# Patient Record
Sex: Female | Born: 1965 | Race: White | Hispanic: No | Marital: Married | Smoking: Never smoker
Health system: Southern US, Community
[De-identification: ages and names within clinical notes are randomized; demographics above are authoritative.]

## PROBLEM LIST (undated history)

## (undated) DIAGNOSIS — H15101 Unspecified episcleritis, right eye: Secondary | ICD-10-CM

## (undated) DIAGNOSIS — E785 Hyperlipidemia, unspecified: Secondary | ICD-10-CM

## (undated) DIAGNOSIS — G5603 Carpal tunnel syndrome, bilateral upper limbs: Secondary | ICD-10-CM

## (undated) DIAGNOSIS — N289 Disorder of kidney and ureter, unspecified: Secondary | ICD-10-CM

## (undated) DIAGNOSIS — K589 Irritable bowel syndrome without diarrhea: Secondary | ICD-10-CM

## (undated) DIAGNOSIS — N200 Calculus of kidney: Secondary | ICD-10-CM

## (undated) DIAGNOSIS — I10 Essential (primary) hypertension: Secondary | ICD-10-CM

## (undated) DIAGNOSIS — B029 Zoster without complications: Secondary | ICD-10-CM

## (undated) DIAGNOSIS — K219 Gastro-esophageal reflux disease without esophagitis: Secondary | ICD-10-CM

## (undated) DIAGNOSIS — U071 COVID-19: Secondary | ICD-10-CM

## (undated) DIAGNOSIS — O24419 Gestational diabetes mellitus in pregnancy, unspecified control: Secondary | ICD-10-CM

## (undated) DIAGNOSIS — K760 Fatty (change of) liver, not elsewhere classified: Secondary | ICD-10-CM

## (undated) DIAGNOSIS — T7840XA Allergy, unspecified, initial encounter: Secondary | ICD-10-CM

## (undated) DIAGNOSIS — M7500 Adhesive capsulitis of unspecified shoulder: Secondary | ICD-10-CM

## (undated) DIAGNOSIS — C439 Malignant melanoma of skin, unspecified: Secondary | ICD-10-CM

## (undated) DIAGNOSIS — E669 Obesity, unspecified: Secondary | ICD-10-CM

## (undated) HISTORY — DX: Carpal tunnel syndrome, bilateral upper limbs: G56.03

## (undated) HISTORY — DX: Gastro-esophageal reflux disease without esophagitis: K21.9

## (undated) HISTORY — DX: Irritable bowel syndrome, unspecified: K58.9

## (undated) HISTORY — PX: MELANOMA EXCISION: SHX5266

## (undated) HISTORY — PX: LITHOTRIPSY: SUR834

## (undated) HISTORY — DX: Gestational diabetes mellitus in pregnancy, unspecified control: O24.419

## (undated) HISTORY — DX: COVID-19: U07.1

## (undated) HISTORY — DX: Zoster without complications: B02.9

## (undated) HISTORY — DX: Unspecified episcleritis, right eye: H15.101

## (undated) HISTORY — DX: Obesity, unspecified: E66.9

## (undated) HISTORY — DX: Hyperlipidemia, unspecified: E78.5

## (undated) HISTORY — PX: CARPAL TUNNEL RELEASE: SHX101

## (undated) HISTORY — DX: Allergy, unspecified, initial encounter: T78.40XA

## (undated) HISTORY — DX: Adhesive capsulitis of unspecified shoulder: M75.00

## (undated) HISTORY — DX: Essential (primary) hypertension: I10

## (undated) HISTORY — DX: Calculus of kidney: N20.0

## (undated) HISTORY — PX: BREAST REDUCTION SURGERY: SHX8

## (undated) HISTORY — DX: Fatty (change of) liver, not elsewhere classified: K76.0

## (undated) HISTORY — PX: NASAL SINUS SURGERY: SHX719

## (undated) HISTORY — PX: TONSILLECTOMY: SUR1361

---

## 2005-06-19 ENCOUNTER — Ambulatory Visit: Payer: Self-pay | Admitting: Oncology

## 2005-06-25 ENCOUNTER — Ambulatory Visit: Payer: Self-pay | Admitting: Oncology

## 2005-06-27 ENCOUNTER — Ambulatory Visit: Payer: Self-pay | Admitting: Oncology

## 2005-07-01 ENCOUNTER — Ambulatory Visit: Payer: Self-pay | Admitting: Oncology

## 2005-07-18 ENCOUNTER — Ambulatory Visit: Payer: Self-pay | Admitting: Surgery

## 2005-08-20 ENCOUNTER — Ambulatory Visit: Payer: Self-pay | Admitting: Oncology

## 2005-09-08 ENCOUNTER — Ambulatory Visit: Payer: Self-pay | Admitting: Surgery

## 2005-09-25 ENCOUNTER — Ambulatory Visit: Payer: Self-pay | Admitting: Oncology

## 2006-02-13 ENCOUNTER — Ambulatory Visit: Payer: Self-pay | Admitting: Oncology

## 2006-02-19 ENCOUNTER — Ambulatory Visit: Payer: Self-pay | Admitting: Oncology

## 2006-03-20 ENCOUNTER — Ambulatory Visit: Payer: Self-pay | Admitting: Oncology

## 2006-08-21 ENCOUNTER — Ambulatory Visit: Payer: Self-pay | Admitting: Oncology

## 2006-08-27 ENCOUNTER — Ambulatory Visit: Payer: Self-pay | Admitting: Oncology

## 2006-09-19 ENCOUNTER — Ambulatory Visit: Payer: Self-pay | Admitting: Oncology

## 2007-02-23 ENCOUNTER — Ambulatory Visit: Payer: Self-pay | Admitting: Oncology

## 2007-02-23 ENCOUNTER — Ambulatory Visit: Payer: Self-pay | Admitting: Internal Medicine

## 2007-03-21 ENCOUNTER — Ambulatory Visit: Payer: Self-pay | Admitting: Oncology

## 2007-03-21 ENCOUNTER — Ambulatory Visit: Payer: Self-pay | Admitting: Internal Medicine

## 2007-09-14 ENCOUNTER — Ambulatory Visit: Payer: Self-pay | Admitting: Internal Medicine

## 2007-10-21 ENCOUNTER — Ambulatory Visit: Payer: Self-pay | Admitting: Oncology

## 2007-10-27 ENCOUNTER — Ambulatory Visit: Payer: Self-pay | Admitting: Oncology

## 2007-11-02 ENCOUNTER — Ambulatory Visit: Payer: Self-pay | Admitting: Oncology

## 2007-11-21 ENCOUNTER — Ambulatory Visit: Payer: Self-pay | Admitting: Oncology

## 2007-11-25 ENCOUNTER — Ambulatory Visit: Payer: Self-pay | Admitting: Gastroenterology

## 2007-11-29 ENCOUNTER — Ambulatory Visit: Payer: Self-pay | Admitting: Gastroenterology

## 2008-03-20 ENCOUNTER — Ambulatory Visit: Payer: Self-pay | Admitting: Oncology

## 2008-04-18 ENCOUNTER — Ambulatory Visit: Payer: Self-pay | Admitting: Oncology

## 2008-04-19 ENCOUNTER — Ambulatory Visit: Payer: Self-pay | Admitting: Oncology

## 2008-04-24 ENCOUNTER — Ambulatory Visit: Payer: Self-pay | Admitting: Oncology

## 2008-05-20 ENCOUNTER — Ambulatory Visit: Payer: Self-pay | Admitting: Oncology

## 2008-10-20 ENCOUNTER — Ambulatory Visit: Payer: Self-pay | Admitting: Oncology

## 2008-11-07 ENCOUNTER — Ambulatory Visit: Payer: Self-pay | Admitting: Oncology

## 2008-11-14 ENCOUNTER — Ambulatory Visit: Payer: Self-pay | Admitting: Oncology

## 2008-11-20 ENCOUNTER — Ambulatory Visit: Payer: Self-pay | Admitting: Oncology

## 2008-12-27 ENCOUNTER — Ambulatory Visit: Payer: Self-pay | Admitting: Unknown Physician Specialty

## 2009-11-22 IMAGING — CT NM PET TUM IMG RESTAG (PS) SKULL BASE T - THIGH
5 series · 25 of 25 positions shown · non-contrast
Comparison: none

REASON FOR EXAM: Melanoma  Rt Upper Extr
COMMENTS:

PROCEDURE:     PET - PET/CT MELANOMA RESTG WB  - November 07, 2008  [DATE]
RESULT:
HISTORY: Melanoma.

[Series 4: ct wb 3.0 b30f · axial · 3.0mm · 0.98mm/px · z∈[-1254,-268]mm · 11 of 494 slices shown]
[im 1/494]
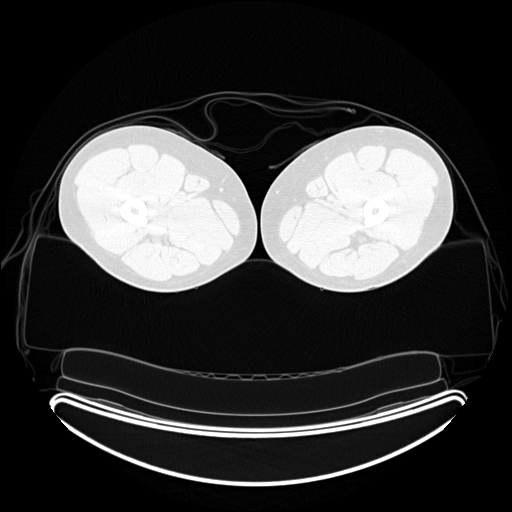
[im 50/494]
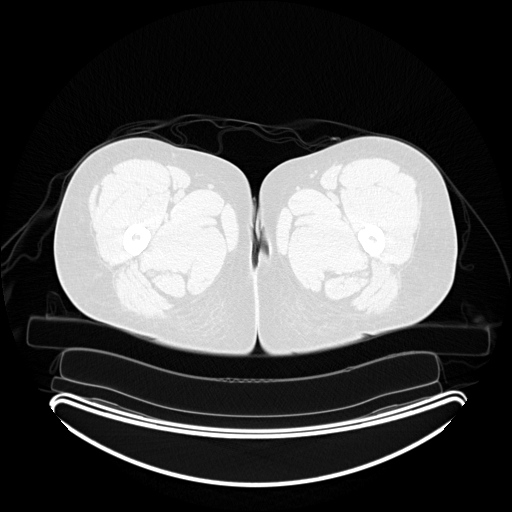
[im 99/494]
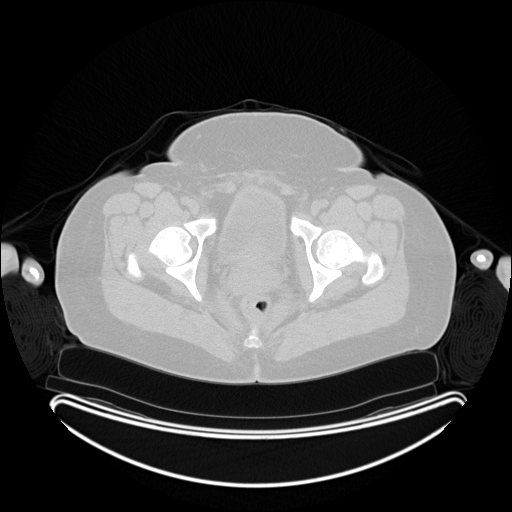
[im 148/494]
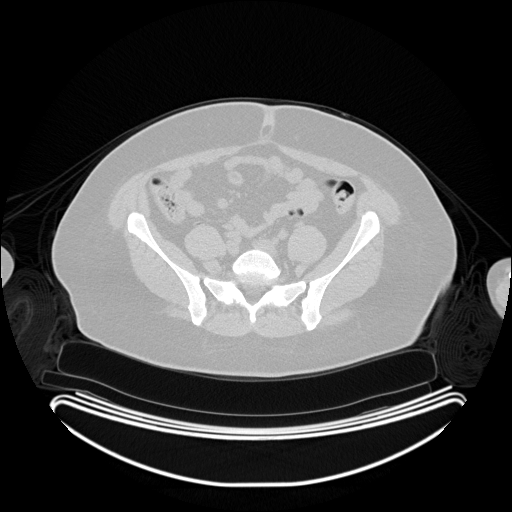
[im 198/494]
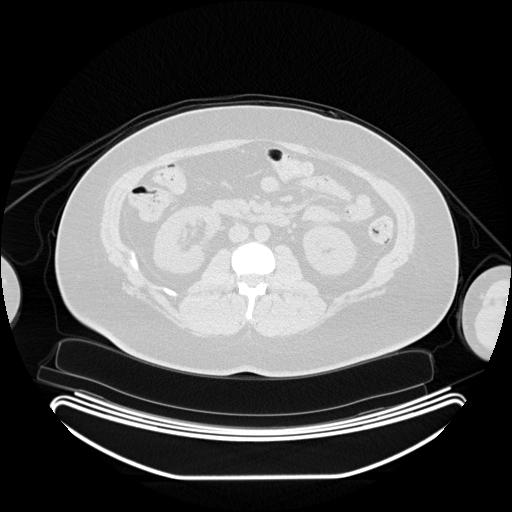
[im 247/494]
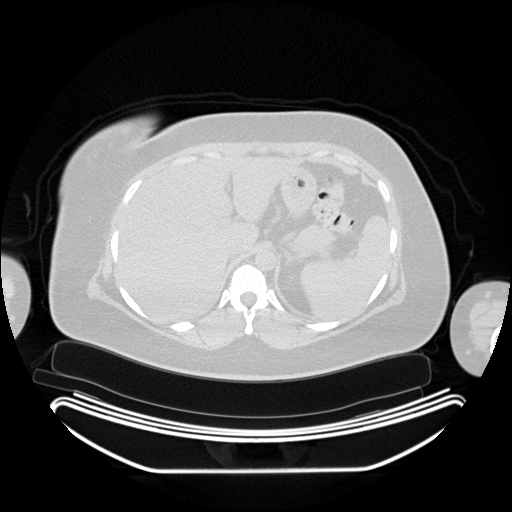
[im 296/494]
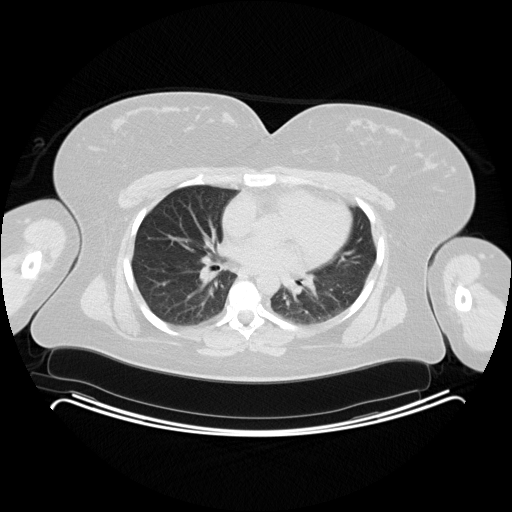
[im 346/494]
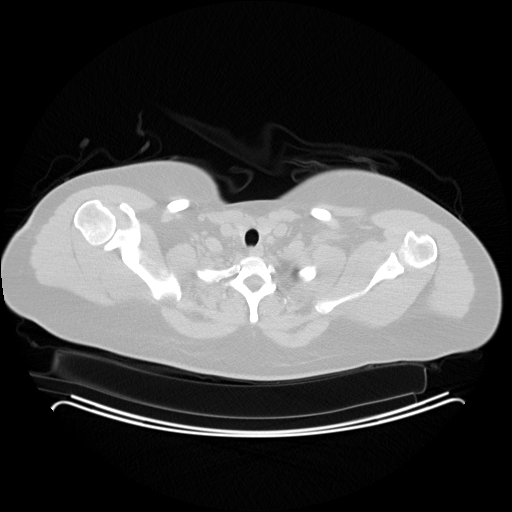
[im 395/494]
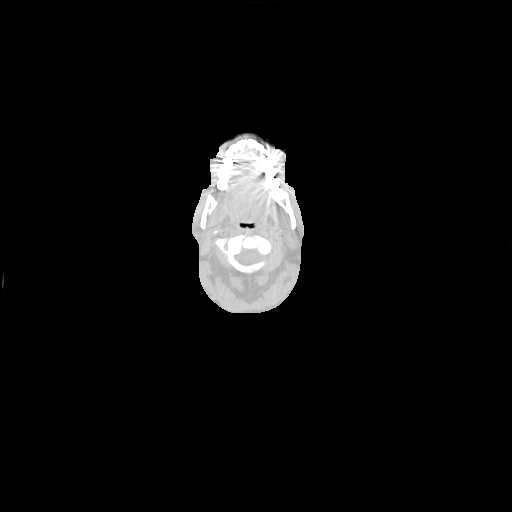
[im 444/494  brain]
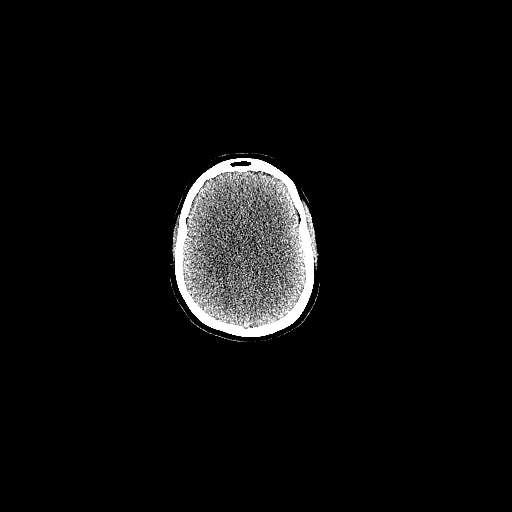
[im 494/494]
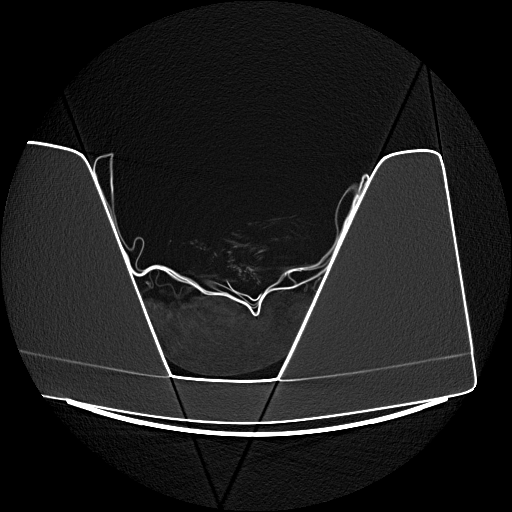

[Series 102: pet wb · axial · 5.0mm · 4.07mm/px · z∈[-1252,-268]mm · 7 of 329 slices shown]
[im 1/329]
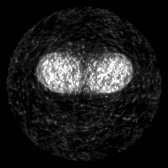
[im 55/329]
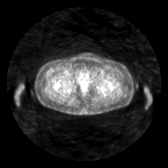
[im 110/329]
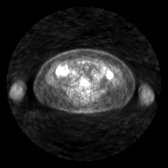
[im 165/329]
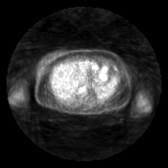
[im 219/329]
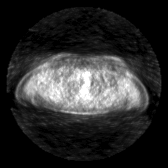
[im 274/329]
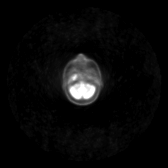
[im 329/329]
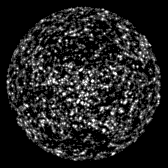

[Series 603: pet axial · 4 of 184 slices shown]
[im 1/184]
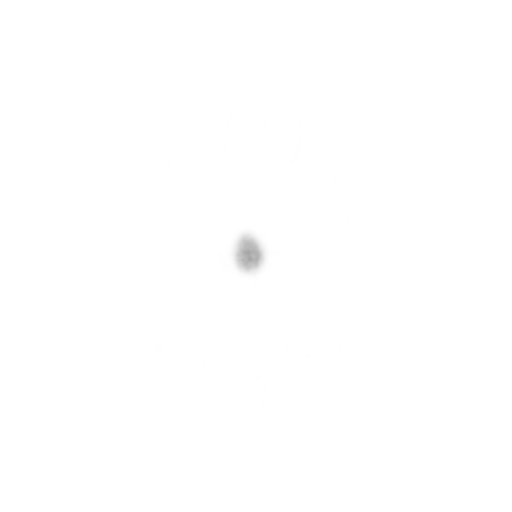
[im 62/184]
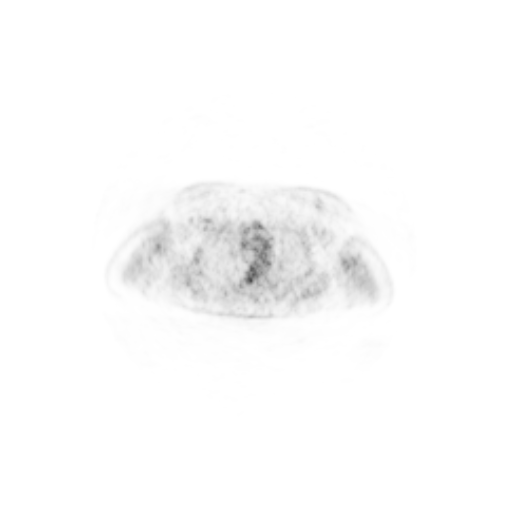
[im 123/184]
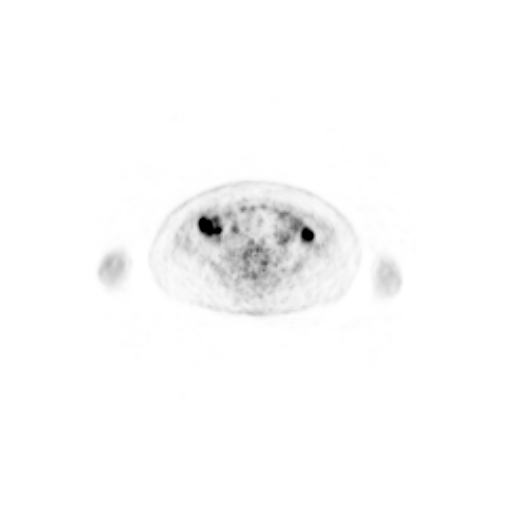
[im 184/184]
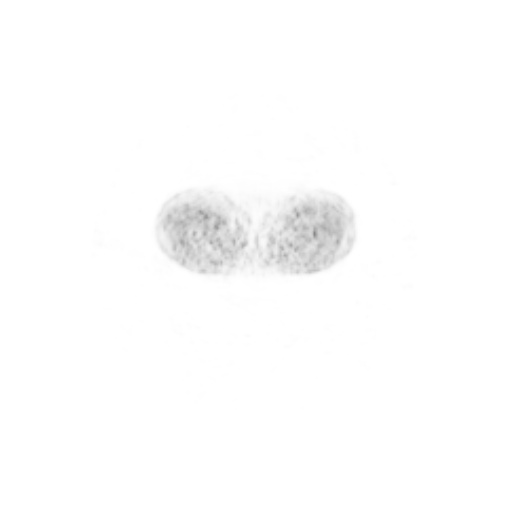

[Series 604: pet coronal · 1 of 52 slices shown]
[im 1/52]
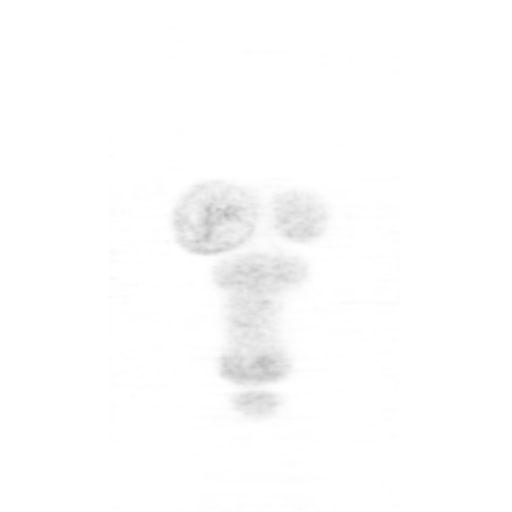

[Series 605: pet sagittal · 2 of 116 slices shown]
[im 1/116]
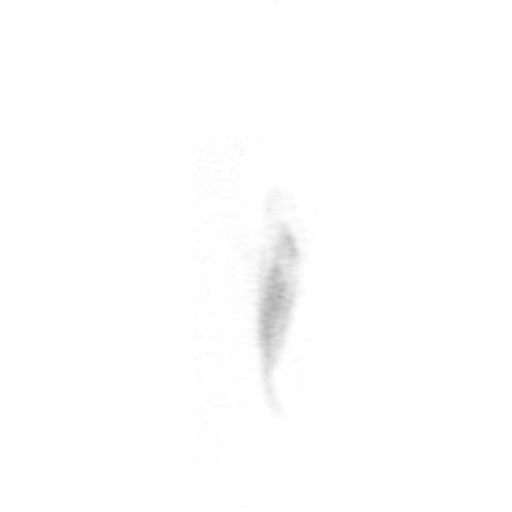
[im 116/116]
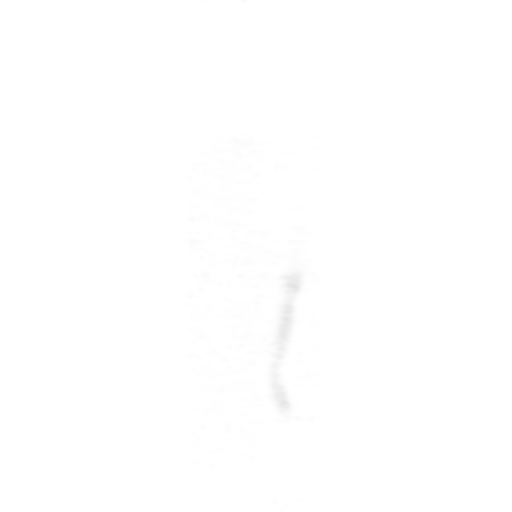

[25 of 25 positions shown; findings below may reference images not displayed]

COMPARISON STUDIES:  PET CT of 10/27/07.

PROCEDURE AND FINDINGS:   Following determination of fasting blood sugar of
93 mg/dl, 13.4 mCi of F18 FDG was administered to the patient and PET CT was
obtained.  Neck, chest, abdomen and pelvis are unremarkable. Lower
extremities are unremarkable.

Normal bowel activity is noted.
IMPRESSION: Normal exam.

## 2009-12-31 ENCOUNTER — Ambulatory Visit: Payer: Self-pay | Admitting: Unknown Physician Specialty

## 2011-01-14 ENCOUNTER — Ambulatory Visit: Payer: Self-pay | Admitting: Family Medicine

## 2012-09-20 ENCOUNTER — Ambulatory Visit: Payer: Self-pay | Admitting: Medical

## 2013-12-06 ENCOUNTER — Ambulatory Visit: Payer: Self-pay | Admitting: Physician Assistant

## 2014-11-03 ENCOUNTER — Ambulatory Visit: Payer: Self-pay

## 2015-12-12 ENCOUNTER — Encounter: Payer: Self-pay | Admitting: Emergency Medicine

## 2015-12-12 ENCOUNTER — Ambulatory Visit
Admission: EM | Admit: 2015-12-12 | Discharge: 2015-12-12 | Disposition: A | Discharge status: Home / Self Care | Payer: BLUE CROSS/BLUE SHIELD | Attending: Family Medicine | Admitting: Family Medicine

## 2015-12-12 DIAGNOSIS — B349 Viral infection, unspecified: Secondary | ICD-10-CM | POA: Diagnosis not present

## 2015-12-12 HISTORY — DX: Malignant melanoma of skin, unspecified: C43.9

## 2015-12-12 HISTORY — DX: Disorder of kidney and ureter, unspecified: N28.9

## 2015-12-12 LAB — RAPID INFLUENZA A&B ANTIGENS
Influenza A (ARMC): NOT DETECTED
Influenza B (ARMC): NOT DETECTED

## 2015-12-12 LAB — RAPID STREP SCREEN (MED CTR MEBANE ONLY): Streptococcus, Group A Screen (Direct): NEGATIVE

## 2015-12-12 MED ORDER — HYDROCOD POLST-CPM POLST ER 10-8 MG/5ML PO SUER: 5.0000 mL | Freq: Two times a day (BID) | ORAL | Status: DC

## 2015-12-12 MED ORDER — OSELTAMIVIR PHOSPHATE 75 MG PO CAPS: 75.0000 mg | ORAL_CAPSULE | Freq: Two times a day (BID) | ORAL | Status: DC

## 2015-12-12 NOTE — ED Provider Notes (Signed)
CSN: [id number]     Arrival date & time 12/12/15  1326 History   First MD Initiated Contact with Patient 12/12/15 1515     Chief Complaint  Patient presents with  . Sore Throat  . Headache   (Consider location/radiation/quality/duration/timing/severity/associated sxs/prior Treatment) Patient is a 50 y.o. female presenting with URI. The history is provided by the patient.  URI Presenting symptoms: congestion, cough, fatigue, fever and sore throat   Severity:  Moderate Onset quality:  Sudden Duration:  1 day Timing:  Constant Progression:  Worsening Chronicity:  New Relieved by:  None tried Associated symptoms: arthralgias, headaches and myalgias   Associated symptoms: no neck pain, no sinus pain, no swollen glands and no wheezing   Risk factors: sick contacts   Risk factors: not elderly, no chronic cardiac disease, no chronic kidney disease, no chronic respiratory disease, no diabetes mellitus, no immunosuppression, no recent illness and no recent travel     Past Medical History  Diagnosis Date  . Melanoma ([place])   . Renal disorder    Past Surgical History  Procedure Laterality Date  . Cesarean section    . Tonsillectomy    . Nasal sinus surgery     History reviewed. No pertinent family history. Social History  Substance Use Topics  . Smoking status: Never Smoker   . Smokeless tobacco: None  . Alcohol Use: No   OB History    No data available     Review of Systems  Constitutional: Positive for fever and fatigue.  HENT: Positive for congestion and sore throat.   Respiratory: Positive for cough. Negative for wheezing.   Musculoskeletal: Positive for myalgias and arthralgias. Negative for neck pain.  Neurological: Positive for headaches.    Allergies  Review of patient's allergies indicates no known allergies.  Home Medications   Prior to Admission medications   Medication Sig Start Date End Date Taking? Authorizing Provider  cetirizine (ZYRTEC) 10 MG tablet  Take 10 mg by mouth daily.   Yes Historical Provider, MD  Fluoxetine HCl, PMDD, (SARAFEM) 10 MG TABS Take 1 tablet by mouth daily.   Yes Historical Provider, MD  Multiple Vitamin (MULTIVITAMIN) tablet Take 1 tablet by mouth daily.   Yes Historical Provider, MD  omega-3 acid ethyl esters (LOVAZA) 1 g capsule Take 1 g by mouth 2 (two) times daily.   Yes Historical Provider, MD  chlorpheniramine-HYDROcodone (TUSSIONEX PENNKINETIC ER) 10-8 MG/5ML SUER Take 5 mLs by mouth 2 (two) times daily. 12/12/15   Norval Gable, MD  oseltamivir (TAMIFLU) 75 MG capsule Take 1 capsule (75 mg total) by mouth 2 (two) times daily. 12/12/15   Norval Gable, MD   Meds Ordered and Administered this Visit  Medications - No data to display  BP 129/75 mmHg  Pulse 115  Temp(Src) 100.4 F (38 C) (Tympanic)  Resp 16  Ht 5' (1.524 m)  Wt 176 lb (79.833 kg)  BMI 34.37 kg/m2  SpO2 97%  LMP 11/24/2015 (Exact Date) No data found.   Physical Exam  Constitutional: She appears well-developed and well-nourished. No distress.  HENT:  Head: Normocephalic and atraumatic.  Right Ear: Tympanic membrane, external ear and ear canal normal.  Left Ear: Tympanic membrane, external ear and ear canal normal.  Nose: Rhinorrhea present. No nose lacerations, sinus tenderness, nasal deformity, septal deviation or nasal septal hematoma. No epistaxis.  No foreign bodies.  Mouth/Throat: Uvula is midline and mucous membranes are normal. Posterior oropharyngeal erythema present. No oropharyngeal exudate, posterior oropharyngeal edema or tonsillar  abscesses.  Eyes: Conjunctivae and EOM are normal. Pupils are equal, round, and reactive to light. Right eye exhibits no discharge. Left eye exhibits no discharge. No scleral icterus.  Neck: Normal range of motion. Neck supple. No thyromegaly present.  Cardiovascular: Normal rate, regular rhythm and normal heart sounds.   Pulmonary/Chest: Effort normal and breath sounds normal. No respiratory  distress. She has no wheezes. She has no rales.  Lymphadenopathy:    She has no cervical adenopathy.  Skin: She is not diaphoretic.  Nursing note and vitals reviewed.   ED Course  Procedures (including critical care time)  Labs Review Labs Reviewed  RAPID INFLUENZA A&B ANTIGENS ([facility] ONLY)  RAPID STREP SCREEN (NOT AT [facility])    Imaging Review No results found.   Visual Acuity Review  Right Eye Distance:   Left Eye Distance:   Bilateral Distance:    Right Eye Near:   Left Eye Near:    Bilateral Near:         MDM   1. Viral syndrome   (likely influenza, based on clinical presentation)   New Prescriptions   CHLORPHENIRAMINE-HYDROCODONE (TUSSIONEX PENNKINETIC ER) 10-8 MG/5ML SUER    Take 5 mLs by mouth 2 (two) times daily.   OSELTAMIVIR (TAMIFLU) 75 MG CAPSULE    Take 1 capsule (75 mg total) by mouth 2 (two) times daily.   1. Lab results and diagnosis reviewed with patient 2. rx as per orders above; reviewed possible side effects, interactions, risks and benefits  3. Recommend supportive treatment with rest, increased fluids, otc analgesics 4. Follow-up prn if symptoms worsen or don't improve    Norval Gable, MD 12/12/15 1531

## 2015-12-12 NOTE — ED Notes (Signed)
Patient sore throat, HAs, fever, and bodyaches that started last night.

## 2016-08-08 ENCOUNTER — Encounter: Payer: Self-pay | Admitting: Emergency Medicine

## 2016-08-08 ENCOUNTER — Ambulatory Visit
Admission: EM | Admit: 2016-08-08 | Discharge: 2016-08-08 | Disposition: A | Discharge status: Home / Self Care | Payer: Managed Care, Other (non HMO) | Attending: Family Medicine | Admitting: Family Medicine

## 2016-08-08 DIAGNOSIS — J069 Acute upper respiratory infection, unspecified: Secondary | ICD-10-CM

## 2016-08-08 DIAGNOSIS — H6502 Acute serous otitis media, left ear: Secondary | ICD-10-CM | POA: Diagnosis not present

## 2016-08-08 MED ORDER — HYDROCOD POLST-CPM POLST ER 10-8 MG/5ML PO SUER: 5.0000 mL | Freq: Two times a day (BID) | ORAL | 0 refills | Status: DC | PRN

## 2016-08-08 MED ORDER — AMOXICILLIN 875 MG PO TABS: 875.0000 mg | ORAL_TABLET | Freq: Two times a day (BID) | ORAL | 0 refills | Status: DC

## 2016-08-08 NOTE — ED Provider Notes (Signed)
[facility]    CSN: [id number] Arrival date & time: 08/08/16  0903     History   Chief Complaint Chief Complaint  Patient presents with  . Facial Pain  . Cough    HPI Angela Simon is a 50 y.o. female.   The history is provided by the patient.  Cough  Associated symptoms: ear pain and fever   Associated symptoms: no headaches, no myalgias and no wheezing   URI  Presenting symptoms: congestion, cough, ear pain and fever   Severity:  Moderate Onset quality:  Sudden Duration:  5 days Timing:  Constant Progression:  Worsening Chronicity:  New Relieved by:  Nothing Ineffective treatments:  OTC medications Associated symptoms: no arthralgias, no headaches, no myalgias, no neck pain, no sinus pain, no sneezing, no swollen glands and no wheezing   Risk factors: sick contacts   Risk factors: not elderly, no chronic cardiac disease, no chronic kidney disease, no chronic respiratory disease, no diabetes mellitus, no immunosuppression, no recent illness and no recent travel     Past Medical History:  Diagnosis Date  . Melanoma ([place])   . Renal disorder     There are no active problems to display for this patient.   Past Surgical History:  Procedure Laterality Date  . CESAREAN SECTION    . NASAL SINUS SURGERY    . TONSILLECTOMY      OB History    No data available       Home Medications    Prior to Admission medications   Medication Sig Start Date End Date Taking? Authorizing Provider  amoxicillin (AMOXIL) 875 MG tablet Take 1 tablet (875 mg total) by mouth 2 (two) times daily. 08/08/16   Norval Gable, MD  cetirizine (ZYRTEC) 10 MG tablet Take 10 mg by mouth daily.    Historical Provider, MD  chlorpheniramine-HYDROcodone (TUSSIONEX PENNKINETIC ER) 10-8 MG/5ML SUER Take 5 mLs by mouth every 12 (twelve) hours as needed. 08/08/16   Norval Gable, MD  Fluoxetine HCl, PMDD, (SARAFEM) 10 MG TABS Take 1 tablet by mouth daily.    Historical Provider,  MD  Multiple Vitamin (MULTIVITAMIN) tablet Take 1 tablet by mouth daily.    Historical Provider, MD  omega-3 acid ethyl esters (LOVAZA) 1 g capsule Take 1 g by mouth 2 (two) times daily.    Historical Provider, MD  oseltamivir (TAMIFLU) 75 MG capsule Take 1 capsule (75 mg total) by mouth 2 (two) times daily. 12/12/15   Norval Gable, MD    Family History History reviewed. No pertinent family history.  Social History Social History  Substance Use Topics  . Smoking status: Never Smoker  . Smokeless tobacco: Never Used  . Alcohol use No     Allergies   Review of patient's allergies indicates no known allergies.   Review of Systems Review of Systems  Constitutional: Positive for fever.  HENT: Positive for congestion and ear pain. Negative for sneezing.   Respiratory: Positive for cough. Negative for wheezing.   Musculoskeletal: Negative for arthralgias, myalgias and neck pain.  Neurological: Negative for headaches.     Physical Exam Triage Vital Signs ED Triage Vitals  Enc Vitals Group     BP 08/08/16 0940 (!) 150/85     Pulse Rate 08/08/16 0940 83     Resp 08/08/16 0940 16     Temp 08/08/16 0940 99.3 F (37.4 C)     Temp Source 08/08/16 0940 Tympanic     SpO2 08/08/16 0940 100 %  Weight 08/08/16 0939 175 lb (79.4 kg)     Height 08/08/16 0939 5' (1.524 m)     Head Circumference --      Peak Flow --      Pain Score 08/08/16 0941 5     Pain Loc --      Pain Edu? --      Excl. in [place]? --    No data found.   Updated Vital Signs BP (!) 150/85 (BP Location: Left Arm)   Pulse 83   Temp 99.3 F (37.4 C) (Tympanic)   Resp 16   Ht 5' (1.524 m)   Wt 175 lb (79.4 kg)   LMP 07/14/2016   SpO2 100%   BMI 34.18 kg/m   Visual Acuity Right Eye Distance:   Left Eye Distance:   Bilateral Distance:    Right Eye Near:   Left Eye Near:    Bilateral Near:     Physical Exam  Constitutional: She appears well-developed and well-nourished. No distress.  HENT:  Head:  Normocephalic and atraumatic.  Right Ear: Tympanic membrane, external ear and ear canal normal.  Left Ear: External ear and ear canal normal. Tympanic membrane is injected, erythematous and bulging.  Nose: No mucosal edema, rhinorrhea, nose lacerations, sinus tenderness, nasal deformity, septal deviation or nasal septal hematoma. No epistaxis.  No foreign bodies. Right sinus exhibits no maxillary sinus tenderness and no frontal sinus tenderness. Left sinus exhibits no maxillary sinus tenderness and no frontal sinus tenderness.  Mouth/Throat: Uvula is midline, oropharynx is clear and moist and mucous membranes are normal. No oropharyngeal exudate.  Eyes: Conjunctivae and EOM are normal. Pupils are equal, round, and reactive to light. Right eye exhibits no discharge. Left eye exhibits no discharge. No scleral icterus.  Neck: Normal range of motion. Neck supple. No thyromegaly present.  Cardiovascular: Normal rate, regular rhythm and normal heart sounds.   Pulmonary/Chest: Effort normal and breath sounds normal. No respiratory distress. She has no wheezes. She has no rales.  Lymphadenopathy:    She has no cervical adenopathy.  Skin: She is not diaphoretic.  Nursing note and vitals reviewed.    UC Treatments / Results  Labs (all labs ordered are listed, but only abnormal results are displayed) Labs Reviewed - No data to display  EKG  EKG Interpretation None       Radiology No results found.  Procedures Procedures (including critical care time)  Medications Ordered in UC Medications - No data to display   Initial Impression / Assessment and Plan / UC Course  I have reviewed the triage vital signs and the nursing notes.  Pertinent labs & imaging results that were available during my care of the patient were reviewed by me and considered in my medical decision making (see chart for details).  Clinical Course      Final Clinical Impressions(s) / UC Diagnoses   Final  diagnoses:  Acute serous otitis media of left ear, recurrence not specified  Acute upper respiratory infection    New Prescriptions Discharge Medication List as of 08/08/2016 10:49 AM    START taking these medications   Details  amoxicillin (AMOXIL) 875 MG tablet Take 1 tablet (875 mg total) by mouth 2 (two) times daily., Starting Fri 08/08/2016, Normal    chlorpheniramine-HYDROcodone (TUSSIONEX PENNKINETIC ER) 10-8 MG/5ML SUER Take 5 mLs by mouth every 12 (twelve) hours as needed., Starting Fri 08/08/2016, Normal       1. diagnosis reviewed with patient 2. rx as per orders  above; reviewed possible side effects, interactions, risks and benefits  3. Follow-up prn if symptoms worsen or don't improve   Norval Gable, MD 08/08/16 1104

## 2016-08-08 NOTE — ED Triage Notes (Signed)
Patient c/o cough, chest congestion, sinus pressure for 2 days. Patient reports low grade fevers.

## 2016-08-12 ENCOUNTER — Telehealth: Payer: Self-pay

## 2016-08-12 NOTE — Telephone Encounter (Signed)
Courtesy call back completed today after patient's visit at [facility]. Patient improved and will call back with any questions or concerns.  

## 2016-11-28 ENCOUNTER — Ambulatory Visit (INDEPENDENT_AMBULATORY_CARE_PROVIDER_SITE_OTHER): Payer: Managed Care, Other (non HMO) | Admitting: Primary Care

## 2016-11-28 ENCOUNTER — Encounter: Payer: Self-pay | Admitting: Primary Care

## 2016-11-28 VITALS — BP 152/98 | HR 84 | Temp 98.0°F | Ht 60.0 in | Wt 175.8 lb

## 2016-11-28 DIAGNOSIS — F411 Generalized anxiety disorder: Secondary | ICD-10-CM | POA: Diagnosis not present

## 2016-11-28 DIAGNOSIS — I10 Essential (primary) hypertension: Secondary | ICD-10-CM | POA: Insufficient documentation

## 2016-11-28 DIAGNOSIS — K219 Gastro-esophageal reflux disease without esophagitis: Secondary | ICD-10-CM

## 2016-11-28 MED ORDER — LISINOPRIL 10 MG PO TABS: 10.0000 mg | ORAL_TABLET | Freq: Every day | ORAL | 0 refills | Status: DC

## 2016-11-28 NOTE — Progress Notes (Signed)
Pre visit review using our clinic review tool, if applicable. No additional management support is needed unless otherwise documented below in the visit note. 

## 2016-11-28 NOTE — Patient Instructions (Signed)
Start lisinopril 10 mg for high blood pressure. Take 1 tablet by mouth once daily.  Check your blood pressure daily, around the same time of day, for the next 3 weeks.  Ensure that you have rested for 30 minutes prior to checking your blood pressure. Record your readings and bring them to your next visit.  Schedule a follow up appointment in 3 weeks for re-evaluation of your blood pressure.  It was a pleasure to meet you today! Please don't hesitate to call me with any questions. Welcome to [facility]!  DASH Eating Plan DASH stands for "Dietary Approaches to Stop Hypertension." The DASH eating plan is a healthy eating plan that has been shown to reduce high blood pressure (hypertension). Additional health benefits may include reducing the risk of type 2 diabetes mellitus, heart disease, and stroke. The DASH eating plan may also help with weight loss. What do I need to know about the DASH eating plan? For the DASH eating plan, you will follow these general guidelines:  Choose foods with less than 150 milligrams of sodium per serving (as listed on the food label).  Use salt-free seasonings or herbs instead of table salt or sea salt.  Check with your health care provider or pharmacist before using salt substitutes.  Eat lower-sodium products. These are often labeled as "low-sodium" or "no salt added."  Eat fresh foods. Avoid eating a lot of canned foods.  Eat more vegetables, fruits, and low-fat dairy products.  Choose whole grains. Look for the word "whole" as the first word in the ingredient list.  Choose fish and skinless chicken or [place] more often than red meat. Limit fish, poultry, and meat to 6 oz (170 g) each day.  Limit sweets, desserts, sugars, and sugary drinks.  Choose heart-healthy fats.  Eat more home-cooked food and less restaurant, buffet, and fast food.  Limit fried foods.  Do not fry foods. Cook foods using methods such as baking, boiling, grilling, and broiling  instead.  When eating at a restaurant, ask that your food be prepared with less salt, or no salt if possible. What foods can I eat? Seek help from a dietitian for individual calorie needs. Grains  Whole grain or whole wheat bread. Brown rice. Whole grain or whole wheat pasta. Quinoa, bulgur, and whole grain cereals. Low-sodium cereals. Corn or whole wheat flour tortillas. Whole grain cornbread. Whole grain crackers. Low-sodium crackers. Vegetables  Fresh or frozen vegetables (raw, steamed, roasted, or grilled). Low-sodium or reduced-sodium tomato and vegetable juices. Low-sodium or reduced-sodium tomato sauce and paste. Low-sodium or reduced-sodium canned vegetables. Fruits  All fresh, canned (in natural juice), or frozen fruits. Meat and Other Protein Products  Ground beef (85% or leaner), grass-fed beef, or beef trimmed of fat. Skinless chicken or [place]. Ground chicken or [place]. Pork trimmed of fat. All fish and seafood. Eggs. Dried beans, peas, or lentils. Unsalted nuts and seeds. Unsalted canned beans. Dairy  Low-fat dairy products, such as skim or 1% milk, 2% or reduced-fat cheeses, low-fat ricotta or cottage cheese, or plain low-fat yogurt. Low-sodium or reduced-sodium cheeses. Fats and Oils  Tub margarines without trans fats. Light or reduced-fat mayonnaise and salad dressings (reduced sodium). Avocado. Safflower, olive, or canola oils. Natural peanut or almond butter. Other  Unsalted popcorn and pretzels. The items listed above may not be a complete list of recommended foods or beverages. Contact your dietitian for more options.  What foods are not recommended? Grains  White bread. White pasta. White rice. Refined cornbread. Bagels  and croissants. Crackers that contain trans fat. Vegetables  Creamed or fried vegetables. Vegetables in a cheese sauce. Regular canned vegetables. Regular canned tomato sauce and paste. Regular tomato and vegetable juices. Fruits  Canned fruit in light  or heavy syrup. Fruit juice. Meat and Other Protein Products  Fatty cuts of meat. Ribs, chicken wings, bacon, sausage, bologna, salami, chitterlings, fatback, hot dogs, bratwurst, and packaged luncheon meats. Salted nuts and seeds. Canned beans with salt. Dairy  Whole or 2% milk, cream, half-and-half, and cream cheese. Whole-fat or sweetened yogurt. Full-fat cheeses or blue cheese. Nondairy creamers and whipped toppings. Processed cheese, cheese spreads, or cheese curds. Condiments  Onion and garlic salt, seasoned salt, table salt, and sea salt. Canned and packaged gravies. Worcestershire sauce. Tartar sauce. Barbecue sauce. Teriyaki sauce. Soy sauce, including reduced sodium. Steak sauce. Fish sauce. Oyster sauce. Cocktail sauce. Horseradish. Ketchup and mustard. Meat flavorings and tenderizers. Bouillon cubes. Hot sauce. Tabasco sauce. Marinades. Taco seasonings. Relishes. Fats and Oils  Butter, stick margarine, lard, shortening, ghee, and bacon fat. Coconut, palm kernel, or palm oils. Regular salad dressings. Other  Pickles and olives. Salted popcorn and pretzels. The items listed above may not be a complete list of foods and beverages to avoid. Contact your dietitian for more information.  Where can I find more information? National Heart, Lung, and Blood Institute: travelstabloid.com This information is not intended to replace advice given to you by your health care provider. Make sure you discuss any questions you have with your health care provider. Document Released: 09/25/2011 Document Revised: 03/13/2016 Document Reviewed: 08/10/2013 Elsevier Interactive Patient Education  2017 [employer].

## 2016-11-28 NOTE — Progress Notes (Signed)
Subjective:    Patient ID: Angela Simon, female    DOB: 1965-11-08, 51 y.o.   MRN: [id number]  HPI  Angela Simon is a 51 year old female who presents today to establish care and discuss the problems mentioned below. Will obtain old records.  1) Elevated Blood Pressure: She's been checking her BP at home/at [employer] for the past 2 weeks. Her BP is running 140-150's/80's. She's noticed symptoms of pulsation to her lower extremities, lightheadedness, headaches, seeing spots, difficulty sleeping for the past several months. Her BP in the office today is 152/98. She's recently changed her diet and has been eating healthier for the past 3 weeks. She has a family history of hypertension and heart disease in her mother. She has no prior history of hypertension.  2) Generalized Anxiety Disorder: Diagnosed years ago. Currently managed on fluoxetine 20 mg that was initiated per GYN for PMS. She does have difficulty sleeping, more recently. GAD 7 score of 17. She is under a lot of stress as she's had 4 deaths in her family, both children have married in 2017, she is a Armed forces operational officer, and her sister was recently diagnosed with breast cancer.  3) GERD: Diagnosed years ago. She will experience esophageal burning without her medication. Currently managed on omeprazole 10 mg. History of IBS that was diagnosed after full work up for abdominal symptoms. Work up included colonoscopy, CT scans, trail of various medications. Overall her abdominal symptoms have improved since she improved her diet.  Review of Systems  Constitutional: Positive for fatigue.  Eyes: Positive for visual disturbance.  Respiratory: Negative for shortness of breath.   Cardiovascular: Negative for chest pain.  Gastrointestinal:       Gerd  Neurological: Positive for light-headedness and headaches.  Psychiatric/Behavioral: The patient is nervous/anxious.        Past Medical History:  Diagnosis Date  . Essential hypertension   .  GERD (gastroesophageal reflux disease)   . Gestational diabetes   . IBS (irritable bowel syndrome)   . Melanoma ([place])   . Renal disorder      Social History   Social History  . Marital status: Married    Spouse name: N/A  . Number of children: N/A  . Years of education: N/A   Occupational History  . Not on file.   Social History Main Topics  . Smoking status: Never Smoker  . Smokeless tobacco: Never Used  . Alcohol use No  . Drug use: No  . Sexual activity: Not on file   Other Topics Concern  . Not on file   Social History Narrative   Married.   2 children.   No grandchildren.   Self employed.   Enjoys shopping, decorating.        Past Surgical History:  Procedure Laterality Date  . BREAST REDUCTION SURGERY    . CESAREAN SECTION    . MELANOMA EXCISION     Right arm  . NASAL SINUS SURGERY    . TONSILLECTOMY      Family History  Problem Relation Age of Onset  . Heart attack Mother   . Hypertension Mother   . Diabetes Father   . Prostate cancer Father   . Breast cancer Sister     No Known Allergies  Current Outpatient Prescriptions on File Prior to Visit  Medication Sig Dispense Refill  . cetirizine (ZYRTEC) 10 MG tablet Take 10 mg by mouth daily.    . Multiple Vitamin (MULTIVITAMIN) tablet Take 1 tablet  by mouth daily.     No current facility-administered medications on file prior to visit.     BP (!) 152/98   Pulse 84   Temp 98 F (36.7 C) (Oral)   Ht 5' (1.524 m)   Wt 175 lb 12.8 oz (79.7 kg)   LMP 11/20/2016   SpO2 98%   BMI 34.33 kg/m    Objective:   Physical Exam  Constitutional: She appears well-nourished.  Neck: Neck supple.  Cardiovascular: Normal rate and regular rhythm.   Pulmonary/Chest: Effort normal and breath sounds normal.  Skin: Skin is warm and dry.  Psychiatric: She has a normal mood and affect.          Assessment & Plan:

## 2016-11-28 NOTE — Assessment & Plan Note (Signed)
Stable on omeprazole 10 mg. Emphasized weight loss and avoidance of triggers. Continue same.

## 2016-11-28 NOTE — Assessment & Plan Note (Signed)
Uncontrolled based of of GAD 7 score of 17 today. Will have her continue fluoxetine 20 mg for now. Will gain control of her BP which seems to be part of the cause for increased anxiety. Consider increasing fluoxetine if no improvement after stabilization of BP.

## 2016-11-28 NOTE — Assessment & Plan Note (Signed)
Two documented readings of high BP, also with home readings above goal. Suspect symptoms related to uncontrolled BP and will treat. Rx for lisinopril sent to pharmacy. Information regarding DASH diet provided. Encouraged continued weight loss through healthy diet and exercise. Follow up in 3 weeks for recheck and labs.

## 2016-12-05 ENCOUNTER — Telehealth: Payer: Self-pay

## 2016-12-05 NOTE — Telephone Encounter (Signed)
Please have her continue to monitor her BP and get her in for follow up on Friday the 23rd if possible. Sometimes it takes 2-3 weeks for BP to adjust.

## 2016-12-05 NOTE — Telephone Encounter (Signed)
Pt left v/m; pt was seen 11/28/16; pt was supposed to monitor her BP on 12/02/16 BP was 146/84 and today BP 145/89. Pt still feels the same symptoms when seen. Pt did not know if something needed to be done or just continue monitoring BP until seen 12/19/16. [facility] [place].

## 2016-12-05 NOTE — Telephone Encounter (Signed)
Message left for patient to return my call.  

## 2016-12-05 NOTE — Telephone Encounter (Signed)
Spoken and notified patient of Kate's comments. Patient verbalized understanding.  Patient is schedule on 12/12/2016

## 2016-12-08 ENCOUNTER — Telehealth: Payer: Self-pay | Admitting: Primary Care

## 2016-12-08 NOTE — Telephone Encounter (Signed)
Patient Name: Angela Simon DOB: 1966/04/20 Initial Comment Caller states, his wife is being treated for high blood pressure - low dose bp rx. The bp is still elevated with anxiety. Not sure why it is still high. She has a sinus headache. Not taking her bp, nerves. Verified MD Anda Kraft not listed. She did take one of his rx. instead of her own. Nurse Assessment Nurse: Ronnald Ramp, RN, Miranda Date/Time (Eastern Time): 12/08/2016 9:35:14 AM Confirm and document reason for call. If symptomatic, describe symptoms. ---Spoke with the pt. She states she was started on blood pressure medication 10 days ago. She called the office on Friday because it her BP is still high and they told her that it could take a few weeks for the BP to come down. They did move her appt up 1 week, she has an appt on Friday. She called the nurse line over the weekend because it was higher. Advised to be seen within 72 hrs. Also she has been very anxious about her BP. Sunday morning instead of taking her medication, she took 1/2 of her husband's medication (Lisinopril 20 mg\HCTZ). Does the patient have any new or worsening symptoms? ---Yes Will a triage be completed? ---Yes Related visit to physician within the last 2 weeks? ---Yes Does the PT have any chronic conditions? (i.e. diabetes, asthma, etc.) ---Yes List chronic conditions. ---HTN, Anxiety Is the patient pregnant or possibly pregnant? (Ask all females between the ages of 52-55) ---No Is this a behavioral health or substance abuse call? ---No Guidelines Guideline Title Affirmed Question Affirmed Notes High Blood Pressure [1] BP # 140/90 AND [2] taking BP medications Anxiety and Panic Attack Symptoms interfere with sleep or daily activities Sinus Pain or Congestion [1] Sinus congestion as part of a cold AND [2] present < 10 days (all triage questions negative) Final Disposition User [place], RN, Miranda Comments Office already scheduled pt an appt  for tomorrow at 3:45pm with Alma Friendly Disagree/Comply: Comply

## 2016-12-08 NOTE — Telephone Encounter (Signed)
Patient has appointment to be seen tomorrow by Alma Friendly, NP at 3:45pm.  Forwarded info to provider.

## 2016-12-08 NOTE — Telephone Encounter (Signed)
Noted  

## 2016-12-09 ENCOUNTER — Ambulatory Visit (INDEPENDENT_AMBULATORY_CARE_PROVIDER_SITE_OTHER): Payer: Managed Care, Other (non HMO) | Admitting: Primary Care

## 2016-12-09 ENCOUNTER — Encounter: Payer: Self-pay | Admitting: Primary Care

## 2016-12-09 VITALS — BP 146/100 | HR 88 | Temp 97.6°F | Ht 60.0 in | Wt 173.1 lb

## 2016-12-09 DIAGNOSIS — I10 Essential (primary) hypertension: Secondary | ICD-10-CM

## 2016-12-09 DIAGNOSIS — F411 Generalized anxiety disorder: Secondary | ICD-10-CM

## 2016-12-09 MED ORDER — LISINOPRIL 20 MG PO TABS: 20.0000 mg | ORAL_TABLET | Freq: Every day | ORAL | 1 refills | Status: DC

## 2016-12-09 NOTE — Assessment & Plan Note (Signed)
Still suspect increased anxiety secondary to new diagnosis of HTN. Reassurance provided that her BP is improving and should improve with increased dose of lisinopril. If BP improves and she continues to experience these levels of anxiety, will increase Prozac or add in second medication.

## 2016-12-09 NOTE — Progress Notes (Signed)
Subjective:    Patient ID: Angela Simon, female    DOB: 07/14/1966, 51 y.o.   MRN: [id number]  HPI  Ms. Pinkett is a 51 year old female who presents today for follow up.  1) Essential Hypertension: Currently managed on Lisinopril 10 mg that was initiated several weeks ago for hypertension. Her BP during her initial visit was 152/98, her BP in the clinic today is 146/100. She's been checking her BP at home with readings of 130's-140's/70's-90's. She denies chest pain, dizziness, shortness of breath. She has been anxious about her BP readings and is having difficulty accepting a diagnosis of hypertension. She denies chest pain, shortness of breath, dizziness. She has noticed some insomnia since starting lisinopril.  2) GAD: Currently managed on Fluoxetine 20 mg. GAD 7 score of 17 during last visit. Her medication was not altered as it was thought her high blood pressure to be causing her increased anxiety. She is very anxious today regarding her BP numbers. She did have a panic attack Saturday last weekend due to elevated BP readings. She have been through a lot of stress over the past 6 months and overall feels as though she has improved.   Review of Systems  Eyes: Negative for visual disturbance.  Respiratory: Negative for cough and shortness of breath.   Cardiovascular: Negative for chest pain.  Neurological: Negative for dizziness and headaches.  Psychiatric/Behavioral: Positive for sleep disturbance. The patient is nervous/anxious.        Past Medical History:  Diagnosis Date  . Essential hypertension   . GERD (gastroesophageal reflux disease)   . Gestational diabetes   . IBS (irritable bowel syndrome)   . Melanoma ([place])   . Renal disorder      Social History   Social History  . Marital status: Married    Spouse name: N/A  . Number of children: N/A  . Years of education: N/A   Occupational History  . Not on file.   Social History Main Topics  . Smoking status:  Never Smoker  . Smokeless tobacco: Never Used  . Alcohol use No  . Drug use: No  . Sexual activity: Not on file   Other Topics Concern  . Not on file   Social History Narrative   Married.   2 children.   No grandchildren.   Self employed.   Enjoys shopping, decorating.        Past Surgical History:  Procedure Laterality Date  . BREAST REDUCTION SURGERY    . CESAREAN SECTION    . MELANOMA EXCISION     Right arm  . NASAL SINUS SURGERY    . TONSILLECTOMY      Family History  Problem Relation Age of Onset  . Heart attack Mother   . Hypertension Mother   . Diabetes Father   . Prostate cancer Father   . Breast cancer Sister     No Known Allergies  Current Outpatient Prescriptions on File Prior to Visit  Medication Sig Dispense Refill  . cetirizine (ZYRTEC) 10 MG tablet Take 10 mg by mouth daily.    . ferrous sulfate 325 (65 FE) MG tablet Take 325 mg by mouth daily with breakfast.    . FLUoxetine (PROZAC) 20 MG capsule Take by mouth.    . Multiple Vitamin (MULTIVITAMIN) tablet Take 1 tablet by mouth daily.    . Omega-3 1000 MG CAPS Take by mouth.    Marland Kitchen omeprazole (PRILOSEC) 10 MG capsule Take 10 capsules by mouth  daily.     No current facility-administered medications on file prior to visit.     BP (!) 146/100   Pulse 88   Temp 97.6 F (36.4 C) (Oral)   Ht 5' (1.524 m)   Wt 173 lb 1.9 oz (78.5 kg)   LMP 11/20/2016   SpO2 98%   BMI 33.81 kg/m    Objective:   Physical Exam  Constitutional: She appears well-nourished.  Cardiovascular: Normal rate and regular rhythm.   Pulmonary/Chest: Effort normal and breath sounds normal.  Skin: Skin is warm and dry.  Psychiatric:  Anxious about BP          Assessment & Plan:

## 2016-12-09 NOTE — Progress Notes (Signed)
Pre visit review using our clinic review tool, if applicable. No additional management support is needed unless otherwise documented below in the visit note. 

## 2016-12-09 NOTE — Patient Instructions (Signed)
We've increased your Lisinopril from 10 mg to 20 mg. You may take two of the 10 mg tablets until your bottle is empty, I sent the 20 mg tablets to your pharmacy.  Check your blood pressure daily, around the same time of day, for the next 2 weeks.  Ensure that you have rested for 30 minutes prior to checking your blood pressure. Record your readings and I'll call you in 2 weeks.  Complete lab work prior to leaving today. I will notify you of your results once received.   It was a pleasure to see you today!

## 2016-12-09 NOTE — Assessment & Plan Note (Signed)
Improved on lisinopril 10 mg but not at goal. Increase dose to 20 mg. Check BMP today. Will call her for readings in 2 weeks.

## 2016-12-10 LAB — BASIC METABOLIC PANEL
BUN: 14 mg/dL (ref 6–23)
CO2: 31 mEq/L (ref 19–32)
Calcium: 9.8 mg/dL (ref 8.4–10.5)
Chloride: 100 mEq/L (ref 96–112)
Creatinine, Ser: 0.81 mg/dL (ref 0.40–1.20)
GFR: 80.13 mL/min (ref >60.00)
Glucose, Bld: 80 mg/dL (ref 70–99)
Potassium: 4.3 mEq/L (ref 3.5–5.1)
Sodium: 139 mEq/L (ref 135–145)

## 2016-12-12 ENCOUNTER — Ambulatory Visit: Payer: Managed Care, Other (non HMO) | Admitting: Primary Care

## 2016-12-19 ENCOUNTER — Ambulatory Visit: Payer: Managed Care, Other (non HMO) | Admitting: Primary Care

## 2016-12-23 ENCOUNTER — Telehealth: Payer: Self-pay | Admitting: Primary Care

## 2016-12-23 DIAGNOSIS — F411 Generalized anxiety disorder: Secondary | ICD-10-CM

## 2016-12-23 NOTE — Telephone Encounter (Signed)
Message left for patient to return my call.  

## 2016-12-23 NOTE — Telephone Encounter (Signed)
-----   Message from Pleas Koch, NP sent at 12/09/2016  4:42 PM EST ----- Regarding: BP Please check on patient's BP readings.

## 2016-12-25 NOTE — Telephone Encounter (Signed)
Message left for patient to return my call.  

## 2016-12-30 NOTE — Telephone Encounter (Signed)
I agree that her BP rise is likely due to anxiety. I recommend we increase her Prozac to 40 mg. I do not recommend medications like Xanax as they are highly addictive to the body and only provide temporary treatment. Let me know if she's agreeable to the increase dose of Prozac and I'll send. Also schedule her for a 4 week anxiety and BP follow up. Thanks.

## 2016-12-30 NOTE — Telephone Encounter (Signed)
Spoken to patient. Her BP readings have been 140/80, 142/80, 162/84, 126/81, 130/84, and 148/86.  Patient stated that she get worked up then she noticed her blood pressure goes up. Patient stated she know that it must be her anxiety. She stated that she does okay most days but once in a while she has these worked up moments. Patient wanted Anda Kraft to know that one time last week, she took half a tablet of her mother's Xanax and it helped. Patient stated what should she do? Is there something Anda Kraft can prescribed?  Also patient stated that the last time she was in the office, Anda Kraft looked at her right ear. Patient stated her right ear feels worse and now it is tender with a little bit of pain.

## 2016-12-30 NOTE — Telephone Encounter (Signed)
Pt returned your call, call her on cell [phone].

## 2016-12-31 MED ORDER — FLUOXETINE HCL 40 MG PO CAPS: 40.0000 mg | ORAL_CAPSULE | Freq: Every day | ORAL | 0 refills | Status: DC

## 2016-12-31 NOTE — Telephone Encounter (Signed)
Spoken and notified patient of Kate's comments. Patient verbalized understanding.  Patient will finish the rest of 20 mg of Prozac. Please send in the 40 mg.  Follow up is on 01/30/2017

## 2016-12-31 NOTE — Telephone Encounter (Signed)
Noted, Rx sent to pharmacy. 

## 2017-01-09 DIAGNOSIS — F419 Anxiety disorder, unspecified: Secondary | ICD-10-CM

## 2017-01-09 DIAGNOSIS — F329 Major depressive disorder, single episode, unspecified: Secondary | ICD-10-CM | POA: Insufficient documentation

## 2017-01-14 ENCOUNTER — Ambulatory Visit: Payer: Managed Care, Other (non HMO) | Admitting: Family

## 2017-01-15 ENCOUNTER — Encounter: Payer: Self-pay | Admitting: Primary Care

## 2017-01-20 NOTE — Telephone Encounter (Signed)
Angela Simon, please call patient and schedule a 30 minute office visit for anxiety and sinus/ear issues. Thanks.

## 2017-01-21 NOTE — Telephone Encounter (Signed)
Scheduled appt on 01/23/2017

## 2017-01-23 ENCOUNTER — Ambulatory Visit (INDEPENDENT_AMBULATORY_CARE_PROVIDER_SITE_OTHER): Payer: Managed Care, Other (non HMO) | Admitting: Primary Care

## 2017-01-23 ENCOUNTER — Encounter: Payer: Self-pay | Admitting: Primary Care

## 2017-01-23 VITALS — BP 146/96 | HR 77 | Temp 97.8°F | Ht 60.0 in | Wt 177.8 lb

## 2017-01-23 DIAGNOSIS — I10 Essential (primary) hypertension: Secondary | ICD-10-CM | POA: Diagnosis not present

## 2017-01-23 DIAGNOSIS — F411 Generalized anxiety disorder: Secondary | ICD-10-CM

## 2017-01-23 MED ORDER — LISINOPRIL-HYDROCHLOROTHIAZIDE 20-12.5 MG PO TABS: 1.0000 | ORAL_TABLET | Freq: Every day | ORAL | 0 refills | Status: DC

## 2017-01-23 NOTE — Assessment & Plan Note (Signed)
Above goal today, also on home readings. Will add in HCTZ 12.5 mg with Lisinopril, new RX sent to pharmacy. Will have her monitor BP at home and follow up in the clinic in three weeks with logs. BMP UTD and on file.

## 2017-01-23 NOTE — Progress Notes (Signed)
Subjective:    Patient ID: Angela Simon, female    DOB: 09-16-1966, 51 y.o.   MRN: [id number]  HPI  Ms. Begin is a 51 year old female who presents today for follow up.  1) GAD: Currently managed on Fluoxetine 40 mg that was increased during her last visit given anxiety regarding hypertension. She messaged [place] through My Chart reporting she had decreased back down to her 20 mg dose as the 40 mg dose showed no improvement in anxiety and made her feel "weird" and didn't feel herself.   She gets worked up regarding her blood pressure levels which causes anxiety and sometimes panic. She denies depression. Anxiety is only situational (2-3 times weekly). Symptoms during those bouts consists of feeling nervous, slightly panicked, experiences mind racing thoughts.   2) Essential Hypertension: Currently managed on lisinopril 20 mg. Her BP in the office today is 146/96. She's not checking her BP at home. She was seen at her GYN office and her initial BP was 702 systolic with improvement to 637 systolic after 15 minutes of rest. She thinks a lot of her anxiety comes from seeing her BP above goal. She denies chest pain, shortness of breath, headaches.  3) Ear Fullness/Dizziness: Chronic to left ear, more recently to bilateral ears. She's had this odd sensation of "feeling movement" inside her head and ears; sensations of the room spinning. History of vertigo. She is currently managed on Zyrtec daily and a steriodal nasal spray. Overall her symptoms have improved.   Review of Systems  Respiratory: Negative for shortness of breath.   Cardiovascular: Negative for chest pain.  Neurological: Positive for dizziness. Negative for headaches.  Psychiatric/Behavioral: Negative for sleep disturbance. The patient is nervous/anxious.        Past Medical History:  Diagnosis Date  . Essential hypertension   . GERD (gastroesophageal reflux disease)   . Gestational diabetes   . IBS (irritable bowel  syndrome)   . Melanoma ([place])   . Renal disorder      Social History   Social History  . Marital status: Married    Spouse name: N/A  . Number of children: N/A  . Years of education: N/A   Occupational History  . Not on file.   Social History Main Topics  . Smoking status: Never Smoker  . Smokeless tobacco: Never Used  . Alcohol use No  . Drug use: No  . Sexual activity: Not on file   Other Topics Concern  . Not on file   Social History Narrative   Married.   2 children.   No grandchildren.   Self employed.   Enjoys shopping, decorating.        Past Surgical History:  Procedure Laterality Date  . BREAST REDUCTION SURGERY    . CESAREAN SECTION    . MELANOMA EXCISION     Right arm  . NASAL SINUS SURGERY    . TONSILLECTOMY      Family History  Problem Relation Age of Onset  . Heart attack Mother   . Hypertension Mother   . Diabetes Father   . Prostate cancer Father   . Breast cancer Sister     No Known Allergies  Current Outpatient Prescriptions on File Prior to Visit  Medication Sig Dispense Refill  . cetirizine (ZYRTEC) 10 MG tablet Take 10 mg by mouth daily.    . ferrous sulfate 325 (65 FE) MG tablet Take 325 mg by mouth daily with breakfast.    .  lisinopril (PRINIVIL,ZESTRIL) 20 MG tablet Take 1 tablet (20 mg total) by mouth daily. 30 tablet 1  . Multiple Vitamin (MULTIVITAMIN) tablet Take 1 tablet by mouth daily.    . Omega-3 1000 MG CAPS Take by mouth.    Marland Kitchen omeprazole (PRILOSEC) 10 MG capsule Take 10 capsules by mouth daily.     No current facility-administered medications on file prior to visit.     BP (!) 146/96   Pulse 77   Temp 97.8 F (36.6 C) (Oral)   Ht 5' (1.524 m)   Wt 177 lb 12.8 oz (80.6 kg)   LMP 10/20/2016   SpO2 97%   BMI 34.72 kg/m    Objective:   Physical Exam  Constitutional: She appears well-nourished.  Neck: Neck supple.  Cardiovascular: Normal rate and regular rhythm.   Pulmonary/Chest: Effort normal and breath  sounds normal.  Skin: Skin is warm and dry.  Psychiatric: She has a normal mood and affect.          Assessment & Plan:

## 2017-01-23 NOTE — Patient Instructions (Addendum)
Stop Lisinopril 20 mg tablets for blood pressure.  Start Lisinopril/Hydrochlorothiazide 20/12.5 mg tablets for high blood pressure. Take 1 tablet by mouth once daily.  Start to monitor your blood pressure at home.   Try meclizine 25 mg tablets for vertigo. Start by taking 1/2 tablet at onset of vertigo.  Continue Fluoxetine 20 mg for anxiety.  Follow up in 3 weeks for blood pressure check.  It was a pleasure to see you today!

## 2017-01-23 NOTE — Progress Notes (Signed)
Pre visit review using our clinic review tool, if applicable. No additional management support is needed unless otherwise documented below in the visit note. 

## 2017-01-23 NOTE — Assessment & Plan Note (Signed)
Will continue dose of Fluoxetine 20 mg. Suspect if she sees stable BP readings her anxiety will overall decrease. Seems as though most of her anxiety is situational for which she handles well on her own. Will continue to monitor.

## 2017-01-25 ENCOUNTER — Encounter: Payer: Self-pay | Admitting: Primary Care

## 2017-01-30 ENCOUNTER — Ambulatory Visit: Payer: Managed Care, Other (non HMO) | Admitting: Primary Care

## 2017-02-06 ENCOUNTER — Ambulatory Visit: Payer: Managed Care, Other (non HMO) | Admitting: Primary Care

## 2017-02-06 ENCOUNTER — Ambulatory Visit (INDEPENDENT_AMBULATORY_CARE_PROVIDER_SITE_OTHER): Payer: Managed Care, Other (non HMO) | Admitting: Primary Care

## 2017-02-06 ENCOUNTER — Encounter: Payer: Self-pay | Admitting: Primary Care

## 2017-02-06 DIAGNOSIS — R5383 Other fatigue: Secondary | ICD-10-CM

## 2017-02-06 DIAGNOSIS — R002 Palpitations: Secondary | ICD-10-CM | POA: Diagnosis not present

## 2017-02-06 DIAGNOSIS — I10 Essential (primary) hypertension: Secondary | ICD-10-CM

## 2017-02-06 LAB — CBC
HCT: 38.8 % (ref 36.0–46.0)
Hemoglobin: 13.2 g/dL (ref 12.0–15.0)
MCHC: 34.1 g/dL (ref 30.0–36.0)
MCV: 87.4 fl (ref 78.0–100.0)
Platelets: 345 10*3/uL (ref 150.0–400.0)
RBC: 4.44 Mil/uL (ref 3.87–5.11)
RDW: 14 % (ref 11.5–15.5)
WBC: 6.9 10*3/uL (ref 4.0–10.5)

## 2017-02-06 LAB — BASIC METABOLIC PANEL
BUN: 17 mg/dL (ref 6–23)
CO2: 32 mEq/L (ref 19–32)
Calcium: 9.8 mg/dL (ref 8.4–10.5)
Chloride: 99 mEq/L (ref 96–112)
Creatinine, Ser: 0.8 mg/dL (ref 0.40–1.20)
GFR: 81.23 mL/min (ref >60.00)
Glucose, Bld: 104 mg/dL — ABNORMAL HIGH (ref 70–99)
Potassium: 3.9 mEq/L (ref 3.5–5.1)
Sodium: 140 mEq/L (ref 135–145)

## 2017-02-06 LAB — T4, FREE: Free T4: 0.51 ng/dL — ABNORMAL LOW (ref 0.60–1.60)

## 2017-02-06 LAB — VITAMIN B12: Vitamin B-12: 658 pg/mL (ref 211–911)

## 2017-02-06 LAB — TSH: TSH: 1.9 u[IU]/mL (ref 0.35–4.50)

## 2017-02-06 LAB — VITAMIN D 25 HYDROXY (VIT D DEFICIENCY, FRACTURES): VITD: 33.01 ng/mL (ref 30.00–100.00)

## 2017-02-06 NOTE — Progress Notes (Signed)
Subjective:    Patient ID: Angela Simon, female    DOB: Apr 03, 1966, 51 y.o.   MRN: [id number]  HPI  Angela Simon is a 51 year old female who presents today for follow up of hypertension.  Currently managed on lisinopril/HCTZ 20/12.5 mg once daily. She's been checking her BP at home infrequently and getting readings ranging 114-146/70'-100's. Her pulse ranges between high 90's-120's over the past 1-2 weeks. Since her last visit she's experiencing symptoms of feeling shaky, hot flashes, low energy, headaches. Over the past 1 year she's had irregular menstrual periods. Her GYN does think she's perimenopausal.   She denies chest pain, shortness of breath, dizziness. Overall her headaches have improved.   Review of Systems  Constitutional: Positive for fatigue.  Respiratory: Negative for shortness of breath.   Cardiovascular: Positive for palpitations. Negative for chest pain.  Neurological: Positive for headaches. Negative for dizziness.  Psychiatric/Behavioral: The patient is nervous/anxious.        Past Medical History:  Diagnosis Date  . Essential hypertension   . GERD (gastroesophageal reflux disease)   . Gestational diabetes   . IBS (irritable bowel syndrome)   . Melanoma ([place])   . Renal disorder      Social History   Social History  . Marital status: Married    Spouse name: N/A  . Number of children: N/A  . Years of education: N/A   Occupational History  . Not on file.   Social History Main Topics  . Smoking status: Never Smoker  . Smokeless tobacco: Never Used  . Alcohol use No  . Drug use: No  . Sexual activity: Not on file   Other Topics Concern  . Not on file   Social History Narrative   Married.   2 children.   No grandchildren.   Self employed.   Enjoys shopping, decorating.        Past Surgical History:  Procedure Laterality Date  . BREAST REDUCTION SURGERY    . CESAREAN SECTION    . MELANOMA EXCISION     Right arm  . NASAL SINUS  SURGERY    . TONSILLECTOMY      Family History  Problem Relation Age of Onset  . Heart attack Mother   . Hypertension Mother   . Diabetes Father   . Prostate cancer Father   . Breast cancer Sister     No Known Allergies  Current Outpatient Prescriptions on File Prior to Visit  Medication Sig Dispense Refill  . cetirizine (ZYRTEC) 10 MG tablet Take 10 mg by mouth daily.    . ferrous sulfate 325 (65 FE) MG tablet Take 325 mg by mouth daily with breakfast.    . FLUoxetine (PROZAC) 20 MG tablet Take 20 mg by mouth daily.    Marland Kitchen lisinopril-hydrochlorothiazide (ZESTORETIC) 20-12.5 MG tablet Take 1 tablet by mouth daily. 30 tablet 0  . Multiple Vitamin (MULTIVITAMIN) tablet Take 1 tablet by mouth daily.    . Omega-3 1000 MG CAPS Take by mouth.    Marland Kitchen omeprazole (PRILOSEC) 10 MG capsule Take 10 capsules by mouth daily.     No current facility-administered medications on file prior to visit.     LMP  (LMP Unknown)    Objective:   Physical Exam  Constitutional: She appears well-nourished.  Neck: Neck supple.  Cardiovascular: Normal rate, regular rhythm and normal heart sounds.   No murmur heard. Skin: Skin is warm and dry.  Psychiatric: She has a normal mood and affect.  Assessment & Plan:

## 2017-02-06 NOTE — Patient Instructions (Addendum)
Complete lab work prior to leaving today. I will notify you of your results once received.   Your ECG looks good.  I'll be in touch regarding your next steps.  It was a pleasure to see you today!

## 2017-02-06 NOTE — Progress Notes (Signed)
Pre visit review using our clinic review tool, if applicable. No additional management support is needed unless otherwise documented below in the visit note. 

## 2017-02-06 NOTE — Assessment & Plan Note (Signed)
Overall improved with lisinopril/HCTZ 20/12.5 mg, some normal home readings. ECG today unremarkable. Check TSH, CBC, BMP for palpitations. If labs unremarkable then consider low does Toprol for palpitations and HTN.

## 2017-02-09 ENCOUNTER — Encounter: Payer: Self-pay | Admitting: Primary Care

## 2017-02-09 DIAGNOSIS — I10 Essential (primary) hypertension: Secondary | ICD-10-CM

## 2017-02-09 MED ORDER — METOPROLOL SUCCINATE ER 25 MG PO TB24: 25.0000 mg | ORAL_TABLET | Freq: Every day | ORAL | 0 refills | Status: DC

## 2017-02-12 ENCOUNTER — Ambulatory Visit: Payer: Managed Care, Other (non HMO) | Admitting: Primary Care

## 2017-02-18 ENCOUNTER — Other Ambulatory Visit: Payer: Self-pay | Admitting: Primary Care

## 2017-02-18 ENCOUNTER — Telehealth: Payer: Self-pay | Admitting: Primary Care

## 2017-02-18 DIAGNOSIS — I10 Essential (primary) hypertension: Secondary | ICD-10-CM

## 2017-02-18 MED ORDER — LISINOPRIL-HYDROCHLOROTHIAZIDE 20-12.5 MG PO TABS: 1.0000 | ORAL_TABLET | Freq: Every day | ORAL | 3 refills | Status: DC

## 2017-02-18 NOTE — Telephone Encounter (Signed)
Medication has already been send

## 2017-02-26 ENCOUNTER — Encounter: Payer: Self-pay | Admitting: Primary Care

## 2017-03-12 ENCOUNTER — Encounter: Payer: Self-pay | Admitting: Primary Care

## 2017-04-28 ENCOUNTER — Encounter: Payer: Self-pay | Admitting: Primary Care

## 2017-06-18 ENCOUNTER — Telehealth: Payer: Self-pay

## 2017-06-18 DIAGNOSIS — I10 Essential (primary) hypertension: Secondary | ICD-10-CM

## 2017-06-18 NOTE — Telephone Encounter (Signed)
Olivia Mackie at Dr Milas Hock office said Dr Pryor Ochoa thinks pts cough is due to taking lisinopril but since he did not order med he wants to get Gentry Fitz NP opinion about changing lisinopril to different med; I asked if wanted pt to schedule appt and was advised to send note to Gentry Fitz NP first to see if she thought OK to change med to something besides lisinopril. Please advise. Olivia Mackie request cb to pt; Olivia Mackie said pt took lisinopril today but is not sure if pt is going to take it on 06/19/17.

## 2017-06-19 MED ORDER — LOSARTAN POTASSIUM-HCTZ 50-12.5 MG PO TABS: 1.0000 | ORAL_TABLET | Freq: Every day | ORAL | 0 refills | Status: DC

## 2017-06-19 NOTE — Telephone Encounter (Signed)
Spoken to patient and she stated that Dr Pryor Ochoa is EN-otolaryngologist in [place]. Patient stated that she was told they would let Anda Kraft know that they recommend changing the BP medication.  Patient stated she is agreeable to whichever medication Anda Kraft recommend. Please send to [facility] in [facility]

## 2017-06-19 NOTE — Telephone Encounter (Signed)
Spoken and notified patient of Kate's comments. Patient verbalized understanding. 

## 2017-06-19 NOTE — Telephone Encounter (Signed)
Okay to change blood pressure medication, would recommend losartan-HCTZ 50-12.5 mg.  What type of specialist is Dr. Pryor Ochoa? Are they planning on changing the medication? If not that's fine, just let me know.

## 2017-06-19 NOTE — Telephone Encounter (Signed)
Rx for losartan-HCTZ 50/12.5 mg sent to pharmacy. Have her monitor BP and we will message her for readings in 2 weeks.

## 2017-07-03 ENCOUNTER — Encounter: Payer: Self-pay | Admitting: Primary Care

## 2017-09-03 ENCOUNTER — Other Ambulatory Visit: Payer: Self-pay | Admitting: Primary Care

## 2017-09-03 DIAGNOSIS — I10 Essential (primary) hypertension: Secondary | ICD-10-CM

## 2018-01-06 ENCOUNTER — Other Ambulatory Visit: Payer: Self-pay | Admitting: *Deleted

## 2018-01-06 ENCOUNTER — Telehealth: Payer: Self-pay | Admitting: Primary Care

## 2018-01-06 DIAGNOSIS — I10 Essential (primary) hypertension: Secondary | ICD-10-CM

## 2018-01-06 MED ORDER — LOSARTAN POTASSIUM-HCTZ 50-12.5 MG PO TABS: 1.0000 | ORAL_TABLET | Freq: Every day | ORAL | 1 refills | Status: DC

## 2018-01-06 NOTE — Telephone Encounter (Signed)
Rx refilled per protocol- LOV:2/19 and E-mail BP good to continue

## 2018-01-06 NOTE — Telephone Encounter (Unsigned)
Copied from [place] [id number]. Topic: Quick Communication - Rx Refill/Question >> Jan 06, 2018  2:03 PM Carolyn Stare wrote: Medication  losartan-hydrochlorothiazide (HYZAAR) 50-12.5 MG tablet   Has the patient contacted their pharmacy  pt changed pharmacy    Preferred Pharmacy  [facility] in [address]    Agent: Please be advised that RX refills may take up to 3 business days. We ask that you follow-up with your pharmacy.

## 2018-02-03 DIAGNOSIS — Z803 Family history of malignant neoplasm of breast: Secondary | ICD-10-CM | POA: Insufficient documentation

## 2018-02-04 DIAGNOSIS — R7303 Prediabetes: Secondary | ICD-10-CM | POA: Insufficient documentation

## 2018-02-04 DIAGNOSIS — E781 Pure hyperglyceridemia: Secondary | ICD-10-CM | POA: Insufficient documentation

## 2018-02-11 ENCOUNTER — Encounter: Payer: Self-pay | Admitting: Primary Care

## 2018-02-17 DIAGNOSIS — Z975 Presence of (intrauterine) contraceptive device: Secondary | ICD-10-CM | POA: Insufficient documentation

## 2018-03-25 ENCOUNTER — Encounter: Payer: Self-pay | Admitting: Gastroenterology

## 2018-03-25 ENCOUNTER — Ambulatory Visit (INDEPENDENT_AMBULATORY_CARE_PROVIDER_SITE_OTHER): Payer: Managed Care, Other (non HMO) | Admitting: Gastroenterology

## 2018-03-25 VITALS — BP 136/80 | HR 108 | Temp 98.2°F | Ht 60.0 in | Wt 187.8 lb

## 2018-03-25 DIAGNOSIS — D0361 Melanoma in situ of right upper limb, including shoulder: Secondary | ICD-10-CM | POA: Insufficient documentation

## 2018-03-25 DIAGNOSIS — O24419 Gestational diabetes mellitus in pregnancy, unspecified control: Secondary | ICD-10-CM | POA: Insufficient documentation

## 2018-03-25 DIAGNOSIS — K219 Gastro-esophageal reflux disease without esophagitis: Secondary | ICD-10-CM

## 2018-03-25 DIAGNOSIS — F3281 Premenstrual dysphoric disorder: Secondary | ICD-10-CM | POA: Insufficient documentation

## 2018-03-25 MED ORDER — OMEPRAZOLE 20 MG PO CPDR: 20.0000 mg | DELAYED_RELEASE_CAPSULE | Freq: Two times a day (BID) | ORAL | 1 refills | Status: DC

## 2018-03-25 NOTE — Patient Instructions (Signed)
Bed wedge F/U 3 months  Fatty Liver Fatty liver, also called hepatic steatosis or steatohepatitis, is a condition in which too much fat has built up in your liver cells. The liver removes harmful substances from your bloodstream. It produces fluids your body needs. It also helps your body use and store energy from the food you eat. In many cases, fatty liver does not cause symptoms or problems. It is often diagnosed when tests are being done for other reasons. However, over time, fatty liver can cause inflammation that may lead to more serious liver problems, such as scarring of the liver (cirrhosis). What are the causes? Causes of fatty liver may include:  Drinking too much alcohol.  Poor nutrition.  Obesity.  Cushing syndrome.  Diabetes.  Hyperlipidemia.  Pregnancy.  Certain drugs.  Poisons.  Some viral infections.  What increases the risk? You may be more likely to develop fatty liver if you:  Abuse alcohol.  Are pregnant.  Are overweight.  Have diabetes.  Have hepatitis.  Have a high triglyceride level.  What are the signs or symptoms? Fatty liver often does not cause any symptoms. In cases where symptoms develop, they can include:  Fatigue.  Weakness.  Weight loss.  Confusion.  Abdominal pain.  Yellowing of your skin and the white parts of your eyes (jaundice).  Nausea and vomiting.  How is this diagnosed? Fatty liver may be diagnosed by:  Physical exam and medical history.  Blood tests.  Imaging tests, such as an ultrasound, CT scan, or MRI.  Liver biopsy. A small sample of liver tissue is removed using a needle. The sample is then looked at under a microscope.  How is this treated? Fatty liver is often caused by other health conditions. Treatment for fatty liver may involve medicines and lifestyle changes to manage conditions such as:  Alcoholism.  High cholesterol.  Diabetes.  Being overweight or obese.  Follow these  instructions at home:  Eat a healthy diet as directed by your health care provider.  Exercise regularly. This can help you lose weight and control your cholesterol and diabetes. Talk to your health care provider about an exercise plan and which activities are best for you.  Do not drink alcohol.  Take medicines only as directed by your health care provider. Contact a health care provider if: You have difficulty controlling your:  Blood sugar.  Cholesterol.  Alcohol consumption.  Get help right away if:  You have abdominal pain.  You have jaundice.  You have nausea and vomiting. This information is not intended to replace advice given to you by your health care provider. Make sure you discuss any questions you have with your health care provider. Document Released: 11/21/2005 Document Revised: 03/13/2016 Document Reviewed: 02/15/2014 Elsevier Interactive Patient Education  [employer].

## 2018-03-25 NOTE — Progress Notes (Signed)
Angela Simon [address]  [place]  [place], [place] [address]  Main: [phone]  Fax: [phone]   Gastroenterology Consultation  Referring Provider:     Pleas Koch, NP Primary Care Physician:  Pleas Koch, NP Primary Gastroenterologist:  Dr. Vonda Simon Reason for Consultation:     Globus sensation, heartburn        HPI:    Chief Complaint  Patient presents with  . Establish Care    reflux, tightness in throat, episodes of this waking her up at night, feels like somthing in upper chest and hurts to back.    Angela Simon is a 52 y.o. y/o female referred for consultation & management  by Dr. Carlis Abbott, Leticia Penna, NP.  Patient reports chronic history of GI symptoms.  She has recently had breakthrough heartburn, and globus sensation as of the last 1 to 2 months.  Patient is on once daily omeprazole, that she sometimes takes in the morning, sometimes in the evening.  She has been on this for years, and if she misses a dose, her symptoms are severe, and she states she cannot go off the medication because of this.  She denies any dysphagia or odynophagia.  She states she has had 2 episodes, last one about 2 weeks ago, where she woke up with severe heartburn, and had to sit all the way up in her bed for her symptoms to abate.  She reports a constant globus sensation in her throat as well that started about 1 to 2 months ago.  No weight loss, altered bowel habits, constipation or diarrhea, or blood in stool.  She reports history of IBS. She was evaluated by [facility] healthcare GI, and [facility] gastroenterology in 2016 and 2015 and at that time reported abdominal bloating as well.  She underwent 2-week course of rifaximin due to bloating, which did not help.  Patient states she also underwent breath testing for SIBO, as far she knows it was negative.  This result is not available to [place].  She has also tried a low FODMAP diet, but  due to the extensive list, she has trouble keeping up with that list.  Last EGD was in July 2016 for epigastric abdominal pain and abdominal bloating:  Findings:  The examined esophagus was normal.  The entire examined stomach was normal.  The examined duodenum was normal.   Impression:- Normal esophagus.  - Normal stomach.  - Normal examined duodenum.   Patient also underwent EGD and colonoscopy in 2009: See provation EGD was normal. Colonoscopy was normal.  IBS was noted under the impression of the colonoscopy report.  Bentyl was recommended.  Abdominal ultrasound in June 2016, showed a heterogeneous echogenic liver question of fatty infiltration.    Past Medical History:  Diagnosis Date  . Essential hypertension   . GERD (gastroesophageal reflux disease)   . Gestational diabetes   . IBS (irritable bowel syndrome)   . Melanoma ([place])   . Renal disorder     Past Surgical History:  Procedure Laterality Date  . BREAST REDUCTION SURGERY    . CESAREAN SECTION    . MELANOMA EXCISION     Right arm  . NASAL SINUS SURGERY    . TONSILLECTOMY      Prior to Admission medications   Medication Sig Start Date End Date Taking? Authorizing Provider  cetirizine (ZYRTEC) 10 MG tablet Take 10 mg by mouth daily.   Yes [provider]  ferrous sulfate 325 (65 FE) MG tablet Take 325 mg by mouth daily with breakfast.   Yes [provider]  FLUoxetine (PROZAC) 20 MG tablet Take 20 mg by mouth daily.   Yes [provider]  losartan-hydrochlorothiazide (HYZAAR) 50-12.5 MG tablet Take 1 tablet by mouth daily. 01/06/18  Yes Pleas Koch, NP  Multiple Vitamin (MULTIVITAMIN) tablet Take 1 tablet by mouth daily.   Yes [provider]  Omega-3 1000 MG CAPS Take by mouth.   Yes [provider]  omeprazole  (PRILOSEC) 10 MG capsule Take 10 capsules by mouth daily.   Yes [provider]  bifidobacterium infantis (ALIGN) capsule Take by mouth.    [provider]  omega-3 acid ethyl esters (LOVAZA) 1 g capsule Take by mouth.    [provider]    Family History  Problem Relation Age of Onset  . Heart attack Mother   . Hypertension Mother   . Diabetes Father   . Prostate cancer Father   . Breast cancer Sister      Social History   Tobacco Use  . Smoking status: Never Smoker  . Smokeless tobacco: Never Used  Substance Use Topics  . Alcohol use: No  . Drug use: No    Allergies as of 03/25/2018  . (No Known Allergies)    Review of Systems:    All systems reviewed and negative except where noted in HPI.   Physical Exam:  BP 136/80   Pulse (!) 108   Temp 98.2 F (36.8 C) (Oral)   Ht 5' (1.524 m)   Wt 187 lb 12.8 oz (85.2 kg)   BMI 36.68 kg/m  No LMP recorded. Psych:  Alert and cooperative. Normal mood and affect. General:   Alert,  Well-developed, well-nourished, pleasant and cooperative in NAD Head:  Normocephalic and atraumatic. Eyes:  Sclera clear, no icterus.   Conjunctiva pink. Ears:  Normal auditory acuity. Nose:  No deformity, discharge, or lesions. Mouth:  No deformity or lesions,oropharynx pink & moist. Neck:  Supple; no masses or thyromegaly. Lungs:  Respirations even and unlabored.  Clear throughout to auscultation.   No wheezes, crackles, or rhonchi. No acute distress. Heart:  Regular rate and rhythm; no murmurs, clicks, rubs, or gallops. Abdomen:  Normal bowel sounds.  No bruits.  Soft, non-tender and non-distended without masses, hepatosplenomegaly or hernias noted.  No guarding or rebound tenderness.    Msk:  Symmetrical without gross deformities. Good, equal movement & strength bilaterally. Pulses:  Normal pulses noted. Extremities:  No clubbing or edema.  No cyanosis. Neurologic:  Alert and oriented x3;  grossly normal  neurologically. Skin:  Intact without significant lesions or rashes. No jaundice. Lymph Nodes:  No significant cervical adenopathy. Psych:  Alert and cooperative. Normal mood and affect.   Labs: CBC    Component Value Date/Time   WBC 6.9 02/06/2017 1208   RBC 4.44 02/06/2017 1208   HGB 13.2 02/06/2017 1208   HCT 38.8 02/06/2017 1208   PLT 345.0 02/06/2017 1208   MCV 87.4 02/06/2017 1208   MCHC 34.1 02/06/2017 1208   RDW 14.0 02/06/2017 1208   CMP     Component Value Date/Time   NA 140 02/06/2017 1208   K 3.9 02/06/2017 1208   CL 99 02/06/2017 1208   CO2 32 02/06/2017 1208   GLUCOSE 104 (H) 02/06/2017 1208   BUN 17 02/06/2017 1208   CREATININE 0.80 02/06/2017 1208   CALCIUM 9.8 02/06/2017 1208    Imaging Studies:  No results found.  Assessment and Plan:   Kamarah Bilotta is a 52 y.o. y/o female has been referred for globus sensation, and breakthrough acid reflux symptoms  Symptoms are consistent with acid reflux Patient has breakthrough symptoms on once daily PPI Patient educated extensively on acid reflux lifestyle modification, including buying a bed wedge, not eating 3 hrs before bedtime, diet modifications, and handout given for the same.   Will increase PPI to omeprazole 20 mg twice daily, patient instructed on proper usage to be 30 minutes before food.  She verbalized understanding (Risks of PPI use were discussed with patient including bone loss, C. Diff diarrhea, pneumonia, infections, CKD, electrolyte abnormalities.  If clinically possible based on symptoms, goal would be to maintain patient on the lowest dose possible, or discontinue the medication with institution of acid reflux lifestyle modifications over time. Pt. Verbalizes understanding and chooses to continue the medication.)  If symptoms are not improved, EGD can be considered at that time.  Previous EGDs have been normal despite symptoms in the past. Will check H. pylori serology since patient is on  PPI which can make stool testing false-negative  Patient's previous ultrasound in 2016 suggested fatty liver This was discussed with the patient No clinical evidence of cirrhosis at this time Patient was encouraged to lose weight, eat healthy as well, and avoid hepatotoxic drugs including alcohol.  She verbalized understanding.  Risks of fatty liver progressing cirrhosis without the above measures were discussed as well.  Screening colonoscopy was discussed, but patient would like to wait until above symptoms have improved prior to scheduling this which is reasonable.  Patient would like to do it with Suprep when she is ready.  Patient states her last colonoscopy needed a 2-day prep.  We will plan for 2-day prep for screening colonoscopy as well.  Dr Angela Simon

## 2018-04-16 ENCOUNTER — Telehealth: Payer: Self-pay | Admitting: Primary Care

## 2018-04-16 NOTE — Telephone Encounter (Signed)
Pt is requsting transfer of care from [facility] to Dr. Terese Door due to location. OK to transfer?  Copied from [place] [phone]. Topic: Appointment Scheduling - Scheduling Inquiry for Clinic >> Apr 16, 2018 11:40 AM Vernona Rieger wrote: Reason for CRM: Patient is requesting to transfer her care from [facility] Bossier to Dr Aundra Dubin since it is closer for her. Please call to sch [phone]

## 2018-04-16 NOTE — Telephone Encounter (Signed)
Ok with switch   [place]

## 2018-04-20 DIAGNOSIS — K219 Gastro-esophageal reflux disease without esophagitis: Secondary | ICD-10-CM | POA: Diagnosis not present

## 2018-04-20 DIAGNOSIS — J312 Chronic pharyngitis: Secondary | ICD-10-CM | POA: Diagnosis not present

## 2018-04-27 NOTE — Telephone Encounter (Signed)
Ok   TMS 

## 2018-04-27 NOTE — Telephone Encounter (Signed)
Okay to switch. Very nice patient.

## 2018-06-14 ENCOUNTER — Other Ambulatory Visit: Payer: Self-pay | Admitting: Primary Care

## 2018-06-14 DIAGNOSIS — I10 Essential (primary) hypertension: Secondary | ICD-10-CM

## 2018-06-22 ENCOUNTER — Other Ambulatory Visit: Payer: Self-pay | Admitting: Gastroenterology

## 2018-07-06 ENCOUNTER — Ambulatory Visit: Payer: Self-pay | Admitting: Gastroenterology

## 2018-07-14 ENCOUNTER — Telehealth: Payer: Self-pay | Admitting: *Deleted

## 2018-07-14 MED ORDER — LOSARTAN POTASSIUM 50 MG PO TABS: 50.0000 mg | ORAL_TABLET | Freq: Every day | ORAL | 0 refills | Status: DC

## 2018-07-14 MED ORDER — HYDROCHLOROTHIAZIDE 12.5 MG PO TABS: 12.5000 mg | ORAL_TABLET | Freq: Every day | ORAL | 0 refills | Status: DC

## 2018-07-14 NOTE — Telephone Encounter (Signed)
Losartan and hydrochlorothiazide has been to [facility] for patient. Patient has been notified.   Already spoken to [place] regarding this request

## 2018-07-14 NOTE — Telephone Encounter (Signed)
Copied from [place] [id number]. Topic: General - Other >> Jul 14, 2018  2:55 PM Burchel, Abbi R wrote: Please call pt re: BP medication changes  [phone]

## 2018-08-02 ENCOUNTER — Ambulatory Visit: Payer: Self-pay | Admitting: Gastroenterology

## 2018-08-09 ENCOUNTER — Ambulatory Visit: Payer: BLUE CROSS/BLUE SHIELD | Admitting: Gastroenterology

## 2018-08-09 ENCOUNTER — Encounter: Payer: Self-pay | Admitting: Gastroenterology

## 2018-08-09 ENCOUNTER — Other Ambulatory Visit: Payer: Self-pay

## 2018-08-09 VITALS — BP 128/77 | HR 102 | Ht 60.0 in | Wt 187.0 lb

## 2018-08-09 DIAGNOSIS — K219 Gastro-esophageal reflux disease without esophagitis: Secondary | ICD-10-CM

## 2018-08-09 DIAGNOSIS — R1013 Epigastric pain: Secondary | ICD-10-CM | POA: Diagnosis not present

## 2018-08-09 NOTE — Patient Instructions (Addendum)

## 2018-08-09 NOTE — Addendum Note (Signed)
Addended by: Earl Lagos on: 08/09/2018 01:29 PM   Modules accepted: Orders

## 2018-08-09 NOTE — Progress Notes (Addendum)
Angela Antigua, MD [address]  [place]  [place], [place] [address]  Main: [phone]  Fax: [phone]   Primary Care Physician: Pleas Koch, NP  Primary Gastroenterologist:  Dr. Vonda Simon  Chief Complaint  Patient presents with  . Follow-up    globus sensation and break through reflux    HPI: Angela Simon is a 52 y.o. female here for follow-up.  Patient reports her globus sensation and breakthrough heartburn is much improved with twice daily PPI.  She reports going to need ENT recently due to her globus sensation in the describe cobblestoning mucosa.  We do not have these records and will try to obtain them.  Denies any dysphagia.  Reports intermittent bloating especially after eating a full meal.  No nausea or vomiting.  H. pylori serology was ordered on last visit but patient did not get this done.  No melena or hematochezia.  Does report constipation and having to strain with her bowel movements at times.  Denies any family history of colon cancer.  Reports a history of a colonoscopy 3 to 4 years ago, [facility] and we do not have the records for this. (Previous colonoscopy report from February 2016, extent of exam terminal ileum, normal mucosa in the colon and terminal ileum reported.  Pathology report shows no significant pathology change.  Negative for active or microscopic colitis.  Recommendations were to resume screening colonoscopy at 52 years of age.  Linzess was renewed at the time.)  Previous history: She reports history of IBS. She was evaluated by [facility], and [facility] gastroenterology in 2016 and 2015 and at that time reported abdominal bloating as well.  She underwent 2-week course of rifaximin due to bloating, which did not help.  Patient states she also underwent breath testing for SIBO, as far she knows it was negative.  This result is not available to [place].  She has also tried a low  FODMAP diet, but due to the extensive list, she has trouble keeping up with that list.  Last EGD was in July 2016 for epigastric abdominal pain and abdominal bloating:  Findings:  The examined esophagus was normal.  The entire examined stomach was normal.  The examined duodenum was normal.   Impression:- Normal esophagus.  - Normal stomach.  - Normal examined duodenum.   Patient also underwent EGD and colonoscopy in 2009: See provation EGD was normal. Colonoscopy was normal.  IBS was noted under the impression of the colonoscopy report.  Bentyl was recommended.  Abdominal ultrasound in June 2016, showed a heterogeneous echogenic liver question of fatty infiltration.    Current Outpatient Medications  Medication Sig Dispense Refill  . bifidobacterium infantis (ALIGN) capsule Take by mouth.    . cetirizine (ZYRTEC) 10 MG tablet Take 10 mg by mouth daily.    . ferrous sulfate 325 (65 FE) MG tablet Take 325 mg by mouth daily with breakfast.    . FLUoxetine (PROZAC) 20 MG tablet Take 20 mg by mouth daily.    . hydrochlorothiazide (HYDRODIURIL) 12.5 MG tablet Take 1 tablet (12.5 mg total) by mouth daily. 90 tablet 0  . losartan (COZAAR) 50 MG tablet Take 1 tablet (50 mg total) by mouth daily. 90 tablet 0  . Multiple Vitamin (MULTIVITAMIN) tablet Take 1 tablet by mouth daily.    . Omega-3 1000 MG CAPS Take by mouth.    Marland Kitchen omeprazole (PRILOSEC) 20 MG capsule TAKE 1 CAPSULE BY  MOUTH TWICE DAILY BEFORE MEAL(S) 60 capsule 1   No current facility-administered medications for this visit.     Allergies as of 08/09/2018  . (No Known Allergies)    ROS:  General: Negative for anorexia, weight loss, fever, chills, fatigue, weakness. ENT: Negative for hoarseness, difficulty swallowing , nasal congestion. CV: Negative for chest pain, angina,  palpitations, dyspnea on exertion, peripheral edema.  Respiratory: Negative for dyspnea at rest, dyspnea on exertion, cough, sputum, wheezing.  GI: See history of present illness. GU:  Negative for dysuria, hematuria, urinary incontinence, urinary frequency, nocturnal urination.  Endo: Negative for unusual weight change.    Physical Examination:   BP 128/77   Pulse (!) 102   Ht 5' (1.524 m)   Wt 187 lb (84.8 kg)   BMI 36.52 kg/m   General: Well-nourished, well-developed in no acute distress.  Eyes: No icterus. Conjunctivae pink. Mouth: Oropharyngeal mucosa moist and pink , no lesions erythema or exudate. Neck: Supple, Trachea midline Abdomen: Bowel sounds are normal, nontender, nondistended, no hepatosplenomegaly or masses, no abdominal bruits or hernia , no rebound or guarding.   Extremities: No lower extremity edema. No clubbing or deformities. Neuro: Alert and oriented x 3.  Grossly intact. Skin: Warm and dry, no jaundice.   Psych: Alert and cooperative, normal mood and affect.   Labs: CMP     Component Value Date/Time   NA 140 02/06/2017 1208   K 3.9 02/06/2017 1208   CL 99 02/06/2017 1208   CO2 32 02/06/2017 1208   GLUCOSE 104 (H) 02/06/2017 1208   BUN 17 02/06/2017 1208   CREATININE 0.80 02/06/2017 1208   CALCIUM 9.8 02/06/2017 1208   Lab Results  Component Value Date   WBC 6.9 02/06/2017   HGB 13.2 02/06/2017   HCT 38.8 02/06/2017   MCV 87.4 02/06/2017   PLT 345.0 02/06/2017    Imaging Studies: No results found.  Assessment and Plan:   Angela Simon is a 52 y.o. y/o female here for follow-up, with resolution of globus sensation and heartburn with twice daily PPI, patient reporting dyspepsia  We discussed EGD, versus conservative management Patient would like to proceed with conservative management at this time, and is agreeable to getting H. pylori serology done that was ordered on last visit  We extensively discussed decreasing PPI and risk  associated with continued PPI use, but given her significant improvement in symptoms patient would like to continue PPI. (Risks of PPI use were discussed with patient including bone loss, C. Diff diarrhea, pneumonia, infections, CKD, electrolyte abnormalities.  If clinically possible based on symptoms, goal would be to maintain patient on the lowest dose possible, or discontinue the medication with institution of acid reflux lifestyle modifications over time. Pt. Verbalizes understanding and chooses to continue the medication.)  Patient reports history of a colonoscopy 3 or 4 years ago, and has signed a record release, and we will try to obtain the records.  (The only previous records sent to [place] was a hydrogen breath test from 2016 at [facility], which was negative for bacterial overgrowth.  [facility] notes state that patient was seen in [facility] and diagnosed with IBS.  She was given a course of Xifaxan and recommended low FODMAP diet, which she did not notice any significant difference with so stopped it.  She was given doxepin, dicyclomine, fluoxetine in the past.  She had a liver ultrasound that showed fatty infiltration in 2016.  She also had AN EGD in July 2016,  and they have not sent [place] the procedure report itself following the anesthesia encounter associated with that visit.)  Previous ultrasound in 2016 suggested fatty liver and this was discussed with the patient No clinical evidence of cirrhosis at this time Finding of fatty liver on imaging discussed with patient Diet, weight loss, and exercise encouraged along with avoiding hepatotoxic drugs including alcohol Risk of progression to cirrhosis if above measures are not instituted were discussed as well, and patient verbalized understanding    Dr Angela Simon

## 2018-08-11 LAB — H PYLORI, IGM, IGG, IGA AB
H pylori, IgM Abs: <9 units (ref 0.0–8.9)
H. pylori, IgA Abs: <9 units (ref 0.0–8.9)
H. pylori, IgG AbS: 0.33 Index Value (ref 0.00–0.79)

## 2018-08-12 ENCOUNTER — Ambulatory Visit: Payer: Self-pay | Admitting: Gastroenterology

## 2018-08-18 ENCOUNTER — Encounter: Payer: Self-pay | Admitting: Internal Medicine

## 2018-08-25 ENCOUNTER — Encounter: Payer: Self-pay | Admitting: Radiology

## 2018-08-27 ENCOUNTER — Encounter: Payer: Self-pay | Admitting: Internal Medicine

## 2018-08-27 ENCOUNTER — Ambulatory Visit: Payer: BLUE CROSS/BLUE SHIELD | Admitting: Internal Medicine

## 2018-08-27 VITALS — BP 124/84 | HR 78 | Temp 98.4°F | Resp 15 | Ht 60.0 in | Wt 187.0 lb

## 2018-08-27 DIAGNOSIS — R7303 Prediabetes: Secondary | ICD-10-CM

## 2018-08-27 DIAGNOSIS — E669 Obesity, unspecified: Secondary | ICD-10-CM | POA: Diagnosis not present

## 2018-08-27 DIAGNOSIS — Z1283 Encounter for screening for malignant neoplasm of skin: Secondary | ICD-10-CM

## 2018-08-27 DIAGNOSIS — Z85820 Personal history of malignant melanoma of skin: Secondary | ICD-10-CM

## 2018-08-27 DIAGNOSIS — K589 Irritable bowel syndrome without diarrhea: Secondary | ICD-10-CM

## 2018-08-27 DIAGNOSIS — E785 Hyperlipidemia, unspecified: Secondary | ICD-10-CM | POA: Insufficient documentation

## 2018-08-27 DIAGNOSIS — Z1389 Encounter for screening for other disorder: Secondary | ICD-10-CM

## 2018-08-27 DIAGNOSIS — Z13818 Encounter for screening for other digestive system disorders: Secondary | ICD-10-CM

## 2018-08-27 DIAGNOSIS — E559 Vitamin D deficiency, unspecified: Secondary | ICD-10-CM

## 2018-08-27 DIAGNOSIS — Z1159 Encounter for screening for other viral diseases: Secondary | ICD-10-CM

## 2018-08-27 DIAGNOSIS — K760 Fatty (change of) liver, not elsewhere classified: Secondary | ICD-10-CM | POA: Diagnosis not present

## 2018-08-27 DIAGNOSIS — I10 Essential (primary) hypertension: Secondary | ICD-10-CM

## 2018-08-27 MED ORDER — PHENTERMINE HCL 37.5 MG PO TABS: 37.5000 mg | ORAL_TABLET | Freq: Every day | ORAL | 0 refills | Status: DC

## 2018-08-27 NOTE — Progress Notes (Signed)
Chief Complaint  Patient presents with  . Establish Care   toc  1. Obesity bmi 36.52 doing wt watchers to lose and has a gym  2. Fatty liver disc'ed today and h/o IBS seeing [facility]  3. HLD will repeat labs upcoming   Review of Systems  Constitutional: Negative for weight loss.  HENT: Negative for hearing loss.   Eyes: Negative for blurred vision.  Respiratory: Negative for shortness of breath.   Cardiovascular: Negative for chest pain.  Gastrointestinal: Negative for abdominal pain.  Musculoskeletal: Negative for falls.  Skin: Negative for rash.  Neurological: Negative for headaches.  Psychiatric/Behavioral: Negative for depression.   Past Medical History:  Diagnosis Date  . Essential hypertension   . GERD (gastroesophageal reflux disease)   . Gestational diabetes   . IBS (irritable bowel syndrome)   . Melanoma ([place])   . Renal disorder    Past Surgical History:  Procedure Laterality Date  . BREAST REDUCTION SURGERY    . CESAREAN SECTION    . MELANOMA EXCISION     Right arm  . NASAL SINUS SURGERY    . TONSILLECTOMY     Family History  Problem Relation Age of Onset  . Heart attack Mother   . Hypertension Mother   . Diabetes Father   . Prostate cancer Father   . Breast cancer Sister    Social History   Socioeconomic History  . Marital status: Married    Spouse name: Not on file  . Number of children: Not on file  . Years of education: Not on file  . Highest education level: Not on file  Occupational History  . Not on file  Social Needs  . Financial resource strain: Not on file  . Food insecurity:    Worry: Not on file    Inability: Not on file  . Transportation needs:    Medical: Not on file    Non-medical: Not on file  Tobacco Use  . Smoking status: Never Smoker  . Smokeless tobacco: Never Used  Substance and Sexual Activity  . Alcohol use: No  . Drug use: No  . Sexual activity: Not on file  Lifestyle  . Physical activity:    Days per week:  Not on file    Minutes per session: Not on file  . Stress: Not on file  Relationships  . Social connections:    Talks on phone: Not on file    Gets together: Not on file    Attends religious service: Not on file    Active member of club or organization: Not on file    Attends meetings of clubs or organizations: Not on file    Relationship status: Not on file  . Intimate partner violence:    Fear of current or ex partner: Not on file    Emotionally abused: Not on file    Physically abused: Not on file    Forced sexual activity: Not on file  Other Topics Concern  . Not on file  Social History Narrative   Married.   2 children.   No grandchildren.   Self employed.   Enjoys shopping, decorating.    Current Meds  Medication Sig  . bifidobacterium infantis (ALIGN) capsule Take by mouth.  . cetirizine (ZYRTEC) 10 MG tablet Take 10 mg by mouth daily.  . ferrous sulfate 325 (65 FE) MG tablet Take 325 mg by mouth daily with breakfast.  . FLUoxetine (PROZAC) 20 MG tablet Take 20 mg by mouth  daily.  . hydrochlorothiazide (HYDRODIURIL) 12.5 MG tablet Take 1 tablet (12.5 mg total) by mouth daily.  Marland Kitchen losartan (COZAAR) 50 MG tablet Take 1 tablet (50 mg total) by mouth daily.  . Multiple Vitamin (MULTIVITAMIN) tablet Take 1 tablet by mouth daily.  . Omega-3 1000 MG CAPS Take by mouth.  Marland Kitchen omeprazole (PRILOSEC) 20 MG capsule TAKE 1 CAPSULE BY MOUTH TWICE DAILY BEFORE MEAL(S)   No Known Allergies Recent Results (from the past 2160 hour(s))  H Pylori, IGM, IGG, IGA AB     Status: None   Collection Time: 08/09/18  1:51 PM  Result Value Ref Range   H. pylori, IgG AbS 0.33 0.00 - 0.79 Index Value    Comment:                              Negative           <0.80                              Equivocal    0.80 - 0.89                              Positive           >0.89    H. pylori, IgA Abs <9.0 0.0 - 8.9 units    Comment:                                 Negative          <9.0                                  Equivocal   9.0 - 11.0                                 Positive         >11.0    H pylori, IgM Abs <9.0 0.0 - 8.9 units    Comment:                                 Negative          <9.0                                 Equivocal   9.0 - 11.0                                 Positive         >11.0 This test was developed and its performance characteristics determined by [facility]. It has not been cleared or approved by the Food and Drug Administration.    Objective  Body mass index is 36.52 kg/m. Wt Readings from Last 3 Encounters:  08/27/18 187 lb (84.8 kg)  08/09/18 187 lb (84.8 kg)  03/25/18 187 lb 12.8 oz (85.2 kg)   Temp Readings from Last 3 Encounters:  08/27/18 98.4 F (36.9 C) (Oral)  03/25/18 98.2  F (36.8 C) (Oral)  01/23/17 97.8 F (36.6 C) (Oral)   BP Readings from Last 3 Encounters:  08/27/18 124/84  08/09/18 128/77  03/25/18 136/80   Pulse Readings from Last 3 Encounters:  08/27/18 78  08/09/18 (!) 102  03/25/18 (!) 108    Physical Exam  Constitutional: She is oriented to person, place, and time. Vital signs are normal. She appears well-developed and well-nourished. She is cooperative.  HENT:  Head: Normocephalic and atraumatic.  Mouth/Throat: Oropharynx is clear and moist and mucous membranes are normal.  Eyes: Pupils are equal, round, and reactive to light. Conjunctivae are normal.  Cardiovascular: Normal rate, regular rhythm and normal heart sounds.  Pulmonary/Chest: Effort normal and breath sounds normal.  Neurological: She is alert and oriented to person, place, and time. Gait normal.  Skin: Skin is warm, dry and intact.  Psychiatric: She has a normal mood and affect. Her speech is normal and behavior is normal. Judgment and thought content normal. Cognition and memory are normal.  Nursing note and vitals reviewed.   Assessment   1. Obesity BMI 36.52 could be related inactivity or menopause  2. Fatty liver and HLd and prediabetes   3. HM Plan   1. adipex 37.5 x 2 months f/u in 2 months Of note does not want to try wellbutrin for menopause  2. Check fasting labs upcoming  Given fatty liver and hld info today 3  Had flu shot  Disc Tdap today  Consider shingrix in future  Pap 02/08/18 neg neg hpv  mammo neg 02/12/18 Get record colonoscopy  Refer to Dr. Kellie Moor skin   Provider: Dr. Olivia Mackie Mc[facility]

## 2018-08-27 NOTE — Patient Instructions (Addendum)
Dr. Karle Barr   Phentermine tablets or capsules What is this medicine? PHENTERMINE (FEN ter meen) decreases your appetite. It is used with a reduced calorie diet and exercise to help you lose weight. This medicine may be used for other purposes; ask your health care provider or pharmacist if you have questions. COMMON BRAND NAME(S): Adipex-P, Atti-Plex P, Atti-Plex P Spansule, Fastin, Lomaira, Pro-Fast, Tara-8 What should I tell my health care provider before I take this medicine? They need to know if you have any of these conditions: -agitation -glaucoma -heart disease -high blood pressure -history of substance abuse -lung disease called Primary Pulmonary Hypertension (PPH) -taken an MAOI like Carbex, Eldepryl, Marplan, Nardil, or Parnate in last 14 days -thyroid disease -an unusual or allergic reaction to phentermine, other medicines, foods, dyes, or preservatives -pregnant or trying to get pregnant -breast-feeding How should I use this medicine? Take this medicine by mouth with a glass of water. Follow the directions on the prescription label. The instructions for use may differ based on the product and dose you are taking. Avoid taking this medicine in the evening. It may interfere with sleep. Take your doses at regular intervals. Do not take your medicine more often than directed. Talk to your pediatrician regarding the use of this medicine in children. While this drug may be prescribed for children 17 years or older for selected conditions, precautions do apply. Overdosage: If you think you have taken too much of this medicine contact a poison control center or emergency room at once. NOTE: This medicine is only for you. Do not share this medicine with others. What if I miss a dose? If you miss a dose, take it as soon as you can. If it is almost time for your next dose, take only that dose. Do not take double or extra doses. What may interact with this medicine? Do not take  this medicine with any of the following medications: -duloxetine -MAOIs like Carbex, Eldepryl, Marplan, Nardil, and Parnate -medicines for colds or breathing difficulties like pseudoephedrine or phenylephrine -procarbazine -sibutramine -SSRIs like citalopram, escitalopram, fluoxetine, fluvoxamine, paroxetine, and sertraline -stimulants like dexmethylphenidate, methylphenidate or modafinil -venlafaxine This medicine may also interact with the following medications: -medicines for diabetes This list may not describe all possible interactions. Give your health care provider a list of all the medicines, herbs, non-prescription drugs, or dietary supplements you use. Also tell them if you smoke, drink alcohol, or use illegal drugs. Some items may interact with your medicine. What should I watch for while using this medicine? Notify your physician immediately if you become short of breath while doing your normal activities. Do not take this medicine within 6 hours of bedtime. It can keep you from getting to sleep. Avoid drinks that contain caffeine and try to stick to a regular bedtime every night. This medicine was intended to be used in addition to a healthy diet and exercise. The best results are achieved this way. This medicine is only indicated for short-term use. Eventually your weight loss may level out. At that point, the drug will only help you maintain your new weight. Do not increase or in any way change your dose without consulting your doctor. You may get drowsy or dizzy. Do not drive, use machinery, or do anything that needs mental alertness until you know how this medicine affects you. Do not stand or sit up quickly, especially if you are an older patient. This reduces the risk of dizzy or fainting spells. Alcohol may increase  dizziness and drowsiness. Avoid alcoholic drinks. What side effects may I notice from receiving this medicine? Side effects that you should report to your doctor or  health care professional as soon as possible: -chest pain, palpitations -depression or severe changes in mood -increased blood pressure -irritability -nervousness or restlessness -severe dizziness -shortness of breath -problems urinating -unusual swelling of the legs -vomiting Side effects that usually do not require medical attention (report to your doctor or health care professional if they continue or are bothersome): -blurred vision or other eye problems -changes in sexual ability or desire -constipation or diarrhea -difficulty sleeping -dry mouth or unpleasant taste -headache -nausea This list may not describe all possible side effects. Call your doctor for medical advice about side effects. You may report side effects to FDA at 1-800-FDA-1088. Where should I keep my medicine? Keep out of the reach of children. This medicine can be abused. Keep your medicine in a safe place to protect it from theft. Do not share this medicine with anyone. Selling or giving away this medicine is dangerous and against the law. This medicine may cause accidental overdose and death if taken by other adults, children, or pets. Mix any unused medicine with a substance like cat litter or coffee grounds. Then throw the medicine away in a sealed container like a sealed bag or a coffee can with a lid. Do not use the medicine after the expiration date. Store at room temperature between 20 and 25 degrees C (68 and 77 degrees F). Keep container tightly closed. NOTE: This sheet is a summary. It may not cover all possible information. If you have questions about this medicine, talk to your doctor, pharmacist, or health care provider.  2018 Elsevier/Gold Standard (2015-07-13 50:09:38)  Nonalcoholic Fatty Liver Disease Diet Nonalcoholic fatty liver disease is a condition that causes fat to accumulate in and around the liver. The disease makes it harder for the liver to work the way that it should. Following a  healthy diet can help to keep nonalcoholic fatty liver disease under control. It can also help to prevent or improve conditions that are associated with the disease, such as heart disease, diabetes, high blood pressure, and abnormal cholesterol levels. Along with regular exercise, this diet:  Promotes weight loss.  Helps to control blood sugar levels.  Helps to improve the way that the body uses insulin.  What do I need to know about this diet?  Use the glycemic index (GI) to plan your meals. The index tells you how quickly a food will raise your blood sugar. Choose low-GI foods. These foods take a longer time to raise blood sugar.  Keep track of how many calories you take in. Eating the right amount of calories will help you to achieve a healthy weight.  You may want to follow a Mediterranean diet. This diet includes a lot of vegetables, lean meats or fish, whole grains, fruits, and healthy oils and fats. What foods can I eat? Grains Whole grains, such as whole-wheat or whole-grain breads, crackers, tortillas, cereals, and pasta. Stone-ground whole wheat. Pumpernickel bread. Unsweetened oatmeal. Bulgur. Barley. Quinoa. Brown or wild rice. Corn or whole-wheat flour tortillas. Vegetables Lettuce. Spinach. Peas. Beets. Cauliflower. Cabbage. Broccoli. Carrots. Tomatoes. Squash. Eggplant. Herbs. Peppers. Onions. Cucumbers. Brussels sprouts. Yams and sweet potatoes. Beans. Lentils. Fruits Bananas. Apples. Oranges. Grapes. Papaya. Mango. Pomegranate. Kiwi. Grapefruit. Cherries. Meats and Other Protein Sources Seafood and shellfish. Lean meats. Poultry. Tofu. Dairy Low-fat or fat-free dairy products, such as yogurt, cottage cheese,  and cheese. Beverages Water. Sugar-free drinks. Tea. Coffee. Low-fat or skim milk. Milk alternatives, such as soy or almond milk. Real fruit juice. Condiments Mustard. Relish. Low-fat, low-sugar ketchup and barbecue sauce. Low-fat or fat-free mayonnaise. Sweets and  Desserts Sugar-free sweets. Fats and Oils Avocado. Canola or olive oil. Nuts and nut butters. Seeds. The items listed above may not be a complete list of recommended foods or beverages. Contact your dietitian for more options. What foods are not recommended? Palm oil and coconut oil. Processed foods. Fried foods. Sweetened drinks, such as sweet tea, milkshakes, snow cones, iced sweet drinks, and sodas. Alcohol. Sweets. Foods that contain a lot of salt or sodium. The items listed above may not be a complete list of foods and beverages to avoid. Contact your dietitian for more information. This information is not intended to replace advice given to you by your health care provider. Make sure you discuss any questions you have with your health care provider. Document Released: 02/20/2015 Document Revised: 03/13/2016 Document Reviewed: 10/31/2014 Elsevier Interactive Patient Education  2018 [place].  Fatty Liver Fatty liver, also called hepatic steatosis or steatohepatitis, is a condition in which too much fat has built up in your liver cells. The liver removes harmful substances from your bloodstream. It produces fluids your body needs. It also helps your body use and store energy from the food you eat. In many cases, fatty liver does not cause symptoms or problems. It is often diagnosed when tests are being done for other reasons. However, over time, fatty liver can cause inflammation that may lead to more serious liver problems, such as scarring of the liver (cirrhosis). What are the causes? Causes of fatty liver may include:  Drinking too much alcohol.  Poor nutrition.  Obesity.  Cushing syndrome.  Diabetes.  Hyperlipidemia.  Pregnancy.  Certain drugs.  Poisons.  Some viral infections.  What increases the risk? You may be more likely to develop fatty liver if you:  Abuse alcohol.  Are pregnant.  Are overweight.  Have diabetes.  Have hepatitis.  Have a high  triglyceride level.  What are the signs or symptoms? Fatty liver often does not cause any symptoms. In cases where symptoms develop, they can include:  Fatigue.  Weakness.  Weight loss.  Confusion.  Abdominal pain.  Yellowing of your skin and the white parts of your eyes (jaundice).  Nausea and vomiting.  How is this diagnosed? Fatty liver may be diagnosed by:  Physical exam and medical history.  Blood tests.  Imaging tests, such as an ultrasound, CT scan, or MRI.  Liver biopsy. A small sample of liver tissue is removed using a needle. The sample is then looked at under a microscope.  How is this treated? Fatty liver is often caused by other health conditions. Treatment for fatty liver may involve medicines and lifestyle changes to manage conditions such as:  Alcoholism.  High cholesterol.  Diabetes.  Being overweight or obese.  Follow these instructions at home:  Eat a healthy diet as directed by your health care provider.  Exercise regularly. This can help you lose weight and control your cholesterol and diabetes. Talk to your health care provider about an exercise plan and which activities are best for you.  Do not drink alcohol.  Take medicines only as directed by your health care provider. Contact a health care provider if: You have difficulty controlling your:  Blood sugar.  Cholesterol.  Alcohol consumption.  Get help right away if:  You have  abdominal pain.  You have jaundice.  You have nausea and vomiting. This information is not intended to replace advice given to you by your health care provider. Make sure you discuss any questions you have with your health care provider. Document Released: 11/21/2005 Document Revised: 03/13/2016 Document Reviewed: 02/15/2014 Elsevier Interactive Patient Education  2018 [place] Vaccine (Diphtheria, Tetanus, and Pertussis): What You Need to Know 1. Why get vaccinated? Diphtheria,  tetanus, and pertussis are serious diseases caused by bacteria. Diphtheria and pertussis are spread from person to person. Tetanus enters the body through cuts or wounds. DIPHTHERIA causes a thick covering in the back of the throat.  It can lead to breathing problems, paralysis, heart failure, and even death.  TETANUS (Lockjaw) causes painful tightening of the muscles, usually all over the body.  It can lead to "locking" of the jaw so the victim cannot open his mouth or swallow. Tetanus leads to death in up to 2 out of 10 cases.  PERTUSSIS (Whooping Cough) causes coughing spells so bad that it is hard for infants to eat, drink, or breathe. These spells can last for weeks.  It can lead to pneumonia, seizures (jerking and staring spells), brain damage, and death.  Diphtheria, tetanus, and pertussis vaccine (DTaP) can help prevent these diseases. Most children who are vaccinated with DTaP will be protected throughout childhood. Many more children would get these diseases if we stopped vaccinating. DTaP is a safer version of an older vaccine called DTP. DTP is no longer used in the [place]. 2. Who should get DTaP vaccine and when? Children should get 5 doses of DTaP vaccine, one dose at each of the following ages:  2 months  4 months  6 months  15-18 months  4-6 years  DTaP may be given at the same time as other vaccines. 3. Some children should not get DTaP vaccine or should wait  Children with minor illnesses, such as a cold, may be vaccinated. But children who are moderately or severely ill should usually wait until they recover before getting DTaP vaccine.  Any child who had a life-threatening allergic reaction after a dose of DTaP should not get another dose.  Any child who suffered a brain or nervous system disease within 7 days after a dose of DTaP should not get another dose.  Talk with your doctor if your child: ? had a seizure or collapsed after a dose of  DTaP, ? cried non-stop for 3 hours or more after a dose of DTaP, ? had a fever over 105F after a dose of DTaP. Ask your doctor for more information. Some of these children should not get another dose of pertussis vaccine, but may get a vaccine without pertussis, called DT. 4. Older children and adults DTaP is not licensed for adolescents, adults, or children 59 years of age and older. But older people still need protection. A vaccine called Tdap is similar to DTaP. A single dose of Tdap is recommended for people 11 through 50 years of age. Another vaccine, called Td, protects against tetanus and diphtheria, but not pertussis. It is recommended every 10 years. There are separate Vaccine Information Statements for these vaccines. 5. What are the risks from DTaP vaccine? Getting diphtheria, tetanus, or pertussis disease is much riskier than getting DTaP vaccine. However, a vaccine, like any medicine, is capable of causing serious problems, such as severe allergic reactions. The risk of DTaP vaccine causing serious harm, or death, is extremely small.  Mild problems (common)  Fever (up to about 1 child in 4)  Redness or swelling where the shot was given (up to about 1 child in 4)  Soreness or tenderness where the shot was given (up to about 1 child in 4) These problems occur more often after the 4th and 5th doses of the DTaP series than after earlier doses. Sometimes the 4th or 5th dose of DTaP vaccine is followed by swelling of the entire arm or leg in which the shot was given, lasting 1-7 days (up to about 1 child in 75). Other mild problems include:  Fussiness (up to about 1 child in 3)  Tiredness or poor appetite (up to about 1 child in 10)  Vomiting (up to about 1 child in 74) These problems generally occur 1-3 days after the shot. Moderate problems (uncommon)  Seizure (jerking or staring) (about 1 child out of 14,000)  Non-stop crying, for 3 hours or more (up to about 1 child out of  1,000)  High fever, over 105F (about 1 child out of 16,000) Severe problems (very rare)  Serious allergic reaction (less than 1 out of a million doses)  Several other severe problems have been reported after DTaP vaccine. These include: ? Long-term seizures, coma, or lowered consciousness ? Permanent brain damage. These are so rare it is hard to tell if they are caused by the vaccine. Controlling fever is especially important for children who have had seizures, for any reason. It is also important if another family member has had seizures. You can reduce fever and pain by giving your child an aspirin-free pain reliever when the shot is given, and for the next 24 hours, following the package instructions. 6. What if there is a serious reaction? What should I look for? Look for anything that concerns you, such as signs of a severe allergic reaction, very high fever, or behavior changes. Signs of a severe allergic reaction can include hives, swelling of the face and throat, difficulty breathing, a fast heartbeat, dizziness, and weakness. These would start a few minutes to a few hours after the vaccination. What should I do?  If you think it is a severe allergic reaction or other emergency that can't wait, call 9-1-1 or get the person to the nearest hospital. Otherwise, call your doctor.  Afterward, the reaction should be reported to the Vaccine Adverse Event Reporting System (VAERS). Your doctor might file this report, or you can do it yourself through the VAERS web site at www.vaers.SamedayNews.es, or by calling 934-777-0218. ? VAERS is only for reporting reactions. They do not give medical advice. 7. The National Vaccine Injury Compensation Program The [employer] Vaccine Injury Compensation Program (VICP) is a federal program that was created to compensate people who may have been injured by certain vaccines. Persons who believe they may have been injured by a vaccine can learn about the program and  about filing a claim by calling 747-007-5945 or visiting the [place] website at GoldCloset.com.ee. 8. How can I learn more?  Ask your doctor.  Call your local or state health department.  Contact the Centers for Disease Control and Prevention (CDC): ? Call 732-214-7350 (1-800-CDC-INFO) or ? Visit CDC's website at http://hunter.com/ CDC DTaP Vaccine (Diphtheria, Tetanus, and Pertussis) VIS (03/05/06) This information is not intended to replace advice given to you by your health care provider. Make sure you discuss any questions you have with your health care provider. Document Released: 08/03/2006 Document Revised: 06/26/2016 Document Reviewed: 06/26/2016 Elsevier Interactive Patient  Education  2017 [place] for Irritable Bowel Syndrome When you have irritable bowel syndrome (IBS), the foods you eat and your eating habits are very important. IBS may cause various symptoms, such as abdominal pain, constipation, or diarrhea. Choosing the right foods can help ease discomfort caused by these symptoms. Work with your health care provider and dietitian to find the best eating plan to help control your symptoms. What general guidelines do I need to follow?  Keep a food diary. This will help you identify foods that cause symptoms. Write down: ? What you eat and when. ? What symptoms you have. ? When symptoms occur in relation to your meals.  Avoid foods that cause symptoms. Talk with your dietitian about other ways to get the same nutrients that are in these foods.  Eat more foods that contain fiber. Take a fiber supplement if directed by your dietitian.  Eat your meals slowly, in a relaxed setting.  Aim to eat 5-6 small meals per day. Do not skip meals.  Drink enough fluids to keep your urine clear or pale yellow.  Ask your health care provider if you should take an over-the-counter probiotic during flare-ups to help restore healthy gut bacteria.  If you have  cramping or diarrhea, try making your meals low in fat and high in carbohydrates. Examples of carbohydrates are pasta, rice, whole grain breads and cereals, fruits, and vegetables.  If dairy products cause your symptoms to flare up, try eating less of them. You might be able to handle yogurt better than other dairy products because it contains bacteria that help with digestion. What foods are not recommended? The following are some foods and drinks that may worsen your symptoms:  Fatty foods, such as [place] fries.  Milk products, such as cheese or ice cream.  Chocolate.  Alcohol.  Products with caffeine, such as coffee.  Carbonated drinks, such as soda.  The items listed above may not be a complete list of foods and beverages to avoid. Contact your dietitian for more information. What foods are good sources of fiber? Your health care provider or dietitian may recommend that you eat more foods that contain fiber. Fiber can help reduce constipation and other IBS symptoms. Add foods with fiber to your diet a little at a time so that your body can get used to them. Too much fiber at once might cause gas and swelling of your abdomen. The following are some foods that are good sources of fiber:  Apples.  Peaches.  Pears.  Berries.  Figs.  Broccoli (raw).  Cabbage.  Carrots.  Raw peas.  Kidney beans.  Lima beans.  Whole grain bread.  Whole grain cereal.  Where to find more information: [employer] for Functional Gastrointestinal Disorders: www.iffgd.[employer] of Diabetes and Digestive and Kidney Diseases: NetworkAffair.co.za.aspx This information is not intended to replace advice given to you by your health care provider. Make sure you discuss any questions you have with your health care provider. Document Released: 12/27/2003 Document Revised: 03/13/2016 Document Reviewed:  01/06/2014 Elsevier Interactive Patient Education  2018 [employer].

## 2018-08-30 ENCOUNTER — Other Ambulatory Visit: Payer: Self-pay | Admitting: Gastroenterology

## 2018-09-01 ENCOUNTER — Encounter: Payer: Self-pay | Admitting: Internal Medicine

## 2018-09-02 ENCOUNTER — Encounter: Payer: Self-pay | Admitting: Emergency Medicine

## 2018-10-11 ENCOUNTER — Other Ambulatory Visit: Payer: Self-pay | Admitting: Primary Care

## 2018-10-11 ENCOUNTER — Other Ambulatory Visit: Payer: Self-pay | Admitting: Internal Medicine

## 2018-10-11 NOTE — Telephone Encounter (Signed)
Both Rx were last prescribed by Allie Bossier on 07/14/2018 #90 with no refills. Patient had transfer to Dr McLean-Scocuzza on 08/27/2018.

## 2018-10-27 ENCOUNTER — Other Ambulatory Visit (INDEPENDENT_AMBULATORY_CARE_PROVIDER_SITE_OTHER): Payer: BLUE CROSS/BLUE SHIELD

## 2018-10-27 DIAGNOSIS — I10 Essential (primary) hypertension: Secondary | ICD-10-CM | POA: Diagnosis not present

## 2018-10-27 DIAGNOSIS — Z13818 Encounter for screening for other digestive system disorders: Secondary | ICD-10-CM | POA: Diagnosis not present

## 2018-10-27 DIAGNOSIS — Z1389 Encounter for screening for other disorder: Secondary | ICD-10-CM

## 2018-10-27 DIAGNOSIS — E785 Hyperlipidemia, unspecified: Secondary | ICD-10-CM

## 2018-10-27 DIAGNOSIS — E559 Vitamin D deficiency, unspecified: Secondary | ICD-10-CM | POA: Diagnosis not present

## 2018-10-27 DIAGNOSIS — Z1159 Encounter for screening for other viral diseases: Secondary | ICD-10-CM | POA: Diagnosis not present

## 2018-10-27 DIAGNOSIS — R7303 Prediabetes: Secondary | ICD-10-CM

## 2018-10-27 DIAGNOSIS — K760 Fatty (change of) liver, not elsewhere classified: Secondary | ICD-10-CM | POA: Diagnosis not present

## 2018-10-27 LAB — CBC WITH DIFFERENTIAL/PLATELET
Basophils Absolute: 0 10*3/uL (ref 0.0–0.1)
Basophils Relative: 0.4 % (ref 0.0–3.0)
Eosinophils Absolute: 0.1 10*3/uL (ref 0.0–0.7)
Eosinophils Relative: 1.9 % (ref 0.0–5.0)
HCT: 38.5 % (ref 36.0–46.0)
Hemoglobin: 13.2 g/dL (ref 12.0–15.0)
Lymphocytes Relative: 22.7 % (ref 12.0–46.0)
Lymphs Abs: 1.4 10*3/uL (ref 0.7–4.0)
MCHC: 34.2 g/dL (ref 30.0–36.0)
MCV: 88.7 fl (ref 78.0–100.0)
Monocytes Absolute: 0.3 10*3/uL (ref 0.1–1.0)
Monocytes Relative: 5.2 % (ref 3.0–12.0)
Neutro Abs: 4.4 10*3/uL (ref 1.4–7.7)
Neutrophils Relative %: 69.8 % (ref 43.0–77.0)
Platelets: 385 10*3/uL (ref 150.0–400.0)
RBC: 4.34 Mil/uL (ref 3.87–5.11)
RDW: 13.1 % (ref 11.5–15.5)
WBC: 6.3 10*3/uL (ref 4.0–10.5)

## 2018-10-27 LAB — LIPID PANEL
Cholesterol: 190 mg/dL (ref 0–200)
HDL: 45.8 mg/dL (ref >39.00)
LDL Cholesterol: 107 mg/dL — ABNORMAL HIGH (ref 0–99)
NonHDL: 144.11
Total CHOL/HDL Ratio: 4
Triglycerides: 187 mg/dL — ABNORMAL HIGH (ref 0.0–149.0)
VLDL: 37.4 mg/dL (ref 0.0–40.0)

## 2018-10-27 LAB — COMPREHENSIVE METABOLIC PANEL
ALT: 35 U/L (ref 0–35)
AST: 22 U/L (ref 0–37)
Albumin: 4.3 g/dL (ref 3.5–5.2)
Alkaline Phosphatase: 55 U/L (ref 39–117)
BUN: 12 mg/dL (ref 6–23)
CO2: 33 mEq/L — ABNORMAL HIGH (ref 19–32)
Calcium: 9.8 mg/dL (ref 8.4–10.5)
Chloride: 99 mEq/L (ref 96–112)
Creatinine, Ser: 0.79 mg/dL (ref 0.40–1.20)
GFR: 81.84 mL/min (ref >60.00)
Glucose, Bld: 150 mg/dL — ABNORMAL HIGH (ref 70–99)
Potassium: 3.9 mEq/L (ref 3.5–5.1)
Sodium: 142 mEq/L (ref 135–145)
Total Bilirubin: 0.5 mg/dL (ref 0.2–1.2)
Total Protein: 7.1 g/dL (ref 6.0–8.3)

## 2018-10-27 LAB — HEMOGLOBIN A1C: Hgb A1c MFr Bld: 6.9 % — ABNORMAL HIGH (ref 4.6–6.5)

## 2018-10-27 LAB — VITAMIN D 25 HYDROXY (VIT D DEFICIENCY, FRACTURES): VITD: 48.9 ng/mL (ref 30.00–100.00)

## 2018-10-28 ENCOUNTER — Encounter: Payer: Self-pay | Admitting: Internal Medicine

## 2018-10-28 LAB — URINALYSIS, ROUTINE W REFLEX MICROSCOPIC
Bilirubin Urine: NEGATIVE
Glucose, UA: NEGATIVE
Hgb urine dipstick: NEGATIVE
Nitrite: NEGATIVE
Protein, ur: NEGATIVE
RBC / HPF: NONE SEEN /HPF (ref 0–2)
Specific Gravity, Urine: 1.024 (ref 1.001–1.03)
pH: 6 (ref 5.0–8.0)

## 2018-10-28 LAB — HEPATITIS B SURFACE ANTIBODY, QUANTITATIVE: Hep B S AB Quant (Post): <5 m[IU]/mL — ABNORMAL LOW (ref >=10)

## 2018-10-28 LAB — HEPATITIS A ANTIBODY, TOTAL: Hepatitis A AB,Total: NONREACTIVE

## 2018-10-29 ENCOUNTER — Other Ambulatory Visit (INDEPENDENT_AMBULATORY_CARE_PROVIDER_SITE_OTHER): Payer: BLUE CROSS/BLUE SHIELD

## 2018-10-29 ENCOUNTER — Other Ambulatory Visit: Payer: Self-pay | Admitting: Internal Medicine

## 2018-10-29 DIAGNOSIS — Z1329 Encounter for screening for other suspected endocrine disorder: Secondary | ICD-10-CM | POA: Diagnosis not present

## 2018-10-29 DIAGNOSIS — R946 Abnormal results of thyroid function studies: Secondary | ICD-10-CM

## 2018-10-29 LAB — T4, FREE: Free T4: 0.86 ng/dL (ref 0.60–1.60)

## 2018-10-29 LAB — TSH: TSH: 2.43 u[IU]/mL (ref 0.35–4.50)

## 2018-11-03 ENCOUNTER — Telehealth: Payer: Self-pay | Admitting: *Deleted

## 2018-11-03 NOTE — Telephone Encounter (Signed)
Copied from [place] [id number]. Topic: Quick Communication - Lab Results (Clinic Use ONLY) >> Nov 02, 2018 11:01 AM Babs Bertin, CMA wrote: Called patient to inform them of [id number] lab results. When patient returns call, triage nurse may disclose results. >> Nov 03, 2018  1:35 PM Bea Graff, NT wrote: PT states she reviewed her results on her Mychart and will also discuss with Dr. Aundra Dubin at her appt tomorrow.

## 2018-11-04 ENCOUNTER — Encounter: Payer: Self-pay | Admitting: Internal Medicine

## 2018-11-04 ENCOUNTER — Ambulatory Visit: Payer: BLUE CROSS/BLUE SHIELD | Admitting: Internal Medicine

## 2018-11-04 VITALS — BP 136/84 | HR 89 | Temp 98.4°F | Ht 60.0 in | Wt 172.6 lb

## 2018-11-04 DIAGNOSIS — E119 Type 2 diabetes mellitus without complications: Secondary | ICD-10-CM

## 2018-11-04 DIAGNOSIS — E669 Obesity, unspecified: Secondary | ICD-10-CM

## 2018-11-04 DIAGNOSIS — Z23 Encounter for immunization: Secondary | ICD-10-CM

## 2018-11-04 DIAGNOSIS — R300 Dysuria: Secondary | ICD-10-CM | POA: Diagnosis not present

## 2018-11-04 DIAGNOSIS — Z1231 Encounter for screening mammogram for malignant neoplasm of breast: Secondary | ICD-10-CM

## 2018-11-04 DIAGNOSIS — R42 Dizziness and giddiness: Secondary | ICD-10-CM

## 2018-11-04 DIAGNOSIS — R253 Fasciculation: Secondary | ICD-10-CM | POA: Insufficient documentation

## 2018-11-04 DIAGNOSIS — K760 Fatty (change of) liver, not elsewhere classified: Secondary | ICD-10-CM

## 2018-11-04 DIAGNOSIS — J309 Allergic rhinitis, unspecified: Secondary | ICD-10-CM

## 2018-11-04 DIAGNOSIS — E785 Hyperlipidemia, unspecified: Secondary | ICD-10-CM

## 2018-11-04 MED ORDER — PHENTERMINE HCL 37.5 MG PO TABS: 37.5000 mg | ORAL_TABLET | Freq: Every day | ORAL | 0 refills | Status: DC

## 2018-11-04 MED ORDER — METFORMIN HCL ER 500 MG PO TB24: 500.0000 mg | ORAL_TABLET | Freq: Every day | ORAL | 11 refills | Status: DC

## 2018-11-04 NOTE — Patient Instructions (Addendum)
Hepatitis A; Hepatitis B Vaccine injection What is this medicine? HEPATITIS A VACCINE; HEPATITIS B VACCINE (hep uh TAHY tis A vak SEEN; hep uh TAHY tis B vak SEEN) is a vaccine to protect from an infection with the hepatitis A and B virus. This vaccine does not contain the live viruses. It will not cause a hepatitis infection. This medicine may be used for other purposes; ask your health care provider or pharmacist if you have questions. COMMON BRAND NAME(S): Twinrix What should I tell my health care provider before I take this medicine? They need to know if you have any of these conditions: -bleeding disorder -fever or infection -heart disease -immune system problems -an unusual or allergic reaction to hepatitis A or B vaccine, neomycin, yeast, thimerosal, other medicines, foods, dyes, or preservatives -pregnant or trying to get pregnant -breast-feeding How should I use this medicine? This vaccine is for injection into a muscle. It is given by a health care professional. A copy of Vaccine Information Statements will be given before each vaccination. Read this sheet carefully each time. The sheet may change frequently. Talk to your pediatrician regarding the use of this medicine in children. Special care may be needed. Overdosage: If you think you have taken too much of this medicine contact a poison control center or emergency room at once. NOTE: This medicine is only for you. Do not share this medicine with others. What if I miss a dose? It is important not to miss your dose. Call your doctor or health care professional if you are unable to keep an appointment. What may interact with this medicine? -medicines that suppress your immune function like adalimumab, anakinra, infliximab -medicines to treat cancer -steroid medicines like prednisone or cortisone This list may not describe all possible interactions. Give your health care provider a list of all the medicines, herbs,  non-prescription drugs, or dietary supplements you use. Also tell them if you smoke, drink alcohol, or use illegal drugs. Some items may interact with your medicine. What should I watch for while using this medicine? See your health care provider for all shots of this vaccine as directed. You must have 3 to 4 shots of this vaccine for protection from hepatitis A and B infection. Tell your doctor right away if you have any serious or unusual side effects after getting this vaccine. What side effects may I notice from receiving this medicine? Side effects that you should report to your doctor or health care professional as soon as possible: -allergic reactions like skin rash, itching or hives, swelling of the face, lips, or tongue -breathing problems -confused, irritated -fast, irregular heartbeat -flu-like syndrome -numb, tingling pain -seizures Side effects that usually do not require medical attention (report to your doctor or health care professional if they continue or are bothersome): -diarrhea -fever -headache -loss of appetite -muscle pain -nausea -pain, redness, swelling, or irritation at site where injected -tiredness This list may not describe all possible side effects. Call your doctor for medical advice about side effects. You may report side effects to FDA at 1-800-FDA-1088. Where should I keep my medicine? This drug is given in a hospital or clinic and will not be stored at home. NOTE: This sheet is a summary. It may not cover all possible information. If you have questions about this medicine, talk to your doctor, pharmacist, or health care provider.  2019 Elsevier/Gold Standard (2008-02-18 16:38:46)  Nonalcoholic Fatty Liver Disease Diet Nonalcoholic fatty liver disease is a condition that causes fat to  accumulate in and around the liver. The disease makes it harder for the liver to work the way that it should. Following a healthy diet can help to keep nonalcoholic fatty  liver disease under control. It can also help to prevent or improve conditions that are associated with the disease, such as heart disease, diabetes, high blood pressure, and abnormal cholesterol levels. Along with regular exercise, this diet:  Promotes weight loss.  Helps to control blood sugar levels.  Helps to improve the way that the body uses insulin. What do I need to know about this diet?  Use the glycemic index (GI) to plan your meals. The index tells you how quickly a food will raise your blood sugar. Choose low-GI foods. These foods take a longer time to raise blood sugar.  Keep track of how many calories you take in. Eating the right amount of calories will help you to achieve a healthy weight.  You may want to follow a Mediterranean diet. This diet includes a lot of vegetables, lean meats or fish, whole grains, fruits, and healthy oils and fats. What foods can I eat? Grains Whole grains, such as whole-wheat or whole-grain breads, crackers, tortillas, cereals, and pasta. Stone-ground whole wheat. Pumpernickel bread. Unsweetened oatmeal. Bulgur. Barley. Quinoa. Brown or wild rice. Corn or whole-wheat flour tortillas. Vegetables Lettuce. Spinach. Peas. Beets. Cauliflower. Cabbage. Broccoli. Carrots. Tomatoes. Squash. Eggplant. Herbs. Peppers. Onions. Cucumbers. Brussels sprouts. Yams and sweet potatoes. Beans. Lentils. Fruits Bananas. Apples. Oranges. Grapes. Papaya. Mango. Pomegranate. Kiwi. Grapefruit. Cherries. Meats and Other Protein Sources Seafood and shellfish. Lean meats. Poultry. Tofu. Dairy Low-fat or fat-free dairy products, such as yogurt, cottage cheese, and cheese. Beverages Water. Sugar-free drinks. Tea. Coffee. Low-fat or skim milk. Milk alternatives, such as soy or almond milk. Real fruit juice. Condiments Mustard. Relish. Low-fat, low-sugar ketchup and barbecue sauce. Low-fat or fat-free mayonnaise. Sweets and Desserts Sugar-free sweets. Fats and  Oils Avocado. Canola or olive oil. Nuts and nut butters. Seeds. The items listed above may not be a complete list of recommended foods or beverages. Contact your dietitian for more options. What foods are not recommended? Palm oil and coconut oil. Processed foods. Fried foods. Sweetened drinks, such as sweet tea, milkshakes, snow cones, iced sweet drinks, and sodas. Alcohol. Sweets. Foods that contain a lot of salt or sodium. The items listed above may not be a complete list of foods and beverages to avoid. Contact your dietitian for more information. This information is not intended to replace advice given to you by your health care provider. Make sure you discuss any questions you have with your health care provider. Document Released: 02/20/2015 Document Revised: 03/13/2016 Document Reviewed: 10/31/2014 Elsevier Interactive Patient Education  2019 Elsevier Inc.  Fatty Liver Disease  Fatty liver disease occurs when too much fat has built up in your liver cells. Fatty liver disease is also called hepatic steatosis or steatohepatitis. The liver removes harmful substances from your bloodstream and produces fluids that your body needs. It also helps your body use and store energy from the food you eat. In many cases, fatty liver disease does not cause symptoms or problems. It is often diagnosed when tests are being done for other reasons. However, over time, fatty liver can cause inflammation that may lead to more serious liver problems, such as scarring of the liver (cirrhosis) and liver failure. Fatty liver is associated with insulin resistance, increased body fat, high blood pressure (hypertension), and high cholesterol. These are features of metabolic syndrome and  increase your risk for stroke, diabetes, and heart disease. What are the causes? This condition may be caused by:  Drinking too much alcohol.  Poor nutrition.  Obesity.  Cushing's syndrome.  Diabetes.  High  cholesterol.  Certain drugs.  Poisons.  Some viral infections.  Pregnancy. What increases the risk? You are more likely to develop this condition if you:  Abuse alcohol.  Are overweight.  Have diabetes.  Have hepatitis.  Have a high triglyceride level.  Are pregnant. What are the signs or symptoms? Fatty liver disease often does not cause symptoms. If symptoms do develop, they can include:  Fatigue.  Weakness.  Weight loss.  Confusion.  Abdominal pain.  Nausea and vomiting.  Yellowing of your skin and the white parts of your eyes (jaundice).  Itchy skin. How is this diagnosed? This condition may be diagnosed by:  A physical exam and medical history.  Blood tests.  Imaging tests, such as an ultrasound, CT scan, or MRI.  A liver biopsy. A small sample of liver tissue is removed using a needle. The sample is then looked at under a microscope. How is this treated? Fatty liver disease is often caused by other health conditions. Treatment for fatty liver may involve medicines and lifestyle changes to manage conditions such as:  Alcoholism.  High cholesterol.  Diabetes.  Being overweight or obese. Follow these instructions at home:   Do not drink alcohol. If you have trouble quitting, ask your health care provider how to safely quit with the help of medicine or a supervised program. This is important to keep your condition from getting worse.  Eat a healthy diet as told by your health care provider. Ask your health care provider about working with a diet and nutrition specialist (dietitian) to develop an eating plan.  Exercise regularly. This can help you lose weight and control your cholesterol and diabetes. Talk to your health care provider about an exercise plan and which activities are best for you.  Take over-the-counter and prescription medicines only as told by your health care provider.  Keep all follow-up visits as told by your health care  provider. This is important. Contact a health care provider if: You have trouble controlling your:  Blood sugar. This is especially important if you have diabetes.  Cholesterol.  Drinking of alcohol. Get help right away if:  You have abdominal pain.  You have jaundice.  You have nausea and vomiting.  You vomit blood or material that looks like coffee grounds.  You have stools that are black, tar-like, or bloody. Summary  Fatty liver disease develops when too much fat builds up in the cells of your liver.  Fatty liver disease often causes no symptoms or problems. However, over time, fatty liver can cause inflammation that may lead to more serious liver problems, such as scarring of the liver (cirrhosis).  You are more likely to develop this condition if you abuse alcohol, are pregnant, are overweight, have diabetes, have hepatitis, or have high triglyceride levels.  Contact your health care provider if you have trouble controlling your weight, blood sugar, cholesterol, or drinking of alcohol. This information is not intended to replace advice given to you by your health care provider. Make sure you discuss any questions you have with your health care provider. Document Released: 11/21/2005 Document Revised: 07/15/2017 Document Reviewed: 07/15/2017 Elsevier Interactive Patient Education  2019 [place].  Kidney Stones  Kidney stones (urolithiasis) are solid, rock-like deposits that form inside of the organs that  make urine (kidneys). A kidney stone may form in a kidney and move into the bladder, where it can cause intense pain and block the flow of urine. Kidney stones are created when high levels of certain minerals are found in the urine. They are usually passed through urination, but in some cases, medical treatment may be needed to remove them. What are the causes? Kidney stones may be caused by:  A condition in which certain glands produce too much parathyroid hormone  (primary hyperparathyroidism), which causes too much calcium buildup in the blood.  Buildup of uric acid crystals in the bladder (hyperuricosuria). Uric acid is a chemical that the body produces when you eat certain foods. It usually exits the body in the urine.  Narrowing (stricture) of one or both of the tubes that drain urine from the kidneys to the bladder (ureters).  A kidney blockage that is present at birth (congenital obstruction).  Past surgery on the kidney or the ureters, such as gastric bypass surgery. What increases the risk? The following factors make you more likely to develop kidney stones:  Having had a kidney stone in the past.  Having a family history of kidney stones.  Not drinking enough water.  Eating a diet that is high in protein, salt (sodium), or sugar.  Being overweight or obese. What are the signs or symptoms? Symptoms of a kidney stone may include:  Nausea.  Vomiting.  Blood in the urine (hematuria).  Pain in the side of the abdomen, right below the ribs (flank pain). Pain usually spreads (radiates) to the groin.  Needing to urinate frequently or urgently. How is this diagnosed? This condition may be diagnosed based on:  Your medical history.  A physical exam.  Blood tests.  Urine tests.  CT scan.  Abdominal X-ray.  A procedure to examine the inside of the bladder (cystoscopy). How is this treated? Treatment for kidney stones depends on the size, location, and makeup of the stones. Treatment may involve:  Analyzing your urine before and after you pass the stone through urination.  Being monitored at the hospital until you pass the stone through urination.  Increasing your fluid intake and decreasing the amount of calcium and protein in your diet.  A procedure to break up kidney stones in the bladder using: ? A focused beam of light (laser therapy). ? Shock waves (extracorporeal shock wave lithotripsy).  Surgery to remove  kidney stones. This may be needed if you have severe pain or have stones that block your urinary tract. Follow these instructions at home: Eating and drinking  Drink enough fluid to keep your urine clear or pale yellow. This will help you to pass the kidney stone.  If directed, change your diet. This may include: ? Limiting how much sodium you eat. ? Eating more fruits and vegetables. ? Limiting how much meat, poultry, fish, and eggs you eat.  Follow instructions from your health care provider about eating or drinking restrictions. General instructions  Collect urine samples as told by your health care provider. You may need to collect a urine sample: ? 24 hours after you pass the stone. ? 8-12 weeks after passing the kidney stone, and every 6-12 months after that.  Strain your urine every time you urinate, for as long as directed. Use the strainer that your health care provider recommends.  Do not throw out the kidney stone after passing it. Keep the stone so it can be tested by your health care provider. Testing the  makeup of your kidney stone may help prevent you from getting kidney stones in the future.  Take over-the-counter and prescription medicines only as told by your health care provider.  Keep all follow-up visits as told by your health care provider. This is important. You may need follow-up X-rays or ultrasounds to make sure that your stone has passed. How is this prevented? To prevent another kidney stone:  Drink enough fluid to keep your urine clear or pale yellow. This is the best way to prevent kidney stones.  Eat a healthy diet and follow recommendations from your health care provider about foods to avoid. You may be instructed to eat a low-protein diet. Recommendations vary depending on the type of kidney stone that you have.  Maintain a healthy weight. Contact a health care provider if:  You have pain that gets worse or does not get better with medicine. Get  help right away if:  You have a fever or chills.  You develop severe pain.  You develop new abdominal pain.  You faint.  You are unable to urinate. This information is not intended to replace advice given to you by your health care provider. Make sure you discuss any questions you have with your health care provider. Document Released: 10/06/2005 Document Revised: 03/19/2017 Document Reviewed: 03/21/2016 Elsevier Interactive Patient Education  2019 [place].  Dietary Guidelines to Help Prevent Kidney Stones Kidney stones are deposits of minerals and salts that form inside your kidneys. Your risk of developing kidney stones may be greater depending on your diet, your lifestyle, the medicines you take, and whether you have certain medical conditions. Most people can reduce their chances of developing kidney stones by following the instructions below. Depending on your overall health and the type of kidney stones you tend to develop, your dietitian may give you more specific instructions. What are tips for following this plan? Reading food labels  Choose foods with "no salt added" or "low-salt" labels. Limit your sodium intake to less than 1500 mg per day.  Choose foods with calcium for each meal and snack. Try to eat about 300 mg of calcium at each meal. Foods that contain 200-500 mg of calcium per serving include: ? 8 oz (237 ml) of milk, fortified nondairy milk, and fortified fruit juice. ? 8 oz (237 ml) of kefir, yogurt, and soy yogurt. ? 4 oz (118 ml) of tofu. ? 1 oz of cheese. ? 1 cup (300 g) of dried figs. ? 1 cup (91 g) of cooked broccoli. ? 1-3 oz can of sardines or mackerel.  Most people need 1000 to 1500 mg of calcium each day. Talk to your dietitian about how much calcium is recommended for you. Shopping  Buy plenty of fresh fruits and vegetables. Most people do not need to avoid fruits and vegetables, even if they contain nutrients that may contribute to kidney  stones.  When shopping for convenience foods, choose: ? Whole pieces of fruit. ? Premade salads with dressing on the side. ? Low-fat fruit and yogurt smoothies.  Avoid buying frozen meals or prepared deli foods.  Look for foods with live cultures, such as yogurt and kefir. Cooking  Do not add salt to food when cooking. Place a salt shaker on the table and allow each person to add his or her own salt to taste.  Use vegetable protein, such as beans, textured vegetable protein (TVP), or tofu instead of meat in pasta, casseroles, and soups. Meal planning   Eat less salt,  if told by your dietitian. To do this: ? Avoid eating processed or premade food. ? Avoid eating fast food.  Eat less animal protein, including cheese, meat, poultry, or fish, if told by your dietitian. To do this: ? Limit the number of times you have meat, poultry, fish, or cheese each week. Eat a diet free of meat at least 2 days a week. ? Eat only one serving each day of meat, poultry, fish, or seafood. ? When you prepare animal protein, cut pieces into small portion sizes. For most meat and fish, one serving is about the size of one deck of cards.  Eat at least 5 servings of fresh fruits and vegetables each day. To do this: ? Keep fruits and vegetables on hand for snacks. ? Eat 1 piece of fruit or a handful of berries with breakfast. ? Have a salad and fruit at lunch. ? Have two kinds of vegetables at dinner.  Limit foods that are high in a substance called oxalate. These include: ? Spinach. ? Rhubarb. ? Beets. ? Potato chips and french fries. ? Nuts.  If you regularly take a diuretic medicine, make sure to eat at least 1-2 fruits or vegetables high in potassium each day. These include: ? Avocado. ? Banana. ? Orange, prune, carrot, or tomato juice. ? Baked potato. ? Cabbage. ? Beans and split peas. General instructions   Drink enough fluid to keep your urine clear or pale yellow. This is the most  important thing you can do.  Talk to your health care provider and dietitian about taking daily supplements. Depending on your health and the cause of your kidney stones, you may be advised: ? Not to take supplements with vitamin C. ? To take a calcium supplement. ? To take a daily probiotic supplement. ? To take other supplements such as magnesium, fish oil, or vitamin B6.  Take all medicines and supplements as told by your health care provider.  Limit alcohol intake to no more than 1 drink a day for nonpregnant women and 2 drinks a day for men. One drink equals 12 oz of beer, 5 oz of wine, or 1 oz of hard liquor.  Lose weight if told by your health care provider. Work with your dietitian to find strategies and an eating plan that works best for you. What foods are not recommended? Limit your intake of the following foods, or as told by your dietitian. Talk to your dietitian about specific foods you should avoid based on the type of kidney stones and your overall health. Grains Breads. Bagels. Rolls. Baked goods. Salted crackers. Cereal. Pasta. Vegetables Spinach. Rhubarb. Beets. Canned vegetables. Angie Fava. Olives. Meats and other protein foods Nuts. Nut butters. Large portions of meat, poultry, or fish. Salted or cured meats. Deli meats. Hot dogs. Sausages. Dairy Cheese. Beverages Regular soft drinks. Regular vegetable juice. Seasonings and other foods Seasoning blends with salt. Salad dressings. Canned soups. Soy sauce. Ketchup. Barbecue sauce. Canned pasta sauce. Casseroles. Pizza. Lasagna. Frozen meals. Potato chips. [place] fries. Summary  You can reduce your risk of kidney stones by making changes to your diet.  The most important thing you can do is drink enough fluid. You should drink enough fluid to keep your urine clear or pale yellow.  Ask your health care provider or dietitian how much protein from animal sources you should eat each day, and also how much salt and  calcium you should have each day. This information is not intended to replace advice given  to you by your health care provider. Make sure you discuss any questions you have with your health care provider. Document Released: 01/31/2011 Document Revised: 09/16/2016 Document Reviewed: 09/16/2016 Elsevier Interactive Patient Education  2019 Elsevier Inc.  Cholesterol Cholesterol is a white, waxy, fat-like substance that is needed by the human body in small amounts. The liver makes all the cholesterol we need. Cholesterol is carried from the liver by the blood through the blood vessels. Deposits of cholesterol (plaques) may build up on blood vessel (artery) walls. Plaques make the arteries narrower and stiffer. Cholesterol plaques increase the risk for heart attack and stroke. You cannot feel your cholesterol level even if it is very high. The only way to know that it is high is to have a blood test. Once you know your cholesterol levels, you should keep a record of the test results. Work with your health care provider to keep your levels in the desired range. What do the results mean?  Total cholesterol is a rough measure of all the cholesterol in your blood.  LDL (low-density lipoprotein) is the "bad" cholesterol. This is the type that causes plaque to build up on the artery walls. You want this level to be low.  HDL (high-density lipoprotein) is the "good" cholesterol because it cleans the arteries and carries the LDL away. You want this level to be high.  Triglycerides are fat that the body can either burn for energy or store. High levels are closely linked to heart disease. What are the desired levels of cholesterol?  Total cholesterol below 200.  LDL below 100 for people who are at risk, below 70 for people at very high risk.  HDL above 40 is good. A level of 60 or higher is considered to be protective against heart disease.  Triglycerides below 150. How can I lower my  cholesterol? Diet Follow your diet program as told by your health care provider.  Choose fish or white meat chicken and [place], roasted or baked. Limit fatty cuts of red meat, fried foods, and processed meats, such as sausage and lunch meats.  Eat lots of fresh fruits and vegetables.  Choose whole grains, beans, pasta, potatoes, and cereals.  Choose olive oil, corn oil, or canola oil, and use only small amounts.  Avoid butter, mayonnaise, shortening, or palm kernel oils.  Avoid foods with trans fats.  Drink skim or nonfat milk and eat low-fat or nonfat yogurt and cheeses. Avoid whole milk, cream, ice cream, egg yolks, and full-fat cheeses.  Healthier desserts include angel food cake, ginger snaps, animal crackers, hard candy, popsicles, and low-fat or nonfat frozen yogurt. Avoid pastries, cakes, pies, and cookies.  Exercise  Follow your exercise program as told by your health care provider. A regular program: ? Helps to decrease LDL and raise HDL. ? Helps with weight control.  Do things that increase your activity level, such as gardening, walking, and taking the stairs.  Ask your health care provider about ways that you can be more active in your daily life. Medicine  Take over-the-counter and prescription medicines only as told by your health care provider. ? Medicine may be prescribed by your health care provider to help lower cholesterol and decrease the risk for heart disease. This is usually done if diet and exercise have failed to bring down cholesterol levels. ? If you have several risk factors, you may need medicine even if your levels are normal. This information is not intended to replace advice given to you by  your health care provider. Make sure you discuss any questions you have with your health care provider. Document Released: 07/01/2001 Document Revised: 05/03/2016 Document Reviewed: 04/05/2016 Elsevier Interactive Patient Education  [employer].

## 2018-11-04 NOTE — Progress Notes (Signed)
Pre visit review using our clinic review tool, if applicable. No additional management support is needed unless otherwise documented below in the visit note. 

## 2018-11-04 NOTE — Progress Notes (Signed)
Chief Complaint  Patient presents with  . Follow-up   1. Obesity with weight loss on adipex x 2 months 2. DM 2 A1C 6.9 10/27/2018 with h/o gestational dm with, HLD  3. Dizziness and muscle twitching intermittently but often with stressors x long term declines neurology referral for now  4. Allergic rhinitis on zyrtec not using flonase for now wants ears checked for fluid today 5. Fatty liver reviewed fatty liver syndrome today  6. Abnormal urine with dysuria repeat urine   Review of Systems  Constitutional: Positive for weight loss.  HENT: Negative for hearing loss.   Eyes: Negative for blurred vision.  Respiratory: Negative for shortness of breath.   Cardiovascular: Negative for chest pain.  Gastrointestinal: Negative for abdominal pain.  Genitourinary: Positive for dysuria.  Musculoskeletal: Negative for falls.  Skin: Negative for rash.  Neurological: Positive for dizziness.       Muscle twitching    Psychiatric/Behavioral: Negative for depression.       +stress     Past Medical History:  Diagnosis Date  . Allergy   . Essential hypertension   . Fatty liver   . GERD (gastroesophageal reflux disease)   . Gestational diabetes   . Hyperlipidemia   . IBS (irritable bowel syndrome)   . Kidney stones    x 3 occurrences   . Melanoma ([place])   . Obesity (BMI 30-39.9)   . Renal disorder    Past Surgical History:  Procedure Laterality Date  . BREAST REDUCTION SURGERY    . CESAREAN SECTION    . LITHOTRIPSY     x1  . MELANOMA EXCISION     Right arm  . NASAL SINUS SURGERY    . TONSILLECTOMY     Family History  Problem Relation Age of Onset  . Heart attack Mother   . Hypertension Mother   . Heart disease Mother        MI  . Diabetes Father   . Prostate cancer Father   . Breast cancer Sister    Social History   Socioeconomic History  . Marital status: Married    Spouse name: Not on file  . Number of children: Not on file  . Years of education: Not on file  .  Highest education level: Not on file  Occupational History  . Not on file  Social Needs  . Financial resource strain: Not on file  . Food insecurity:    Worry: Not on file    Inability: Not on file  . Transportation needs:    Medical: Not on file    Non-medical: Not on file  Tobacco Use  . Smoking status: Never Smoker  . Smokeless tobacco: Never Used  Substance and Sexual Activity  . Alcohol use: No  . Drug use: No  . Sexual activity: Not on file  Lifestyle  . Physical activity:    Days per week: Not on file    Minutes per session: Not on file  . Stress: Not on file  Relationships  . Social connections:    Talks on phone: Not on file    Gets together: Not on file    Attends religious service: Not on file    Active member of club or organization: Not on file    Attends meetings of clubs or organizations: Not on file    Relationship status: Not on file  . Intimate partner violence:    Fear of current or ex partner: Not on file  Emotionally abused: Not on file    Physically abused: Not on file    Forced sexual activity: Not on file  Other Topics Concern  . Not on file  Social History Narrative   Married since 41 y.o    2 children.   No grandchildren.   Self employed.   Enjoys shopping, decorating.    Current Meds  Medication Sig  . bifidobacterium infantis (ALIGN) capsule Take by mouth.  . cetirizine (ZYRTEC) 10 MG tablet Take 10 mg by mouth daily.  . ferrous sulfate 325 (65 FE) MG tablet Take 325 mg by mouth daily with breakfast.  . FLUoxetine (PROZAC) 20 MG tablet Take 20 mg by mouth daily.  . hydrochlorothiazide (HYDRODIURIL) 12.5 MG tablet TAKE 1 TABLET BY MOUTH ONCE DAILY  . losartan (COZAAR) 50 MG tablet TAKE 1 TABLET BY MOUTH ONCE DAILY  . Multiple Vitamin (MULTIVITAMIN) tablet Take 1 tablet by mouth daily.  . Omega-3 1000 MG CAPS Take by mouth.  Marland Kitchen omeprazole (PRILOSEC) 20 MG capsule TAKE 1 CAPSULE BY MOUTH TWICE DAILY BEFORE MEAL(S)   No Known  Allergies Recent Results (from the past 2160 hour(s))  H Pylori, IGM, IGG, IGA AB     Status: None   Collection Time: 08/09/18  1:51 PM  Result Value Ref Range   H. pylori, IgG AbS 0.33 0.00 - 0.79 Index Value    Comment:                              Negative           <0.80                              Equivocal    0.80 - 0.89                              Positive           >0.89    H. pylori, IgA Abs <9.0 0.0 - 8.9 units    Comment:                                 Negative          <9.0                                 Equivocal   9.0 - 11.0                                 Positive         >11.0    H pylori, IgM Abs <9.0 0.0 - 8.9 units    Comment:                                 Negative          <9.0                                 Equivocal   9.0 - 11.0  Positive         >11.0 This test was developed and its performance characteristics determined by [facility]. It has not been cleared or approved by the Food and Drug Administration.   Hepatitis A Ab, Total     Status: None   Collection Time: 10/27/18  8:04 AM  Result Value Ref Range   Hepatitis A AB,Total NON-REACTIVE NON-REACTI    Comment: For additional information, please refer to  http://education.[facility].com/faq/FAQ202  (This link is being provided for informational/ educational purposes only.)   Hepatitis B surface antibody,quantitative     Status: Abnormal   Collection Time: 10/27/18  8:04 AM  Result Value Ref Range   Hepatitis B-Post <5 (L) > OR = 10 mIU/mL    Comment: . Patient does not have immunity to hepatitis B virus. . For additional information, please refer to http://education.[facility].com/faq/FAQ105 (This link is being provided for informational/ educational purposes only).   Vitamin D (25 hydroxy)     Status: None   Collection Time: 10/27/18  8:04 AM  Result Value Ref Range   VITD 48.90 30.00 - 100.00 ng/mL  Urinalysis, Routine w reflex microscopic      Status: Abnormal   Collection Time: 10/27/18  8:04 AM  Result Value Ref Range   Color, Urine DARK YELLOW YELLOW   APPearance CLOUDY (A) CLEAR   Specific Gravity, Urine 1.024 1.001 - 1.03   pH 6.0 5.0 - 8.0   Glucose, UA NEGATIVE NEGATIVE   Bilirubin Urine NEGATIVE NEGATIVE   Ketones, ur TRACE (A) NEGATIVE   Hgb urine dipstick NEGATIVE NEGATIVE   Protein, ur NEGATIVE NEGATIVE   Nitrite NEGATIVE NEGATIVE   Leukocytes, UA 2+ (A) NEGATIVE   WBC, UA 10-20 (A) 0 - 5 /HPF   RBC / HPF NONE SEEN 0 - 2 /HPF   Squamous Epithelial / LPF 6-10 (A) < OR = 5 /HPF   Bacteria, UA MANY (A) NONE SEEN /HPF   AMORPHOUS SEDIMENT MANY (A) NONE OR FE /HPF   Hyaline Cast 0-5 (A) NONE SEEN /LPF   Yeast FEW (A) NONE SEEN /HPF  Hemoglobin A1c     Status: Abnormal   Collection Time: 10/27/18  8:04 AM  Result Value Ref Range   Hgb A1c MFr Bld 6.9 (H) 4.6 - 6.5 %    Comment: Glycemic Control Guidelines for People with Diabetes:Non Diabetic:  <6%Goal of Therapy: <7%Additional Action Suggested:  >8%   Lipid panel     Status: Abnormal   Collection Time: 10/27/18  8:04 AM  Result Value Ref Range   Cholesterol 190 0 - 200 mg/dL    Comment: ATP III Classification       Desirable:  < 200 mg/dL               Borderline High:  200 - 239 mg/dL          High:  > = 240 mg/dL   Triglycerides 187.0 (H) 0.0 - 149.0 mg/dL    Comment: Normal:  <150 mg/dLBorderline High:  150 - 199 mg/dL   HDL 45.80 >39.00 mg/dL   VLDL 37.4 0.0 - 40.0 mg/dL   LDL Cholesterol 107 (H) 0 - 99 mg/dL   Total CHOL/HDL Ratio 4     Comment:                Men          Women1/2 Average Risk     3.4          3.3Average  Risk          5.0          4.42X Average Risk          9.6          7.13X Average Risk          15.0          11.0                       NonHDL 144.11     Comment: NOTE:  Non-HDL goal should be 30 mg/dL higher than patient's LDL goal (i.e. LDL goal of < 70 mg/dL, would have non-HDL goal of < 100 mg/dL)  CBC with Differential/Platelet      Status: None   Collection Time: 10/27/18  8:04 AM  Result Value Ref Range   WBC 6.3 4.0 - 10.5 K/uL   RBC 4.34 3.87 - 5.11 Mil/uL   Hemoglobin 13.2 12.0 - 15.0 g/dL   HCT 38.5 36.0 - 46.0 %   MCV 88.7 78.0 - 100.0 fl   MCHC 34.2 30.0 - 36.0 g/dL   RDW 13.1 11.5 - 15.5 %   Platelets 385.0 150.0 - 400.0 K/uL   Neutrophils Relative % 69.8 43.0 - 77.0 %   Lymphocytes Relative 22.7 12.0 - 46.0 %   Monocytes Relative 5.2 3.0 - 12.0 %   Eosinophils Relative 1.9 0.0 - 5.0 %   Basophils Relative 0.4 0.0 - 3.0 %   Neutro Abs 4.4 1.4 - 7.7 K/uL   Lymphs Abs 1.4 0.7 - 4.0 K/uL   Monocytes Absolute 0.3 0.1 - 1.0 K/uL   Eosinophils Absolute 0.1 0.0 - 0.7 K/uL   Basophils Absolute 0.0 0.0 - 0.1 K/uL  Comprehensive metabolic panel     Status: Abnormal   Collection Time: 10/27/18  8:04 AM  Result Value Ref Range   Sodium 142 135 - 145 mEq/L   Potassium 3.9 3.5 - 5.1 mEq/L   Chloride 99 96 - 112 mEq/L   CO2 33 (H) 19 - 32 mEq/L   Glucose, Bld 150 (H) 70 - 99 mg/dL   BUN 12 6 - 23 mg/dL   Creatinine, Ser 0.79 0.40 - 1.20 mg/dL   Total Bilirubin 0.5 0.2 - 1.2 mg/dL   Alkaline Phosphatase 55 39 - 117 U/L   AST 22 0 - 37 U/L   ALT 35 0 - 35 U/L   Total Protein 7.1 6.0 - 8.3 g/dL   Albumin 4.3 3.5 - 5.2 g/dL   Calcium 9.8 8.4 - 10.5 mg/dL   GFR 81.84 >60.00 mL/min  T4, free     Status: None   Collection Time: 10/29/18  3:46 PM  Result Value Ref Range   Free T4 0.86 0.60 - 1.60 ng/dL    Comment: Specimens from patients who are undergoing biotin therapy and /or ingesting biotin supplements may contain high levels of biotin.  The higher biotin concentration in these specimens interferes with this Free T4 assay.  Specimens that contain high levels  of biotin may cause false high results for this Free T4 assay.  Please interpret results in light of the total clinical presentation of the patient.    TSH     Status: None   Collection Time: 10/29/18  3:46 PM  Result Value Ref Range   TSH 2.43 0.35  - 4.50 uIU/mL   Objective  Body mass index is 33.71 kg/m. Wt Readings from Last 3 Encounters:  11/04/18  172 lb 9.6 oz (78.3 kg)  08/27/18 187 lb (84.8 kg)  08/09/18 187 lb (84.8 kg)   Temp Readings from Last 3 Encounters:  11/04/18 98.4 F (36.9 C)  08/27/18 98.4 F (36.9 C) (Oral)  03/25/18 98.2 F (36.8 C) (Oral)   BP Readings from Last 3 Encounters:  11/04/18 136/84  08/27/18 124/84  08/09/18 128/77   Pulse Readings from Last 3 Encounters:  11/04/18 89  08/27/18 78  08/09/18 (!) 102    Physical Exam Vitals signs and nursing note reviewed.  Constitutional:      Appearance: Normal appearance. She is well-developed. She is obese.  HENT:     Head: Normocephalic and atraumatic.     Nose: Nose normal.     Mouth/Throat:     Mouth: Mucous membranes are moist.     Pharynx: Oropharynx is clear.  Eyes:     Conjunctiva/sclera: Conjunctivae normal.     Pupils: Pupils are equal, round, and reactive to light.  Cardiovascular:     Rate and Rhythm: Normal rate and regular rhythm.     Heart sounds: Normal heart sounds.  Pulmonary:     Effort: Pulmonary effort is normal.     Breath sounds: Normal breath sounds.  Skin:    General: Skin is warm and dry.  Neurological:     General: No focal deficit present.     Mental Status: She is alert and oriented to person, place, and time. Mental status is at baseline.     Gait: Gait normal.  Psychiatric:        Attention and Perception: Attention and perception normal.        Mood and Affect: Mood and affect normal.        Speech: Speech normal.        Behavior: Behavior normal. Behavior is cooperative.        Thought Content: Thought content normal.        Cognition and Memory: Cognition and memory normal.        Judgment: Judgment normal.     Assessment   1. Obesity with weight loss  2. DM 2 A1C with h/o gestational dm with, HLD A1C 6.9 will need labs at f/u  3. Dizziness and muscle twitching intermittently but often with  stressors  4. Allergic rhinitis with mild fluid in b/l ears today  5. Fatty liver  6. Abnormal urine with dysuria  7. HM Plan   1. adipex x 2 more months then break x 3 months f/u in 6 months  2. Refer nutrition  Resume metformin 500 mg er qd but if cant tolerate change therapy  Consider statin in future given cholesterol info  Foot exam at f/u  Pt needs to sch eye exam goes off [employer] 3. Pt wants to hold on neurology referral for now  4. On zyrtec rec use flonase otc  5. Given 1/3 twinrix vaccines today  Info fatty liver  Risk factor control  6. UA and culture today  7.  Had flu shot  Tdap utd check date ncir  Hep A/B vx today 1/3 for fatty liver   Consider shingrix in future  Pap 02/08/18 neg neg hpv  mammo neg 02/12/18 referred [facility] mammogram   colonoscopy had 12/06/14 bx negative check GI when due  Refer to Dr. Kellie Moor skin   Provider: Dr. Olivia Mackie McLean-Scocuzza-Internal Medicine

## 2018-11-05 LAB — URINALYSIS, ROUTINE W REFLEX MICROSCOPIC
Bilirubin Urine: NEGATIVE
Glucose, UA: NEGATIVE
Hgb urine dipstick: NEGATIVE
Ketones, ur: NEGATIVE
Leukocytes, UA: NEGATIVE
Nitrite: NEGATIVE
Protein, ur: NEGATIVE
Specific Gravity, Urine: 1.014 (ref 1.001–1.03)
pH: 7 (ref 5.0–8.0)

## 2018-11-05 LAB — URINE CULTURE
MICRO NUMBER:: 65285
SPECIMEN QUALITY:: ADEQUATE

## 2018-11-08 ENCOUNTER — Other Ambulatory Visit: Payer: Self-pay | Admitting: Internal Medicine

## 2018-11-08 ENCOUNTER — Encounter: Payer: Self-pay | Admitting: Internal Medicine

## 2018-11-08 DIAGNOSIS — E119 Type 2 diabetes mellitus without complications: Secondary | ICD-10-CM

## 2018-11-08 MED ORDER — SITAGLIPTIN PHOSPHATE 50 MG PO TABS: 50.0000 mg | ORAL_TABLET | Freq: Every day | ORAL | 3 refills | Status: DC

## 2018-11-08 NOTE — Progress Notes (Signed)
Patient is not in [facility] 

## 2018-11-08 NOTE — Progress Notes (Signed)
Needs Tdap   [place]

## 2018-11-08 NOTE — Addendum Note (Signed)
Addended by: Elpidio Galea T on: 11/08/2018 10:02 AM   Modules accepted: Orders

## 2018-11-10 ENCOUNTER — Telehealth: Payer: Self-pay

## 2018-11-10 NOTE — Telephone Encounter (Signed)
LMTCO. Also sent my chart message.

## 2018-11-10 NOTE — Telephone Encounter (Signed)
-----   Message from Virgel Manifold, MD sent at 11/05/2018  9:12 AM EST ----- Looks like the colonoscopy report recommends resuming colonoscopy for screening at 53 years of age. So she would be due now.  Jackelyn Poling, can you call her and if she would like to schedule a screening colonoscopy go ahead and schedule her. Thank you.  ----- Message ----- From: McLean-Scocuzza, Nino Glow, MD Sent: 11/04/2018   5:38 PM EST To: Virgel Manifold, MD  Based on last colonoscopy 11/2014, when should she have repeat colonoscopy looks like bxs negative with age, would you wait 10 years ?   [place]

## 2018-11-12 NOTE — Telephone Encounter (Signed)
Sent message via my chart that Dr. Bonna Gains aware of 2016 colonoscopy and that she is due now due to recommendations to have this done.

## 2018-11-17 ENCOUNTER — Encounter: Payer: Self-pay | Admitting: Internal Medicine

## 2018-11-22 ENCOUNTER — Encounter: Payer: Self-pay | Admitting: Internal Medicine

## 2018-11-29 ENCOUNTER — Other Ambulatory Visit: Payer: Self-pay

## 2018-11-29 MED ORDER — OMEPRAZOLE 20 MG PO CPDR: 20.0000 mg | DELAYED_RELEASE_CAPSULE | Freq: Two times a day (BID) | ORAL | 0 refills | Status: DC

## 2018-12-01 NOTE — Telephone Encounter (Signed)
Refer to my chart message. Pt will contact office when she is ready to schedule.

## 2018-12-07 ENCOUNTER — Ambulatory Visit: Payer: BLUE CROSS/BLUE SHIELD

## 2018-12-08 ENCOUNTER — Ambulatory Visit (INDEPENDENT_AMBULATORY_CARE_PROVIDER_SITE_OTHER): Payer: BLUE CROSS/BLUE SHIELD | Admitting: Lab

## 2018-12-08 DIAGNOSIS — Z23 Encounter for immunization: Secondary | ICD-10-CM | POA: Diagnosis not present

## 2018-12-08 NOTE — Progress Notes (Signed)
Pt here today for Hep A/B injection in Left-Deltoid. Pt tolerated well.

## 2018-12-15 ENCOUNTER — Encounter: Payer: Self-pay | Admitting: Internal Medicine

## 2019-01-20 ENCOUNTER — Encounter: Payer: Self-pay | Admitting: Internal Medicine

## 2019-02-01 ENCOUNTER — Other Ambulatory Visit: Payer: Self-pay | Admitting: Gastroenterology

## 2019-02-01 MED ORDER — DICYCLOMINE HCL 10 MG PO CAPS: 10.0000 mg | ORAL_CAPSULE | Freq: Two times a day (BID) | ORAL | 0 refills | Status: DC | PRN

## 2019-02-15 ENCOUNTER — Other Ambulatory Visit: Payer: Self-pay

## 2019-02-15 ENCOUNTER — Other Ambulatory Visit: Payer: Self-pay | Admitting: Internal Medicine

## 2019-02-15 ENCOUNTER — Encounter: Payer: Self-pay | Admitting: Internal Medicine

## 2019-02-15 DIAGNOSIS — I10 Essential (primary) hypertension: Secondary | ICD-10-CM

## 2019-02-15 MED ORDER — HYDROCHLOROTHIAZIDE 12.5 MG PO TABS: 12.5000 mg | ORAL_TABLET | Freq: Every day | ORAL | 1 refills | Status: DC

## 2019-02-15 MED ORDER — LOSARTAN POTASSIUM 50 MG PO TABS: 50.0000 mg | ORAL_TABLET | Freq: Every day | ORAL | 3 refills | Status: DC

## 2019-02-15 MED ORDER — LOSARTAN POTASSIUM 50 MG PO TABS: 50.0000 mg | ORAL_TABLET | Freq: Every day | ORAL | 1 refills | Status: DC

## 2019-02-15 MED ORDER — HYDROCHLOROTHIAZIDE 12.5 MG PO TABS: 12.5000 mg | ORAL_TABLET | Freq: Every day | ORAL | 3 refills | Status: DC

## 2019-03-04 ENCOUNTER — Other Ambulatory Visit: Payer: Self-pay | Admitting: Gastroenterology

## 2019-03-15 ENCOUNTER — Encounter: Payer: Self-pay | Admitting: Internal Medicine

## 2019-04-07 ENCOUNTER — Encounter: Payer: Self-pay | Admitting: Internal Medicine

## 2019-04-19 DIAGNOSIS — Z1231 Encounter for screening mammogram for malignant neoplasm of breast: Secondary | ICD-10-CM | POA: Diagnosis not present

## 2019-05-10 ENCOUNTER — Ambulatory Visit (INDEPENDENT_AMBULATORY_CARE_PROVIDER_SITE_OTHER): Payer: BC Managed Care – PPO | Admitting: Internal Medicine

## 2019-05-10 ENCOUNTER — Other Ambulatory Visit: Payer: Self-pay

## 2019-05-10 ENCOUNTER — Encounter: Payer: Self-pay | Admitting: Internal Medicine

## 2019-05-10 VITALS — BP 118/82 | HR 97

## 2019-05-10 VITALS — BP 118/82

## 2019-05-10 DIAGNOSIS — Z23 Encounter for immunization: Secondary | ICD-10-CM

## 2019-05-10 DIAGNOSIS — R109 Unspecified abdominal pain: Secondary | ICD-10-CM

## 2019-05-10 DIAGNOSIS — I10 Essential (primary) hypertension: Secondary | ICD-10-CM

## 2019-05-10 DIAGNOSIS — E669 Obesity, unspecified: Secondary | ICD-10-CM | POA: Diagnosis not present

## 2019-05-10 DIAGNOSIS — Z0000 Encounter for general adult medical examination without abnormal findings: Secondary | ICD-10-CM | POA: Diagnosis not present

## 2019-05-10 DIAGNOSIS — E119 Type 2 diabetes mellitus without complications: Secondary | ICD-10-CM | POA: Diagnosis not present

## 2019-05-10 MED ORDER — PHENTERMINE HCL 37.5 MG PO TABS: 37.5000 mg | ORAL_TABLET | Freq: Every day | ORAL | 0 refills | Status: DC

## 2019-05-10 NOTE — Progress Notes (Signed)
Virtual Visit via Video Note  I connected with Angela Simon  on 05/10/19 at  8:30 AM EDT by a video enabled telemedicine application and verified that I am speaking with the correct person using two identifiers.  Location patient: car Location provider:work or home office Persons participating in the virtual visit: patient, provider  I discussed the limitations of evaluation and management by telemedicine and the availability of in person appointments. The patient expressed understanding and agreed to proceed.   HPI: 1. DM 2 A1C 6.9 on Januvia 50 mg qd BP today 118/82 she never saw nutrition  2. 3/3 twinrix vaccines given today 3. HTN on losartan 50 mg qd and hctz 12.5 mg qd BP controlled today  4. Obesity wants to resume adipex  5. Abdominal pain intermittently f/u with GI and due colonoscopy  6. Annual   ROS: See pertinent positives and negatives per HPI. Gen: +fatigue  HEENT: no sore throat  CV: no chest pain  Lungs: no sob  GI: +intermittent ab pain  MSK: +joint pain intermittently  Skin: no issues  Neuro: no h/a  GU: no issues  Psych: no issues h/o anxiety/depression controlled   Past Medical History:  Diagnosis Date  . Allergy   . Essential hypertension   . Fatty liver   . GERD (gastroesophageal reflux disease)   . Gestational diabetes   . Hyperlipidemia   . IBS (irritable bowel syndrome)   . Kidney stones    x 3 occurrences   . Melanoma ([place])   . Obesity (BMI 30-39.9)   . Renal disorder     Past Surgical History:  Procedure Laterality Date  . BREAST REDUCTION SURGERY    . CESAREAN SECTION    . LITHOTRIPSY     x1  . MELANOMA EXCISION     Right arm  . NASAL SINUS SURGERY    . TONSILLECTOMY      Family History  Problem Relation Age of Onset  . Heart attack Mother   . Hypertension Mother   . Heart disease Mother        MI  . Diabetes Father   . Prostate cancer Father   . Breast cancer Sister     SOCIAL HX: married    Current Outpatient  Medications:  .  bifidobacterium infantis (ALIGN) capsule, Take by mouth., Disp: , Rfl:  .  cetirizine (ZYRTEC) 10 MG tablet, Take 10 mg by mouth daily., Disp: , Rfl:  .  dicyclomine (BENTYL) 10 MG capsule, Take 1 capsule (10 mg total) by mouth 2 (two) times daily as needed for up to 30 days for spasms., Disp: 60 capsule, Rfl: 0 .  ferrous sulfate 325 (65 FE) MG tablet, Take 325 mg by mouth daily with breakfast., Disp: , Rfl:  .  FLUoxetine (PROZAC) 20 MG tablet, Take 20 mg by mouth daily., Disp: , Rfl:  .  hydrochlorothiazide (HYDRODIURIL) 12.5 MG tablet, Take 1 tablet (12.5 mg total) by mouth daily., Disp: 90 tablet, Rfl: 3 .  losartan (COZAAR) 50 MG tablet, Take 1 tablet (50 mg total) by mouth daily., Disp: 90 tablet, Rfl: 3 .  Multiple Vitamin (MULTIVITAMIN) tablet, Take 1 tablet by mouth daily., Disp: , Rfl:  .  Omega-3 1000 MG CAPS, Take by mouth., Disp: , Rfl:  .  omeprazole (PRILOSEC) 20 MG capsule, TAKE 1 CAPSULE BY MOUTH TWICE DAILY BEFORE A MEAL, Disp: 180 capsule, Rfl: 0 .  phentermine (ADIPEX-P) 37.5 MG tablet, Take 1 tablet (37.5 mg total) by mouth daily  before breakfast., Disp: 60 tablet, Rfl: 0 .  sitaGLIPtin (JANUVIA) 50 MG tablet, Take 1 tablet (50 mg total) by mouth daily., Disp: 90 tablet, Rfl: 3  EXAM:  VITALS per patient if applicable:  GENERAL: alert, oriented, appears well and in no acute distress  HEENT: atraumatic, conjunttiva clear, no obvious abnormalities on inspection of external nose and ears  NECK: normal movements of the head and neck  LUNGS: on inspection no signs of respiratory distress, breathing rate appears normal, no obvious gross SOB, gasping or wheezing  CV: no obvious cyanosis  MS: moves all visible extremities without noticeable abnormality  PSYCH/NEURO: pleasant and cooperative, no obvious depression or anxiety, speech and thought processing grossly intact  ASSESSMENT AND PLAN:  Discussed the following assessment and plan:  Annual  Had  flu shot  Tdap had check date had per pt Hep A/B vx today 3/3 for fatty liver 3rd twinrix today 05/10/19  Consider shingrix in future  Pap 02/08/18 neg neg hpv  mammo neg [facility]ogram 04/19/2019  colonoscopy had 12/06/14 bx negative check GI when due will f/u with Dr. Earley Favor GI  Refer to Dr. Kellie Moor skin rec healthy diet and exercise    Type 2 diabetes mellitus without complication, without long-term current use of insulin ([place]) - Plan: Comprehensive metabolic panel, Lipid panel, Hemoglobin A1c, Microalbumin / creatinine urine ratio On januvia 50 mg qd  rec the Next 56 days healthy diet and exercise -sch fasting labs   Obesity (BMI 30-39.9) - Plan: phentermine (ADIPEX-P) 37.5 MG tablet, DISCONTINUED: phentermine (ADIPEX-P) 37.5 MG tablet x 4 month supply   Abdominal pain, unspecified abdominal location - Plan: Lipase if elevated consider further imaging I.e repeat US abdomen  Essential hypertension - Plan: BP 118/82 today on meds cont meds monitor BP at home     I discussed the assessment and treatment plan with the patient. The patient was provided an opportunity to ask questions and all were answered. The patient agreed with the plan and demonstrated an understanding of the instructions.   The patient was advised to call back or seek an in-person evaluation if the symptoms worsen or if the condition fails to improve as anticipated.  Time spent 15 minutes  Delorise Jackson, MD

## 2019-05-10 NOTE — Patient Instructions (Signed)
The Next 56 days online nutrition app Dermatology Dr. Kellie Moor call [phone]   Hepatitis A; Hepatitis B Vaccine injection What is this medicine? HEPATITIS A VACCINE; HEPATITIS B VACCINE (hep uh TAHY tis A vak SEEN; hep uh TAHY tis B vak SEEN) is a vaccine to protect from an infection with the hepatitis A and B virus. This vaccine does not contain the live viruses. It will not cause a hepatitis infection. This medicine may be used for other purposes; ask your health care provider or pharmacist if you have questions. COMMON BRAND NAME(S): Twinrix What should I tell my health care provider before I take this medicine? They need to know if you have any of these conditions:  bleeding disorder  fever or infection  heart disease  immune system problems  an unusual or allergic reaction to hepatitis A or B vaccine, neomycin, yeast, thimerosal, other medicines, foods, dyes, or preservatives  pregnant or trying to get pregnant  breast-feeding How should I use this medicine? This vaccine is for injection into a muscle. It is given by a health care professional. A copy of Vaccine Information Statements will be given before each vaccination. Read this sheet carefully each time. The sheet may change frequently. Talk to your pediatrician regarding the use of this medicine in children. Special care may be needed. Overdosage: If you think you have taken too much of this medicine contact a poison control center or emergency room at once. NOTE: This medicine is only for you. Do not share this medicine with others. What if I miss a dose? It is important not to miss your dose. Call your doctor or health care professional if you are unable to keep an appointment. What may interact with this medicine?  medicines that suppress your immune function like adalimumab, anakinra, infliximab  medicines to treat cancer  steroid medicines like prednisone or cortisone This list may not describe all possible  interactions. Give your health care provider a list of all the medicines, herbs, non-prescription drugs, or dietary supplements you use. Also tell them if you smoke, drink alcohol, or use illegal drugs. Some items may interact with your medicine. What should I watch for while using this medicine? See your health care provider for all shots of this vaccine as directed. You must have 3 to 4 shots of this vaccine for protection from hepatitis A and B infection. Tell your doctor right away if you have any serious or unusual side effects after getting this vaccine. What side effects may I notice from receiving this medicine? Side effects that you should report to your doctor or health care professional as soon as possible:  allergic reactions like skin rash, itching or hives, swelling of the face, lips, or tongue  breathing problems  confused, irritated  fast, irregular heartbeat  flu-like syndrome  numb, tingling pain  seizures Side effects that usually do not require medical attention (report to your doctor or health care professional if they continue or are bothersome):  diarrhea  fever  headache  loss of appetite  muscle pain  nausea  pain, redness, swelling, or irritation at site where injected  tiredness This list may not describe all possible side effects. Call your doctor for medical advice about side effects. You may report side effects to FDA at 1-800-FDA-1088. Where should I keep my medicine? This drug is given in a hospital or clinic and will not be stored at home. NOTE: This sheet is a summary. It may not cover all  possible information. If you have questions about this medicine, talk to your doctor, pharmacist, or health care provider.  2020 Elsevier/Gold Standard (2008-02-18 15:21:37)

## 2019-05-13 ENCOUNTER — Encounter: Payer: Self-pay | Admitting: Internal Medicine

## 2019-05-20 ENCOUNTER — Other Ambulatory Visit (INDEPENDENT_AMBULATORY_CARE_PROVIDER_SITE_OTHER): Payer: BC Managed Care – PPO

## 2019-05-20 ENCOUNTER — Other Ambulatory Visit: Payer: Self-pay

## 2019-05-20 DIAGNOSIS — R109 Unspecified abdominal pain: Secondary | ICD-10-CM

## 2019-05-20 DIAGNOSIS — E119 Type 2 diabetes mellitus without complications: Secondary | ICD-10-CM | POA: Diagnosis not present

## 2019-05-20 LAB — COMPREHENSIVE METABOLIC PANEL
ALT: 44 U/L — ABNORMAL HIGH (ref 0–35)
AST: 29 U/L (ref 0–37)
Albumin: 4.4 g/dL (ref 3.5–5.2)
Alkaline Phosphatase: 48 U/L (ref 39–117)
BUN: 13 mg/dL (ref 6–23)
CO2: 31 mEq/L (ref 19–32)
Calcium: 10.2 mg/dL (ref 8.4–10.5)
Chloride: 98 mEq/L (ref 96–112)
Creatinine, Ser: 0.86 mg/dL (ref 0.40–1.20)
GFR: 69.65 mL/min (ref >60.00)
Glucose, Bld: 136 mg/dL — ABNORMAL HIGH (ref 70–99)
Potassium: 3.9 mEq/L (ref 3.5–5.1)
Sodium: 138 mEq/L (ref 135–145)
Total Bilirubin: 0.4 mg/dL (ref 0.2–1.2)
Total Protein: 6.8 g/dL (ref 6.0–8.3)

## 2019-05-20 LAB — LIPASE: Lipase: 21 U/L (ref 11.0–59.0)

## 2019-05-20 LAB — LIPID PANEL
Cholesterol: 200 mg/dL (ref 0–200)
HDL: 44.9 mg/dL (ref >39.00)
LDL Cholesterol: 119 mg/dL — ABNORMAL HIGH (ref 0–99)
NonHDL: 154.72
Total CHOL/HDL Ratio: 4
Triglycerides: 177 mg/dL — ABNORMAL HIGH (ref 0.0–149.0)
VLDL: 35.4 mg/dL (ref 0.0–40.0)

## 2019-05-20 LAB — MICROALBUMIN / CREATININE URINE RATIO
Creatinine,U: 237.3 mg/dL
Microalb Creat Ratio: 0.9 mg/g (ref 0.0–30.0)
Microalb, Ur: 2.1 mg/dL — ABNORMAL HIGH (ref 0.0–1.9)

## 2019-05-20 LAB — HEMOGLOBIN A1C: Hgb A1c MFr Bld: 7 % — ABNORMAL HIGH (ref 4.6–6.5)

## 2019-05-26 ENCOUNTER — Other Ambulatory Visit: Payer: Self-pay | Admitting: Internal Medicine

## 2019-05-26 ENCOUNTER — Encounter: Payer: Self-pay | Admitting: Internal Medicine

## 2019-05-26 DIAGNOSIS — E119 Type 2 diabetes mellitus without complications: Secondary | ICD-10-CM

## 2019-05-26 MED ORDER — SITAGLIPTIN PHOSPHATE 100 MG PO TABS: 100.0000 mg | ORAL_TABLET | Freq: Every day | ORAL | 3 refills | Status: DC

## 2019-06-01 ENCOUNTER — Other Ambulatory Visit: Payer: Self-pay | Admitting: Internal Medicine

## 2019-06-01 ENCOUNTER — Encounter: Payer: Self-pay | Admitting: Internal Medicine

## 2019-06-01 DIAGNOSIS — I10 Essential (primary) hypertension: Secondary | ICD-10-CM

## 2019-06-01 MED ORDER — LOSARTAN POTASSIUM-HCTZ 50-12.5 MG PO TABS: 1.0000 | ORAL_TABLET | Freq: Every day | ORAL | 3 refills | Status: DC

## 2019-07-05 MED ORDER — OMEPRAZOLE 20 MG PO CPDR: DELAYED_RELEASE_CAPSULE | ORAL | 0 refills | Status: DC

## 2019-07-20 ENCOUNTER — Encounter: Payer: Self-pay | Admitting: Gastroenterology

## 2019-07-20 ENCOUNTER — Ambulatory Visit (INDEPENDENT_AMBULATORY_CARE_PROVIDER_SITE_OTHER): Payer: BC Managed Care – PPO | Admitting: Gastroenterology

## 2019-07-20 ENCOUNTER — Other Ambulatory Visit: Payer: Self-pay

## 2019-07-20 VITALS — BP 132/83 | HR 94 | Temp 98.3°F | Ht 60.0 in | Wt 177.4 lb

## 2019-07-20 DIAGNOSIS — R140 Abdominal distension (gaseous): Secondary | ICD-10-CM

## 2019-07-20 DIAGNOSIS — Z791 Long term (current) use of non-steroidal anti-inflammatories (NSAID): Secondary | ICD-10-CM | POA: Diagnosis not present

## 2019-07-20 DIAGNOSIS — R1013 Epigastric pain: Secondary | ICD-10-CM

## 2019-07-20 DIAGNOSIS — K219 Gastro-esophageal reflux disease without esophagitis: Secondary | ICD-10-CM | POA: Diagnosis not present

## 2019-07-20 NOTE — Progress Notes (Signed)
Jonathon Bellows MD, MRCP(U.K) [address]  [place]  [place], [place] [address]  Main: [phone]  Fax: [phone]   Primary Care Physician: McLean-Scocuzza, Nino Glow, MD  Primary Gastroenterologist:  Dr. Jonathon Bellows   Urgent visit for abdominal pain.  HPI: Angela Simon is a 53 y.o. female who is here to see me today for an urgent visit for abdominal pain.  She has initially1 seen by Dr. Bonna Gains in October 2019 for dyspepsia.  As per last note patient had a globus sensation, heartburn.  Possible IBS in the past evaluated by [facility], [facility].  Has been treated in the past for SEBO with 2-week course of Xifaxan.  She has also been seen by [facility] in the past.  Prior evaluation also includes gastric emptying and hydrogen breath testing.  Apparently were normal.  Normal colonoscopy in 2016 February.  No recent imaging of the abdomen.  05/09/2019: Minimally elevated ALT of 44 elevated blood glucose level of 136.  No recent CBC.  She says that she has had the same problem for many years which is abdominal distention, bloating, discomfort occurring on and off.  The episode she had yesterday was severe and hence decided to come and see me today.  Today she feels much better.  She says that it usually begins a while after she eats she feels full if he feels distended, not relieved by having a bowel movement.  She does have a bowel movement daily but sometimes not satisfactory.  She has been taking 4 ibuprofens per day for many years for generalized body aches.  Has been taking Prilosec once a day.  Not a smoker.  She does suffer from stress and anxiety.  She is diabetic and consumes a lot of artificial sugars in the form of 4/is of sweet and low daily.  About half a gallon of sweet tea daily.  In addition she likes anything that is sweet    Current Outpatient Medications  Medication Sig Dispense Refill  . bifidobacterium infantis (ALIGN) capsule Take by mouth.    . cetirizine  (ZYRTEC) 10 MG tablet Take 10 mg by mouth daily.    Marland Kitchen dicyclomine (BENTYL) 10 MG capsule Take 1 capsule (10 mg total) by mouth 2 (two) times daily as needed for up to 30 days for spasms. 60 capsule 0  . ferrous sulfate 325 (65 FE) MG tablet Take 325 mg by mouth daily with breakfast.    . FLUoxetine (PROZAC) 20 MG tablet Take 20 mg by mouth daily.    Marland Kitchen losartan-hydrochlorothiazide (HYZAAR) 50-12.5 MG tablet Take 1 tablet by mouth daily. In am 90 tablet 3  . Multiple Vitamin (MULTIVITAMIN) tablet Take 1 tablet by mouth daily.    . Omega-3 1000 MG CAPS Take by mouth.    Marland Kitchen omeprazole (PRILOSEC) 20 MG capsule TAKE 1 CAPSULE BY MOUTH TWICE DAILY BEFORE A MEAL 180 capsule 0  . phentermine (ADIPEX-P) 37.5 MG tablet Take 1 tablet (37.5 mg total) by mouth daily before breakfast. 60 tablet 0  . sitaGLIPtin (JANUVIA) 100 MG tablet Take 1 tablet (100 mg total) by mouth daily. 90 tablet 3   No current facility-administered medications for this visit.     Allergies as of 07/20/2019 - Review Complete 05/10/2019  Allergen Reaction Noted  . Metformin and related  11/08/2018    ROS:  General: Negative for anorexia, weight loss, fever, chills, fatigue, weakness. ENT: Negative for hoarseness, difficulty swallowing , nasal congestion. CV: Negative  for chest pain, angina, palpitations, dyspnea on exertion, peripheral edema.  Respiratory: Negative for dyspnea at rest, dyspnea on exertion, cough, sputum, wheezing.  GI: See history of present illness. GU:  Negative for dysuria, hematuria, urinary incontinence, urinary frequency, nocturnal urination.  Endo: Negative for unusual weight change.    Physical Examination:   There were no vitals taken for this visit.  General: Well-nourished, well-developed in no acute distress.  Eyes: No icterus. Conjunctivae pink. Mouth: Oropharyngeal mucosa moist and pink , no lesions erythema or exudate. Lungs: Clear to auscultation bilaterally. Non-labored. Heart: Regular  rate and rhythm, no murmurs rubs or gallops.  Abdomen: Bowel sounds are normal, nontender, nondistended, no hepatosplenomegaly or masses, no abdominal bruits or hernia , no rebound or guarding.   Extremities: No lower extremity edema. No clubbing or deformities. Neuro: Alert and oriented x 3.  Grossly intact. Skin: Warm and dry, no jaundice.   Psych: Alert and cooperative, normal mood and affect.   Imaging Studies: No results found.  Assessment and Plan:   Angela Simon is a 53 y.o. y/o female here to see me as an urgent visit for abdominal discomfort.  Ongoing for the past few years.  Was particularly severe last night but significantly better this morning.  Abdominal exam is benign.  Her history is consistent with small bowel bacterial overgrowth syndrome.  It may also have coexisting carbohydrate intolerance as her diet is very high in natural and artificial sugars.  She also has been on long-term NSAIDs which may have caused mucosal injury including an ulcer.  Plan 1.  Stop NSAIDs 2.  Continue Prilosec 3.  H. pylori breath test 4.  Stop all sweet drinks and sodas including dose with artificial sugars.  Stop sweet and low. 5.  Maintain food diary to discuss at follow-up visit 6.  Low FODMAP diet 7.  Use activated charcoal tablets as needed. 8.  MiraLAX as needed to have at least 1 or 2 soft bowel movements a day. 9.  If no better at next visit we will consider imaging, endoscopy. 10.  Check CBC and TSH   Dr Jonathon Bellows  MD,MRCP [facility]) Follow up in 1 week either telephone or video visit.

## 2019-07-21 ENCOUNTER — Encounter: Payer: Self-pay | Admitting: Gastroenterology

## 2019-07-21 LAB — CBC WITH DIFFERENTIAL/PLATELET
Basophils Absolute: 0 10*3/uL (ref 0.0–0.2)
Basos: 0 %
EOS (ABSOLUTE): 0.1 10*3/uL (ref 0.0–0.4)
Eos: 3 %
Hematocrit: 38.3 % (ref 34.0–46.6)
Hemoglobin: 13 g/dL (ref 11.1–15.9)
Immature Grans (Abs): 0.1 10*3/uL (ref 0.0–0.1)
Immature Granulocytes: 1 %
Lymphocytes Absolute: 1.2 10*3/uL (ref 0.7–3.1)
Lymphs: 23 %
MCH: 30.5 pg (ref 26.6–33.0)
MCHC: 33.9 g/dL (ref 31.5–35.7)
MCV: 90 fL (ref 79–97)
Monocytes Absolute: 0.2 10*3/uL (ref 0.1–0.9)
Monocytes: 4 %
Neutrophils Absolute: 3.6 10*3/uL (ref 1.4–7.0)
Neutrophils: 69 %
Platelets: 369 10*3/uL (ref 150–450)
RBC: 4.26 x10E6/uL (ref 3.77–5.28)
RDW: 12.9 % (ref 11.7–15.4)
WBC: 5.2 10*3/uL (ref 3.4–10.8)

## 2019-07-21 LAB — H. PYLORI BREATH TEST: H pylori Breath Test: NEGATIVE

## 2019-07-21 LAB — TSH: TSH: 2.63 u[IU]/mL (ref 0.450–4.500)

## 2019-07-27 ENCOUNTER — Ambulatory Visit: Payer: BC Managed Care – PPO | Admitting: Gastroenterology

## 2019-07-27 NOTE — Progress Notes (Deleted)
Angela Simon , MD [address]  [place]  [name], [place] [address]  Main: [phone]  Fax: [phone]   Primary Care Physician: McLean-Scocuzza, Nino Glow, MD  Virtual Visit via Telephone Note  I connected with patient on 07/27/19 at 10:00 AM EDT by telephone and verified that I am speaking with the correct person using two identifiers.   I discussed the limitations, risks, security and privacy concerns of performing an evaluation and management service by telephone and the availability of in person appointments. I also discussed with the patient that there may be a patient responsible charge related to this service. The patient expressed understanding and agreed to proceed.  Location of Patient: Home Location of Provider: Home Persons involved: Patient and provider only   History of Present Illness: Follow-up for abdominal pain  HPI: Angela Simon is a 53 y.o. female    Summary of history :  She was seen by myself on 07/20/2019 as an urgent visit for abdominal pain.  Seen by Dr. Evaristo Bury previously in October 2019 for dyspepsia.  She has been previously evaluated [facility], [facility], [facility] for globus sensation, heartburn, IBS.  Has been treated for bacterial overgrowth syndrome in the past with 2 weeks of Xifaxan. Prior evaluation has included gastric emptying study and hydrogen breath testing which were apparently normal.  Normal colonoscopy in February 2016.  05/09/2019: Minimally elevated ALT of 44 elevated blood glucose level of 136.  No recent CBC.  At her initial visit on 07/20/2019 she came in with the same complaints of abdominal distention, bloating, discomfort going on and off for many years.  The day prior to her presentation the pain was severe and hence decided to come and see me but on the day of the office visit she felt much better.  The discomfort usually begins after she eats and feels distended not relieved by bowel movement.  She does have  a bowel movement daily but often not satisfactory.  She had been taking 4 ibuprofens per day for many years for generalized body aches.  Has also been taking Prilosec once a day.  Does suffer from stress and anxiety and consumes a lot of artificial sugars in the form of iced tea and sweet and low daily.  She consumes about half a gallon of sweet tea daily  My impression was that she may have been suffering from small bowel bacterial overgrowth syndrome and have coexisting carbohydrate intolerance or damage from long-term NSAID use.  Interval history 07/20/2019-07/27/2019  07/20/2019: H. pylori breath test: Negative, TSH: Normal, CBC: Normal  Current Outpatient Medications  Medication Sig Dispense Refill  . bifidobacterium infantis (ALIGN) capsule Take by mouth.    . cetirizine (ZYRTEC) 10 MG tablet Take 10 mg by mouth daily.    Marland Kitchen dicyclomine (BENTYL) 10 MG capsule Take 1 capsule (10 mg total) by mouth 2 (two) times daily as needed for up to 30 days for spasms. 60 capsule 0  . ferrous sulfate 325 (65 FE) MG tablet Take 325 mg by mouth daily with breakfast.    . FLUoxetine (PROZAC) 20 MG tablet Take 20 mg by mouth daily.    Marland Kitchen losartan-hydrochlorothiazide (HYZAAR) 50-12.5 MG tablet Take 1 tablet by mouth daily. In am 90 tablet 3  . Multiple Vitamin (MULTIVITAMIN) tablet Take 1 tablet by mouth daily.    . Omega-3 1000 MG CAPS Take by mouth.    Marland Kitchen omeprazole (PRILOSEC) 20 MG capsule TAKE 1 CAPSULE BY  MOUTH TWICE DAILY BEFORE A MEAL 180 capsule 0  . phentermine (ADIPEX-P) 37.5 MG tablet Take 1 tablet (37.5 mg total) by mouth daily before breakfast. (Patient not taking: Reported on 07/20/2019) 60 tablet 0  . sitaGLIPtin (JANUVIA) 100 MG tablet Take 1 tablet (100 mg total) by mouth daily. 90 tablet 3   No current facility-administered medications for this visit.     Allergies as of 07/27/2019 - Review Complete 07/20/2019  Allergen Reaction Noted  . Metformin and related  11/08/2018    Review of  Systems:    All systems reviewed and negative except where noted in HPI.   Observations/Objective:  Labs: CMP     Component Value Date/Time   NA 138 05/20/2019 0942   K 3.9 05/20/2019 0942   CL 98 05/20/2019 0942   CO2 31 05/20/2019 0942   GLUCOSE 136 (H) 05/20/2019 0942   BUN 13 05/20/2019 0942   CREATININE 0.86 05/20/2019 0942   CALCIUM 10.2 05/20/2019 0942   PROT 6.8 05/20/2019 0942   ALBUMIN 4.4 05/20/2019 0942   AST 29 05/20/2019 0942   ALT 44 (H) 05/20/2019 0942   ALKPHOS 48 05/20/2019 0942   BILITOT 0.4 05/20/2019 0942   Lab Results  Component Value Date   WBC 5.2 07/20/2019   HGB 13.0 07/20/2019   HCT 38.3 07/20/2019   MCV 90 07/20/2019   PLT 369 07/20/2019    Imaging Studies: No results found.  Assessment and Plan:   Angela Simon is a 53 y.o. y/o female here to see me as an urgent visit for abdominal discomfort.  Ongoing for the past few years.  Was particularly severe last night but significantly better this morning.  Abdominal exam is benign.  Her history is consistent with small bowel bacterial overgrowth syndrome.  It may also have coexisting carbohydrate intolerance as her diet is very high in natural and artificial sugars.  She also has been on long-term NSAIDs which may have caused mucosal injury including an ulcer.  Plan 1.  Stop NSAIDs 2.  Continue Prilosec 3.  H. pylori breath test 4.  Stop all sweet drinks and sodas including dose with artificial sugars.  Stop sweet and low. 5.  Maintain food diary to discuss at follow-up visit 6.  Low FODMAP diet 7.  Use activated charcoal tablets as needed. 8.  MiraLAX as needed to have at least 1 or 2 soft bowel movements a day. 9.  If no better at next visit we will consider imaging, endoscopy.  I discussed the assessment and treatment plan with the patient. The patient was provided an opportunity to ask questions and all were answered. The patient agreed with the plan and demonstrated an  understanding of the instructions.   The patient was advised to call back or seek an in-person evaluation if the symptoms worsen or if the condition fails to improve as anticipated.  I provided *** minutes of non-face-to-face time during this encounter.  Dr Angela Bellows MD,MRCP [facility]) Gastroenterology/Hepatology Pager: [phone]   Speech recognition software was used to dictate this note.

## 2019-08-02 ENCOUNTER — Other Ambulatory Visit: Payer: Self-pay

## 2019-08-02 DIAGNOSIS — R1013 Epigastric pain: Secondary | ICD-10-CM

## 2019-08-02 DIAGNOSIS — K219 Gastro-esophageal reflux disease without esophagitis: Secondary | ICD-10-CM

## 2019-08-05 ENCOUNTER — Other Ambulatory Visit: Admission: RE | Admit: 2019-08-05 | Payer: BC Managed Care – PPO | Source: Ambulatory Visit

## 2019-08-09 ENCOUNTER — Ambulatory Visit
Admission: RE | Admit: 2019-08-09 | Payer: BC Managed Care – PPO | Source: Home / Self Care | Admitting: Gastroenterology

## 2019-08-09 ENCOUNTER — Encounter: Admission: RE | Payer: Self-pay | Source: Home / Self Care

## 2019-08-09 SURGERY — ESOPHAGOGASTRODUODENOSCOPY (EGD) WITH PROPOFOL
Anesthesia: General

## 2019-08-16 ENCOUNTER — Ambulatory Visit: Payer: BC Managed Care – PPO | Admitting: Internal Medicine

## 2019-08-17 DIAGNOSIS — Z62820 Parent-biological child conflict: Secondary | ICD-10-CM | POA: Diagnosis not present

## 2019-10-03 ENCOUNTER — Encounter: Payer: Self-pay | Admitting: Internal Medicine

## 2019-10-05 ENCOUNTER — Telehealth: Payer: Self-pay | Admitting: *Deleted

## 2019-10-05 NOTE — Telephone Encounter (Signed)
Please cal if patient still having symptoms needs to be scheduled see [facility] My chart message.

## 2019-10-07 NOTE — Telephone Encounter (Signed)
Sent mychart

## 2019-10-10 ENCOUNTER — Other Ambulatory Visit: Payer: Self-pay | Admitting: Internal Medicine

## 2019-10-10 DIAGNOSIS — R300 Dysuria: Secondary | ICD-10-CM

## 2019-10-10 MED ORDER — OMEPRAZOLE 20 MG PO CPDR: DELAYED_RELEASE_CAPSULE | ORAL | 0 refills | Status: DC

## 2019-10-10 NOTE — Telephone Encounter (Signed)
Last refill 07/05/2019  Last office visit 07/20/2019 Dyspepsia

## 2019-10-27 ENCOUNTER — Encounter: Payer: Self-pay | Admitting: Internal Medicine

## 2019-10-27 ENCOUNTER — Ambulatory Visit (INDEPENDENT_AMBULATORY_CARE_PROVIDER_SITE_OTHER): Payer: BC Managed Care – PPO | Admitting: Internal Medicine

## 2019-10-27 ENCOUNTER — Other Ambulatory Visit: Payer: Self-pay

## 2019-10-27 VITALS — Ht 60.0 in | Wt 169.0 lb

## 2019-10-27 DIAGNOSIS — Z1159 Encounter for screening for other viral diseases: Secondary | ICD-10-CM

## 2019-10-27 DIAGNOSIS — R300 Dysuria: Secondary | ICD-10-CM

## 2019-10-27 DIAGNOSIS — R202 Paresthesia of skin: Secondary | ICD-10-CM

## 2019-10-27 DIAGNOSIS — E119 Type 2 diabetes mellitus without complications: Secondary | ICD-10-CM | POA: Diagnosis not present

## 2019-10-27 DIAGNOSIS — M7582 Other shoulder lesions, left shoulder: Secondary | ICD-10-CM

## 2019-10-27 DIAGNOSIS — R748 Abnormal levels of other serum enzymes: Secondary | ICD-10-CM

## 2019-10-27 DIAGNOSIS — M778 Other enthesopathies, not elsewhere classified: Secondary | ICD-10-CM | POA: Diagnosis not present

## 2019-10-27 DIAGNOSIS — R200 Anesthesia of skin: Secondary | ICD-10-CM | POA: Diagnosis not present

## 2019-10-27 DIAGNOSIS — K219 Gastro-esophageal reflux disease without esophagitis: Secondary | ICD-10-CM

## 2019-10-27 DIAGNOSIS — E669 Obesity, unspecified: Secondary | ICD-10-CM

## 2019-10-27 DIAGNOSIS — Z0184 Encounter for antibody response examination: Secondary | ICD-10-CM

## 2019-10-27 DIAGNOSIS — I10 Essential (primary) hypertension: Secondary | ICD-10-CM

## 2019-10-27 NOTE — Progress Notes (Addendum)
Virtual Visit via Video Note  I connected with Angela Simon  on 10/27/19 at  3:45 PM EST by a video enabled telemedicine application and verified that I am speaking with the correct person using two identifiers.  Location patient: home Location provider:work or home office Persons participating in the virtual visit: patient, provider  I discussed the limitations of evaluation and management by telemedicine and the availability of in person appointments. The patient expressed understanding and agreed to proceed.   HPI: 1. DM 2 last A1C 7.0 on januvia 100 mg will repeat labs  2. HTN on hyzaar 50-12.5 mg qd did not check BP today will mychart BP reading  3. Obesity-she is doing intermittent fasting from 7 pm to 9 am daily  4. H/o kidney stones and c/o burning with urination noted since 10/07/19 my chart message burning with stream and after and c/w kidney stones, denies blood in urine She has left kidney area pain mild intermittenly Nothing tried.   She is reports she has left shoulder tendonitis and not sure if sensation was coming from that 5. Abdominal pain w/u with Dr. Bailey Mech negative H pylori and TSH normal and cbc normal she needs EGD but date TBD She takes prilosec 20 mg bid and still has GERD symptoms  6. C/o numbness upper arms and legs chronic and reduced grip in upper arms and holding the phone for this visit she is numb. Legs throb and tingle at times she reports she broke a fingernail recently and did not feel this due to numbness  She tried Lavender oil on her wrist and ankles which helped    ROS: See pertinent positives and negatives per HPI.  Past Medical History:  Diagnosis Date  . Allergy   . Essential hypertension   . Fatty liver   . GERD (gastroesophageal reflux disease)   . Gestational diabetes   . Hyperlipidemia   . IBS (irritable bowel syndrome)   . Kidney stones    x 3 occurrences   . Melanoma ([place])   . Obesity (BMI 30-39.9)   . Renal disorder     Past  Surgical History:  Procedure Laterality Date  . BREAST REDUCTION SURGERY    . CESAREAN SECTION    . LITHOTRIPSY     x1  . MELANOMA EXCISION     Right arm  . NASAL SINUS SURGERY    . TONSILLECTOMY      Family History  Problem Relation Age of Onset  . Heart attack Mother   . Hypertension Mother   . Heart disease Mother        MI  . Diabetes Father   . Prostate cancer Father   . Breast cancer Sister     SOCIAL HX:  Married since 87 y.o  2 children. No grandchildren. Self employed. Enjoys shopping, decorating.  Current Outpatient Medications:  .  bifidobacterium infantis (ALIGN) capsule, Take by mouth., Disp: , Rfl:  .  cetirizine (ZYRTEC) 10 MG tablet, Take 10 mg by mouth daily., Disp: , Rfl:  .  ferrous sulfate 325 (65 FE) MG tablet, Take 325 mg by mouth daily with breakfast., Disp: , Rfl:  .  FLUoxetine (PROZAC) 20 MG tablet, Take 20 mg by mouth daily., Disp: , Rfl:  .  losartan-hydrochlorothiazide (HYZAAR) 50-12.5 MG tablet, Take 1 tablet by mouth daily. In am, Disp: 90 tablet, Rfl: 3 .  Multiple Vitamin (MULTIVITAMIN) tablet, Take 1 tablet by mouth daily., Disp: , Rfl:  .  Omega-3 1000 MG CAPS,  Take by mouth., Disp: , Rfl:  .  omeprazole (PRILOSEC) 20 MG capsule, TAKE 1 CAPSULE BY MOUTH TWICE DAILY BEFORE A MEAL, Disp: 180 capsule, Rfl: 0 .  phentermine (ADIPEX-P) 37.5 MG tablet, Take 1 tablet (37.5 mg total) by mouth daily before breakfast., Disp: 60 tablet, Rfl: 0 .  sitaGLIPtin (JANUVIA) 100 MG tablet, Take 1 tablet (100 mg total) by mouth daily., Disp: 90 tablet, Rfl: 3 .  dicyclomine (BENTYL) 10 MG capsule, Take 1 capsule (10 mg total) by mouth 2 (two) times daily as needed for up to 30 days for spasms., Disp: 60 capsule, Rfl: 0  EXAM:  VITALS per patient if applicable:  GENERAL: alert, oriented, appears well and in no acute distress  HEENT: atraumatic, conjunttiva clear, no obvious abnormalities on inspection of external nose and ears  NECK: normal movements  of the head and neck  LUNGS: on inspection no signs of respiratory distress, breathing rate appears normal, no obvious gross SOB, gasping or wheezing  CV: no obvious cyanosis  MS: moves all visible extremities without noticeable abnormality  PSYCH/NEURO: pleasant and cooperative, no obvious depression or anxiety, speech and thought processing grossly intact  ASSESSMENT AND PLAN:  Discussed the following assessment and plan:  Type 2 diabetes mellitus without complication, without long-term current use of insulin (HCC) - Plan: Lipid panel, HgB A1c, Comprehensive metabolic panel Cont januvia 100 mg qd  Pt trying to lose weight by intermittent fasting  Need to do foot exam at f/u  Ask about eye exam in future  Consider pneumonia 23 vaccine in future   Dysuria with h/o kidney stones ?etiology r/o infection vs stones- Plan: Urinalysis, Routine w reflex microscopic, Urine Culture Consider KUB in future to r/o kidney stones if sx's continue   Numbness and tingling in both hands ddx neuropathy, CTS upper ext needs further work up- Plan: Ambulatory referral to Neurology Numbness and tingling of both legs - Plan: Ambulatory referral to Neurology For EMG/NCS  Essential hypertension Cont meds  Pt to my chart back BP readings and monitor 3x per week  Gastroesophageal reflux disease, unspecified whether esophagitis present with h/o abdominal pain  -pending EGD tbd date   HM Had flu shot2020 Tdap had check date had per pt Hep A/B vx today 3/3 for fatty liver3rd twinrix today 05/10/19 -check hep B titer  consider pna 23 vaccine in the future   Consider shingrix in future  Pap 02/08/18 neg neg hpv  mammo neg [facility] mammogram6/30/2020 negative  colonoscopy had 2/17/16bx negativecheck GI when due will f/u with Dr. Earley [facility]  Referred to Dr. Kellie Moor skinpt has not had derm appt as of 10/27/19 has # to call and schedule will do this rec healthy diet and exercise  [facility] [place] [phone] GERD  IBS, dysphagia/GERD/constipation  Plan: EGD prilosec 20 mg bid f/u in 8 weeks Dr. Esaw Dace 11/11/19 [facility] [place] EGD 11/29/19 erythema in duodenum 2nd part, erythema antrum hiatal hernia bxs taken neg, neg H pylori   BP was 131/82    -we discussed possible serious and likely etiologies, options for evaluation and workup, limitations of telemedicine visit vs in person visit, treatment, treatment risks and precautions. Pt prefers to treat via telemedicine empirically rather then risking or undertaking an in person visit at this moment. Patient agrees to seek prompt in person care if worsening, new symptoms arise, or if is not improving with treatment.   I discussed  the assessment and treatment plan with the patient. The patient was provided an opportunity to ask questions and all were answered. The patient agreed with the plan and demonstrated an understanding of the instructions.   The patient was advised to call back or seek an in-person evaluation if the symptoms worsen or if the condition fails to improve as anticipated.  Time spent 30-39 minutes Delorise Jackson, MD

## 2019-10-27 NOTE — Patient Instructions (Addendum)
Gastroesophageal Reflux Disease, Adult Gastroesophageal reflux (GER) happens when acid from the stomach flows up into the tube that connects the mouth and the stomach (esophagus). Normally, food travels down the esophagus and stays in the stomach to be digested. However, when a person has GER, food and stomach acid sometimes move back up into the esophagus. If this becomes a more serious problem, the person may be diagnosed with a disease called gastroesophageal reflux disease (GERD). GERD occurs when the reflux:  Happens often.  Causes frequent or severe symptoms.  Causes problems such as damage to the esophagus. When stomach acid comes in contact with the esophagus, the acid may cause soreness (inflammation) in the esophagus. Over time, GERD may create small holes (ulcers) in the lining of the esophagus. What are the causes? This condition is caused by a problem with the muscle between the esophagus and the stomach (lower esophageal sphincter, or LES). Normally, the LES muscle closes after food passes through the esophagus to the stomach. When the LES is weakened or abnormal, it does not close properly, and that allows food and stomach acid to go back up into the esophagus. The LES can be weakened by certain dietary substances, medicines, and medical conditions, including:  Tobacco use.  Pregnancy.  Having a hiatal hernia.  Alcohol use.  Certain foods and beverages, such as coffee, chocolate, onions, and peppermint. What increases the risk? You are more likely to develop this condition if you:  Have an increased body weight.  Have a connective tissue disorder.  Use NSAID medicines. What are the signs or symptoms? Symptoms of this condition include:  Heartburn.  Difficult or painful swallowing.  The feeling of having a lump in the throat.  Abitter taste in the mouth.  Bad breath.  Having a large amount of saliva.  Having an upset or bloated  stomach.  Belching.  Chest pain. Different conditions can cause chest pain. Make sure you see your health care provider if you experience chest pain.  Shortness of breath or wheezing.  Ongoing (chronic) cough or a night-time cough.  Wearing away of tooth enamel.  Weight loss. How is this diagnosed? Your health care provider will take a medical history and perform a physical exam. To determine if you have mild or severe GERD, your health care provider may also monitor how you respond to treatment. You may also have tests, including:  A test to examine your stomach and esophagus with a small camera (endoscopy).  A test thatmeasures the acidity level in your esophagus.  A test thatmeasures how much pressure is on your esophagus.  A barium swallow or modified barium swallow test to show the shape, size, and functioning of your esophagus. How is this treated? The goal of treatment is to help relieve your symptoms and to prevent complications. Treatment for this condition may vary depending on how severe your symptoms are. Your health care provider may recommend:  Changes to your diet.  Medicine.  Surgery. Follow these instructions at home: Eating and drinking   Follow a diet as recommended by your health care provider. This may involve avoiding foods and drinks such as: ? Coffee and tea (with or without caffeine). ? Drinks that containalcohol. ? Energy drinks and sports drinks. ? Carbonated drinks or sodas. ? Chocolate and cocoa. ? Peppermint and mint flavorings. ? Garlic and onions. ? Horseradish. ? Spicy and acidic foods, including peppers, chili powder, curry powder, vinegar, hot sauces, and barbecue sauce. ? Citrus fruit juices and citrus   fruits, such as oranges, lemons, and limes. ? Tomato-based foods, such as red sauce, chili, salsa, and pizza with red sauce. ? Fried and fatty foods, such as donuts, french fries, potato chips, and high-fat dressings. ? High-fat  meats, such as hot dogs and fatty cuts of red and white meats, such as rib eye steak, sausage, ham, and bacon. ? High-fat dairy items, such as whole milk, butter, and cream cheese.  Eat small, frequent meals instead of large meals.  Avoid drinking large amounts of liquid with your meals.  Avoid eating meals during the 2-3 hours before bedtime.  Avoid lying down right after you eat.  Do not exercise right after you eat. Lifestyle   Do not use any products that contain nicotine or tobacco, such as cigarettes, e-cigarettes, and chewing tobacco. If you need help quitting, ask your health care provider.  Try to reduce your stress by using methods such as yoga or meditation. If you need help reducing stress, ask your health care provider.  If you are overweight, reduce your weight to an amount that is healthy for you. Ask your health care provider for guidance about a safe weight loss goal. General instructions  Pay attention to any changes in your symptoms.  Take over-the-counter and prescription medicines only as told by your health care provider. Do not take aspirin, ibuprofen, or other NSAIDs unless your health care provider told you to do so.  Wear loose-fitting clothing. Do not wear anything tight around your waist that causes pressure on your abdomen.  Raise (elevate) the head of your bed about 6 inches (15 cm).  Avoid bending over if this makes your symptoms worse.  Keep all follow-up visits as told by your health care provider. This is important. Contact a health care provider if:  You have: ? New symptoms. ? Unexplained weight loss. ? Difficulty swallowing or it hurts to swallow. ? Wheezing or a persistent cough. ? A hoarse voice.  Your symptoms do not improve with treatment. Get help right away if you:  Have pain in your arms, neck, jaw, teeth, or back.  Feel sweaty, dizzy, or light-headed.  Have chest pain or shortness of breath.  Vomit and your vomit looks  like blood or coffee grounds.  Faint.  Have stool that is bloody or black.  Cannot swallow, drink, or eat. Summary  Gastroesophageal reflux happens when acid from the stomach flows up into the esophagus. GERD is a disease in which the reflux happens often, causes frequent or severe symptoms, or causes problems such as damage to the esophagus.  Treatment for this condition may vary depending on how severe your symptoms are. Your health care provider may recommend diet and lifestyle changes, medicine, or surgery.  Contact a health care provider if you have new or worsening symptoms.  Take over-the-counter and prescription medicines only as told by your health care provider. Do not take aspirin, ibuprofen, or other NSAIDs unless your health care provider told you to do so.  Keep all follow-up visits as told by your health care provider. This is important. This information is not intended to replace advice given to you by your health care provider. Make sure you discuss any questions you have with your health care provider. Document Revised: 04/14/2018 Document Reviewed: 04/14/2018 Elsevier Patient Education  2020 [place] for Gastroesophageal Reflux Disease, Adult When you have gastroesophageal reflux disease (GERD), the foods you eat and your eating habits are very important. Choosing the right foods  can help ease the discomfort of GERD. Consider working with a diet and nutrition specialist (dietitian) to help you make healthy food choices. What general guidelines should I follow?  Eating plan  Choose healthy foods low in fat, such as fruits, vegetables, whole grains, low-fat dairy products, and lean meat, fish, and poultry.  Eat frequent, small meals instead of three large meals each day. Eat your meals slowly, in a relaxed setting. Avoid bending over or lying down until 2-3 hours after eating.  Limit high-fat foods such as fatty meats or fried foods.  Limit your  intake of oils, butter, and shortening to less than 8 teaspoons each day.  Avoid the following: ? Foods that cause symptoms. These may be different for different people. Keep a food diary to keep track of foods that cause symptoms. ? Alcohol. ? Drinking large amounts of liquid with meals. ? Eating meals during the 2-3 hours before bed.  Cook foods using methods other than frying. This may include baking, grilling, or broiling. Lifestyle  Maintain a healthy weight. Ask your health care provider what weight is healthy for you. If you need to lose weight, work with your health care provider to do so safely.  Exercise for at least 30 minutes on 5 or more days each week, or as told by your health care provider.  Avoid wearing clothes that fit tightly around your waist and chest.  Do not use any products that contain nicotine or tobacco, such as cigarettes and e-cigarettes. If you need help quitting, ask your health care provider.  Sleep with the head of your bed raised. Use a wedge under the mattress or blocks under the bed frame to raise the head of the bed. What foods are not recommended? The items listed may not be a complete list. Talk with your dietitian about what dietary choices are best for you. Grains Pastries or quick breads with added fat. [place] toast. Vegetables Deep fried vegetables. [place] fries. Any vegetables prepared with added fat. Any vegetables that cause symptoms. For some people this may include tomatoes and tomato products, chili peppers, onions and garlic, and horseradish. Fruits Any fruits prepared with added fat. Any fruits that cause symptoms. For some people this may include citrus fruits, such as oranges, grapefruit, pineapple, and lemons. Meats and other protein foods High-fat meats, such as fatty beef or pork, hot dogs, ribs, ham, sausage, salami and bacon. Fried meat or protein, including fried fish and fried chicken. Nuts and nut butters. Dairy Whole milk  and chocolate milk. Sour cream. Cream. Ice cream. Cream cheese. Milk shakes. Beverages Coffee and tea, with or without caffeine. Carbonated beverages. Sodas. Energy drinks. Fruit juice made with acidic fruits (such as orange or grapefruit). Tomato juice. Alcoholic drinks. Fats and oils Butter. Margarine. Shortening. Ghee. Sweets and desserts Chocolate and cocoa. Donuts. Seasoning and other foods Pepper. Peppermint and spearmint. Any condiments, herbs, or seasonings that cause symptoms. For some people, this may include curry, hot sauce, or vinegar-based salad dressings. Summary  When you have gastroesophageal reflux disease (GERD), food and lifestyle choices are very important to help ease the discomfort of GERD.  Eat frequent, small meals instead of three large meals each day. Eat your meals slowly, in a relaxed setting. Avoid bending over or lying down until 2-3 hours after eating.  Limit high-fat foods such as fatty meat or fried foods. This information is not intended to replace advice given to you by your health care provider. Make sure you discuss  any questions you have with your health care provider. Document Revised: 01/27/2019 Document Reviewed: 10/07/2016 Elsevier Patient Education  [place].    Budget-Friendly Healthy Eating There are many ways to save money at the grocery store and continue to eat healthy. You can be successful if you:  Plan meals according to your budget.  Make a grocery list and only purchase food according to your grocery list.  Prepare food yourself. What are tips for following this plan?  Reading food labels  Compare food labels between brand name foods and the store brand. Often the nutritional value is the same, but the store brand is lower cost.  Look for products that do not have added sugar, fat, or salt (sodium). These often cost the same but are healthier for you. Products may be labeled  as: ? Sugar-free. ? Nonfat. ? Low-fat. ? Sodium-free. ? Low-sodium.  Look for lean ground beef labeled as at least 92% lean and 8% fat. Shopping  Buy only the items on your grocery list and go only to the areas of the store that have the items on your list.  Use coupons only for foods and brands you normally buy. Avoid buying items you wouldn't normally buy simply because they are on sale.  Check online and in newspapers for weekly deals.  Buy healthy items from the bulk bins when available, such as herbs, spices, flour, pasta, nuts, and dried fruit.  Buy fruits and vegetables that are in season. Prices are usually lower on in-season produce.  Look at the unit price on the price tag. Use it to compare different brands and sizes to find out which item is the best deal.  Choose healthy items that are often low-cost, such as carrots, potatoes, apples, bananas, and oranges. Dried or canned beans are a low-cost protein source.  Buy in bulk and freeze extra food. Items you can buy in bulk include meats, fish, poultry, frozen fruits, and frozen vegetables.  Avoid buying "ready-to-eat" foods, such as pre-cut fruits and vegetables and pre-made salads.  If possible, shop around to discover where you can find the best prices. Consider other retailers such as dollar stores, larger [employer], local fruit and vegetable stands, and farmers markets.  Do not shop when you are hungry. If you shop while hungry, it may be hard to stick to your list and budget.  Resist impulse buying. Use your grocery list as your official plan for the week.  Buy a variety of vegetables and fruits by purchasing fresh, frozen, and canned items.  Look at the top and bottom shelves for deals. Foods at eye level (eye level of an adult or child) are usually more expensive.  Be efficient with your time when shopping. The more time you spend at the store, the more money you are likely to spend.  To save money when  choosing more expensive foods like meats and dairy: ? Choose cheaper cuts of meat, such as bone-in chicken thighs and drumsticks instead of skinless and boneless chicken. When you are ready to prepare the chicken, you can remove the skin yourself to make it healthier. ? Choose lean meats like chicken or [place] instead of beef. ? Choose canned seafood, such as tuna, salmon, or sardines. ? Buy eggs as a low-cost source of protein. ? Buy dried beans and peas, such as lentils, split peas, or kidney beans instead of meats. Dried beans and peas are a good alternative source of protein. ? Buy the larger tubs  of yogurt instead of individual-sized containers.  Choose water instead of sodas and other sweetened beverages.  Avoid buying chips, cookies, and other "junk food." These items are usually expensive and not healthy. Cooking  Make extra food and freeze the extras in meal-sized containers or in individual portions for fast meals and snacks.  Pre-cook on days when you have extra time to prepare meals in advance. You can keep these meals in the fridge or freezer and reheat for a quick meal.  When you come home from the grocery store, wash, peel, and cut fruits and vegetables so they are ready to use and eat. This will help reduce food waste. Meal planning  Do not eat out or get fast food. Prepare food at home.  Make a grocery list and make sure to bring it with you to the store. If you have a smart phone, you could use your phone to create your shopping list.  Plan meals and snacks according to a grocery list and budget you create.  Use leftovers in your meal plan for the week.  Look for recipes where you can cook once and make enough food for two meals.  Include budget-friendly meals like stews, casseroles, and stir-fry dishes.  Try some meatless meals or try "no cook" meals like salads.  Make sure that half your plate is filled with fruits or vegetables. Choose from fresh, frozen, or  canned fruits and vegetables. If eating canned, remember to rinse them before eating. This will remove any excess salt added for packaging. Summary  Eating healthy on a budget is possible if you plan your meals according to your budget, purchase according to your budget and grocery list, and prepare food yourself.  Tips for buying more food on a limited budget include buying generic brands, using coupons only for foods you normally buy, and buying healthy items from the bulk bins when available.  Tips for buying cheaper food to replace expensive food include choosing cheaper, lean cuts of meat, and buying dried beans and peas. This information is not intended to replace advice given to you by your health care provider. Make sure you discuss any questions you have with your health care provider. Document Revised: 10/07/2017 Document Reviewed: 10/07/2017 Elsevier Patient Education  [place].

## 2019-11-01 ENCOUNTER — Encounter: Payer: Self-pay | Admitting: Internal Medicine

## 2019-11-04 DIAGNOSIS — R300 Dysuria: Secondary | ICD-10-CM | POA: Diagnosis not present

## 2019-11-04 DIAGNOSIS — E119 Type 2 diabetes mellitus without complications: Secondary | ICD-10-CM | POA: Diagnosis not present

## 2019-11-06 LAB — COMPREHENSIVE METABOLIC PANEL
ALT: 37 IU/L — ABNORMAL HIGH (ref 0–32)
AST: 24 IU/L (ref 0–40)
Albumin/Globulin Ratio: 2.1 (ref 1.2–2.2)
Albumin: 4.7 g/dL (ref 3.8–4.9)
Alkaline Phosphatase: 59 IU/L (ref 39–117)
BUN/Creatinine Ratio: 13 (ref 9–23)
BUN: 11 mg/dL (ref 6–24)
Bilirubin Total: 0.4 mg/dL (ref 0.0–1.2)
CO2: 26 mmol/L (ref 20–29)
Calcium: 10.4 mg/dL — ABNORMAL HIGH (ref 8.7–10.2)
Chloride: 97 mmol/L (ref 96–106)
Creatinine, Ser: 0.86 mg/dL (ref 0.57–1.00)
GFR calc Af Amer: 90 mL/min/{1.73_m2} (ref >59)
GFR calc non Af Amer: 78 mL/min/{1.73_m2} (ref >59)
Globulin, Total: 2.2 g/dL (ref 1.5–4.5)
Glucose: 119 mg/dL — ABNORMAL HIGH (ref 65–99)
Potassium: 4.3 mmol/L (ref 3.5–5.2)
Sodium: 142 mmol/L (ref 134–144)
Total Protein: 6.9 g/dL (ref 6.0–8.5)

## 2019-11-06 LAB — HEMOGLOBIN A1C
Est. average glucose Bld gHb Est-mCnc: 146 mg/dL
Hgb A1c MFr Bld: 6.7 % — ABNORMAL HIGH (ref 4.8–5.6)

## 2019-11-06 LAB — URINALYSIS, ROUTINE W REFLEX MICROSCOPIC
Bilirubin, UA: NEGATIVE
Glucose, UA: NEGATIVE
Ketones, UA: NEGATIVE
Leukocytes,UA: NEGATIVE
Nitrite, UA: NEGATIVE
Protein,UA: NEGATIVE
RBC, UA: NEGATIVE
Specific Gravity, UA: 1.018 (ref 1.005–1.030)
Urobilinogen, Ur: 0.2 mg/dL (ref 0.2–1.0)
pH, UA: 7.5 (ref 5.0–7.5)

## 2019-11-06 LAB — URINE CULTURE

## 2019-11-06 LAB — LIPID PANEL
Chol/HDL Ratio: 4.4 ratio (ref 0.0–4.4)
Cholesterol, Total: 208 mg/dL — ABNORMAL HIGH (ref 100–199)
HDL: 47 mg/dL (ref >39)
LDL Chol Calc (NIH): 122 mg/dL — ABNORMAL HIGH (ref 0–99)
Triglycerides: 221 mg/dL — ABNORMAL HIGH (ref 0–149)
VLDL Cholesterol Cal: 39 mg/dL (ref 5–40)

## 2019-11-06 LAB — HEPATITIS B SURFACE ANTIBODY, QUANTITATIVE: Hepatitis B Surf Ab Quant: 20.5 m[IU]/mL (ref >9.9)

## 2019-11-06 NOTE — Addendum Note (Signed)
Addended by: Orland Mustard on: 11/06/2019 05:52 PM   Modules accepted: Orders

## 2019-11-07 ENCOUNTER — Other Ambulatory Visit: Payer: Self-pay | Admitting: Internal Medicine

## 2019-11-07 ENCOUNTER — Encounter: Payer: Self-pay | Admitting: Internal Medicine

## 2019-11-07 DIAGNOSIS — E785 Hyperlipidemia, unspecified: Secondary | ICD-10-CM

## 2019-11-07 MED ORDER — PRAVASTATIN SODIUM 20 MG PO TABS: 20.0000 mg | ORAL_TABLET | Freq: Every day | ORAL | 3 refills | Status: DC

## 2019-11-11 ENCOUNTER — Encounter: Payer: Self-pay | Admitting: Internal Medicine

## 2019-11-11 DIAGNOSIS — R1310 Dysphagia, unspecified: Secondary | ICD-10-CM | POA: Diagnosis not present

## 2019-11-11 DIAGNOSIS — K5900 Constipation, unspecified: Secondary | ICD-10-CM | POA: Diagnosis not present

## 2019-11-11 DIAGNOSIS — K589 Irritable bowel syndrome without diarrhea: Secondary | ICD-10-CM | POA: Diagnosis not present

## 2019-11-11 DIAGNOSIS — K219 Gastro-esophageal reflux disease without esophagitis: Secondary | ICD-10-CM | POA: Diagnosis not present

## 2019-11-14 ENCOUNTER — Other Ambulatory Visit: Payer: Self-pay | Admitting: Internal Medicine

## 2019-11-14 DIAGNOSIS — E119 Type 2 diabetes mellitus without complications: Secondary | ICD-10-CM

## 2019-11-14 MED ORDER — OZEMPIC (0.25 OR 0.5 MG/DOSE) 2 MG/1.5ML ~~LOC~~ SOPN: 0.2500 mg | PEN_INJECTOR | SUBCUTANEOUS | 2 refills | Status: DC

## 2019-11-14 MED ORDER — PEN NEEDLES 30G X 8 MM MISC: 1.0000 | Freq: Every day | 11 refills | Status: DC

## 2019-11-15 ENCOUNTER — Other Ambulatory Visit: Payer: Self-pay | Admitting: Internal Medicine

## 2019-11-15 DIAGNOSIS — I10 Essential (primary) hypertension: Secondary | ICD-10-CM

## 2019-11-15 MED ORDER — LOSARTAN POTASSIUM-HCTZ 100-25 MG PO TABS: 1.0000 | ORAL_TABLET | Freq: Every day | ORAL | 3 refills | Status: DC

## 2019-11-21 DIAGNOSIS — K219 Gastro-esophageal reflux disease without esophagitis: Secondary | ICD-10-CM | POA: Diagnosis not present

## 2019-11-21 DIAGNOSIS — Z9119 Patient's noncompliance with other medical treatment and regimen: Secondary | ICD-10-CM | POA: Diagnosis not present

## 2019-11-21 DIAGNOSIS — Z538 Procedure and treatment not carried out for other reasons: Secondary | ICD-10-CM | POA: Diagnosis not present

## 2019-11-21 DIAGNOSIS — R1310 Dysphagia, unspecified: Secondary | ICD-10-CM | POA: Diagnosis not present

## 2019-11-29 DIAGNOSIS — K2950 Unspecified chronic gastritis without bleeding: Secondary | ICD-10-CM | POA: Diagnosis not present

## 2019-11-29 DIAGNOSIS — K3189 Other diseases of stomach and duodenum: Secondary | ICD-10-CM | POA: Diagnosis not present

## 2019-11-29 DIAGNOSIS — K449 Diaphragmatic hernia without obstruction or gangrene: Secondary | ICD-10-CM | POA: Diagnosis not present

## 2019-11-29 DIAGNOSIS — R1310 Dysphagia, unspecified: Secondary | ICD-10-CM | POA: Diagnosis not present

## 2019-11-29 DIAGNOSIS — K219 Gastro-esophageal reflux disease without esophagitis: Secondary | ICD-10-CM | POA: Diagnosis not present

## 2019-12-06 ENCOUNTER — Other Ambulatory Visit: Payer: Self-pay | Admitting: Internal Medicine

## 2019-12-06 DIAGNOSIS — R748 Abnormal levels of other serum enzymes: Secondary | ICD-10-CM | POA: Diagnosis not present

## 2019-12-08 LAB — COMPREHENSIVE METABOLIC PANEL
ALT: 29 IU/L (ref 0–32)
AST: 18 IU/L (ref 0–40)
Albumin/Globulin Ratio: 2 (ref 1.2–2.2)
Albumin: 4.7 g/dL (ref 3.8–4.9)
Alkaline Phosphatase: 63 IU/L (ref 39–117)
BUN/Creatinine Ratio: 17 (ref 9–23)
BUN: 14 mg/dL (ref 6–24)
Bilirubin Total: 0.2 mg/dL (ref 0.0–1.2)
CO2: 27 mmol/L (ref 20–29)
Calcium: 10.5 mg/dL — ABNORMAL HIGH (ref 8.7–10.2)
Chloride: 98 mmol/L (ref 96–106)
Creatinine, Ser: 0.82 mg/dL (ref 0.57–1.00)
GFR calc Af Amer: 96 mL/min/{1.73_m2} (ref >59)
GFR calc non Af Amer: 83 mL/min/{1.73_m2} (ref >59)
Globulin, Total: 2.4 g/dL (ref 1.5–4.5)
Glucose: 113 mg/dL — ABNORMAL HIGH (ref 65–99)
Potassium: 4.3 mmol/L (ref 3.5–5.2)
Sodium: 143 mmol/L (ref 134–144)
Total Protein: 7.1 g/dL (ref 6.0–8.5)

## 2019-12-08 LAB — PTH, INTACT AND CALCIUM: PTH: 30 pg/mL (ref 15–65)

## 2019-12-09 ENCOUNTER — Other Ambulatory Visit: Payer: Self-pay | Admitting: Internal Medicine

## 2019-12-09 NOTE — Progress Notes (Signed)
Order has been faxed to add on.

## 2019-12-12 ENCOUNTER — Encounter: Payer: Self-pay | Admitting: Internal Medicine

## 2019-12-12 ENCOUNTER — Other Ambulatory Visit: Payer: Self-pay | Admitting: Internal Medicine

## 2019-12-12 DIAGNOSIS — R110 Nausea: Secondary | ICD-10-CM

## 2019-12-12 MED ORDER — ONDANSETRON HCL 4 MG PO TABS: 4.0000 mg | ORAL_TABLET | Freq: Three times a day (TID) | ORAL | 0 refills | Status: DC | PRN

## 2019-12-13 ENCOUNTER — Ambulatory Visit (INDEPENDENT_AMBULATORY_CARE_PROVIDER_SITE_OTHER): Payer: BC Managed Care – PPO | Admitting: Diagnostic Neuroimaging

## 2019-12-13 ENCOUNTER — Telehealth: Payer: Self-pay | Admitting: Internal Medicine

## 2019-12-13 ENCOUNTER — Other Ambulatory Visit: Payer: Self-pay

## 2019-12-13 ENCOUNTER — Encounter: Payer: Self-pay | Admitting: Diagnostic Neuroimaging

## 2019-12-13 VITALS — BP 138/80 | HR 97 | Temp 97.5°F | Ht 60.0 in | Wt 174.8 lb

## 2019-12-13 DIAGNOSIS — R202 Paresthesia of skin: Secondary | ICD-10-CM | POA: Diagnosis not present

## 2019-12-13 DIAGNOSIS — R200 Anesthesia of skin: Secondary | ICD-10-CM | POA: Diagnosis not present

## 2019-12-13 NOTE — Patient Instructions (Signed)
-   consider lab testing and EMG/NCS

## 2019-12-13 NOTE — Telephone Encounter (Signed)
Call pt calcium elevated again  Unable to add on ionized calcium with [facility]  Can she go there and get this test done?  This is better test to check for cause of elevated calcium   TMS

## 2019-12-13 NOTE — Progress Notes (Signed)
[facility]  PATIENT: Angela Simon DOB: [birth year]  REFERRING CLINICIAN: McLean-Scocuzza, Olivia Mackie * HISTORY FROM: patient and husband  REASON FOR VISIT: new consult    HISTORICAL  CHIEF COMPLAINT:  Chief Complaint  Patient presents with  . Numbness, tingling    rm 7 New Pt husband- Tom "numbness, tingling in hands, fingers getting progressively worse"    HISTORY OF PRESENT ILLNESS:   54 year old female here for evaluation of numbness and tingling.  Patient reports 4 to 5 years of intermittent numbness and tingling in her hands and fingers.  Symptoms have worsened over time.  In the past 6 months she has noticed numbness and tingling in her feet as well.  She also has intermittent muscle twitches in her arms and legs.  She has had some discomfort in her upper thoracic and lower cervical region, has been to chiropractor.  Patient also has recent diagnosis of diabetes in last 1 year with hemoglobin A1c's ranging in the 6.7-7.0 range.  Now on diabetes medications.   REVIEW OF SYSTEMS: Full 14 system review of systems performed and negative with exception of: As per HPI.  ALLERGIES: Allergies  Allergen Reactions  . Metformin And Related     Diarrhea     HOME MEDICATIONS: Outpatient Medications Prior to Visit  Medication Sig Dispense Refill  . bifidobacterium infantis (ALIGN) capsule Take by mouth.    . cetirizine (ZYRTEC) 10 MG tablet Take 10 mg by mouth daily.    Marland Kitchen FLUoxetine (PROZAC) 20 MG tablet Take 20 mg by mouth daily.    . Insulin Pen Needle (PEN NEEDLES) 30G X 8 MM MISC 1 Device by Does not apply route daily. 30 each 11  . losartan-hydrochlorothiazide (HYZAAR) 100-25 MG tablet Take 1 tablet by mouth daily. In am 90 tablet 3  . Multiple Vitamin (MULTIVITAMIN) tablet Take 1 tablet by mouth daily.    . Omega-3 1000 MG CAPS Take by mouth.    . ondansetron (ZOFRAN) 4 MG tablet Take 1 tablet (4 mg total) by mouth every 8 (eight) hours as needed  for nausea or vomiting. 40 tablet 0  . pantoprazole (PROTONIX) 40 MG tablet Take 40 mg by mouth daily.    . pravastatin (PRAVACHOL) 20 MG tablet Take 1 tablet (20 mg total) by mouth at bedtime. 90 tablet 3  . Semaglutide,0.25 or 0.5MG /DOS, (OZEMPIC, 0.25 OR 0.5 MG/DOSE,) 2 MG/1.5ML SOPN Inject 0.25 mg into the skin once a week. 5 pen 2  . dicyclomine (BENTYL) 10 MG capsule Take 1 capsule (10 mg total) by mouth 2 (two) times daily as needed for up to 30 days for spasms. 60 capsule 0  . ferrous sulfate 325 (65 FE) MG tablet Take 325 mg by mouth daily with breakfast.    . omeprazole (PRILOSEC) 20 MG capsule TAKE 1 CAPSULE BY MOUTH TWICE DAILY BEFORE A MEAL (Patient not taking: Reported on 12/13/2019) 180 capsule 0  . phentermine (ADIPEX-P) 37.5 MG tablet Take 1 tablet (37.5 mg total) by mouth daily before breakfast. (Patient not taking: Reported on 12/13/2019) 60 tablet 0  . sitaGLIPtin (JANUVIA) 100 MG tablet Take 1 tablet (100 mg total) by mouth daily. (Patient not taking: Reported on 12/13/2019) 90 tablet 3   No facility-administered medications prior to visit.    PAST MEDICAL HISTORY: Past Medical History:  Diagnosis Date  . Allergy   . Essential hypertension   . Fatty liver   . GERD (gastroesophageal reflux disease)   . Gestational diabetes   .  Hyperlipidemia   . IBS (irritable bowel syndrome)   . Kidney stones    x 3 occurrences   . Melanoma ([place])   . Obesity (BMI 30-39.9)   . Renal disorder     PAST SURGICAL HISTORY: Past Surgical History:  Procedure Laterality Date  . BREAST REDUCTION SURGERY    . CESAREAN SECTION    . LITHOTRIPSY     x1  . MELANOMA EXCISION     Right arm  . NASAL SINUS SURGERY    . TONSILLECTOMY      FAMILY HISTORY: Family History  Problem Relation Age of Onset  . Heart attack Mother   . Hypertension Mother   . Heart disease Mother        MI  . Diabetes Father   . Prostate cancer Father   . Breast cancer Sister     SOCIAL HISTORY: Social  History   Socioeconomic History  . Marital status: Married    Spouse name: Gershon Mussel  . Number of children: 2  . Years of education: Not on file  . Highest education level: High school graduate  Occupational History  . Not on file  Tobacco Use  . Smoking status: Never Smoker  . Smokeless tobacco: Never Used  Substance and Sexual Activity  . Alcohol use: No  . Drug use: No  . Sexual activity: Not on file  Other Topics Concern  . Not on file  Social History Narrative   Married since 48 y.o    2 children.   No grandchildren.   Self employed.   Enjoys shopping, decorating.    Social Determinants of Health   Financial Resource Strain:   . Difficulty of Paying Living Expenses: Not on file  Food Insecurity:   . Worried About Charity fundraiser in the Last Year: Not on file  . Ran Out of Food in the Last Year: Not on file  Transportation Needs:   . Lack of Transportation (Medical): Not on file  . Lack of Transportation (Non-Medical): Not on file  Physical Activity:   . Days of Exercise per Week: Not on file  . Minutes of Exercise per Session: Not on file  Stress:   . Feeling of Stress : Not on file  Social Connections:   . Frequency of Communication with Friends and Family: Not on file  . Frequency of Social Gatherings with Friends and Family: Not on file  . Attends Religious Services: Not on file  . Active Member of Clubs or Organizations: Not on file  . Attends Archivist Meetings: Not on file  . Marital Status: Not on file  Intimate Partner Violence:   . Fear of Current or Ex-Partner: Not on file  . Emotionally Abused: Not on file  . Physically Abused: Not on file  . Sexually Abused: Not on file     PHYSICAL EXAM  GENERAL EXAM/CONSTITUTIONAL: Vitals:  Vitals:   12/13/19 1449  BP: 138/80  Pulse: 97  Temp: (!) 97.5 F (36.4 C)  Weight: 174 lb 12.8 oz (79.3 kg)  Height: 5' (1.524 m)     Body mass index is 34.14 kg/m. Wt Readings from Last 3  Encounters:  12/13/19 174 lb 12.8 oz (79.3 kg)  10/27/19 169 lb (76.7 kg)  07/20/19 177 lb 6.4 oz (80.5 kg)     Patient is in no distress; well developed, nourished and groomed; neck is supple  CARDIOVASCULAR:  Examination of carotid arteries is normal; no carotid bruits  Regular rate  and rhythm, no murmurs  Examination of peripheral vascular system by observation and palpation is normal  EYES:  Ophthalmoscopic exam of optic discs and posterior segments is normal; no papilledema or hemorrhages  No exam data present  MUSCULOSKELETAL:  Gait, strength, tone, movements noted in Neurologic exam below  NEUROLOGIC: MENTAL STATUS:  No flowsheet data found.  awake, alert, oriented to person, place and time  recent and remote memory intact  normal attention and concentration  language fluent, comprehension intact, naming intact  fund of knowledge appropriate  CRANIAL NERVE:   2nd - no papilledema on fundoscopic exam  2nd, 3rd, 4th, 6th - pupils equal and reactive to light, visual fields full to confrontation, extraocular muscles intact, no nystagmus  5th - facial sensation symmetric  7th - facial strength symmetric  8th - hearing intact  9th - palate elevates symmetrically, uvula midline  11th - shoulder shrug symmetric  12th - tongue protrusion midline  MOTOR:   normal bulk and tone, full strength in the BUE, BLE; EXCEPT ATROPHY AND WEAKNESS OF BILATERAL APB  SENSORY:   normal and symmetric to light touch, pinprick, temperature, vibration  POSITIVE PHALENS; NEG TINELS  COORDINATION:   finger-nose-finger, fine finger movements normal  REFLEXES:   deep tendon reflexes 1+ and symmetric  GAIT/STATION:   narrow based gait     DIAGNOSTIC DATA (LABS, IMAGING, TESTING) - I reviewed patient records, labs, notes, testing and imaging myself where available.  Lab Results  Component Value Date   WBC 5.2 07/20/2019   HGB 13.0 07/20/2019   HCT 38.3  07/20/2019   MCV 90 07/20/2019   PLT 369 07/20/2019      Component Value Date/Time   NA 143 12/06/2019 0000   K 4.3 12/06/2019 0000   CL 98 12/06/2019 0000   CO2 27 12/06/2019 0000   GLUCOSE 113 (H) 12/06/2019 0000   GLUCOSE 136 (H) 05/20/2019 0942   BUN 14 12/06/2019 0000   CREATININE 0.82 12/06/2019 0000   CALCIUM 10.5 (H) 12/06/2019 0000   PROT 7.1 12/06/2019 0000   ALBUMIN 4.7 12/06/2019 0000   AST 18 12/06/2019 0000   ALT 29 12/06/2019 0000   ALKPHOS 63 12/06/2019 0000   BILITOT 0.2 12/06/2019 0000   GFRNONAA 83 12/06/2019 0000   GFRAA 96 12/06/2019 0000   Lab Results  Component Value Date   CHOL 208 (H) 11/04/2019   HDL 47 11/04/2019   LDLCALC 122 (H) 11/04/2019   TRIG 221 (H) 11/04/2019   CHOLHDL 4.4 11/04/2019   Lab Results  Component Value Date   HGBA1C 6.7 (H) 11/04/2019   Lab Results  Component Value Date   VITAMINB12 658 02/06/2017   Lab Results  Component Value Date   TSH 2.630 07/20/2019       ASSESSMENT AND PLAN  54 y.o. year old female here with intermittent numbness and tingling in the hands for past 5 years, with worsening numbness, twitching in hands and feet for past 6 months.  Neurologic examination notable for signs of bilateral carpal tunnel syndrome.  Also may have mild underlying generalized peripheral neuropathy.  Offered to check additional work-up such as neuropathy lab testing and EMG nerve conduction study.  Patient would like to try response for several weeks, continue diabetes treatments, and reevaluate symptoms in a few months.  Dx:  1. Numbness and tingling      PLAN:  NUMBNESS / TWITCHING - continue diabetes treatment - try carpal tunnel wrist splints at bedtime - may consider neuropathy  lab testing and EMG/NCS in future  Return for pending if symptoms worsen or fail to improve.    Penni Bombard, MD [id number], [id number] PM Certified in Neurology, Neurophysiology and Neuroimaging  [facility] [address], [place], [place] [address] [phone]

## 2019-12-13 NOTE — Addendum Note (Signed)
Addended by: Orland Mustard on: 12/13/2019 05:39 PM   Modules accepted: Orders

## 2019-12-14 NOTE — Telephone Encounter (Signed)
Order faxed into [facility]. Patient will go in tomorrow, 2/25, to have this done.

## 2019-12-15 ENCOUNTER — Encounter: Payer: Self-pay | Admitting: Internal Medicine

## 2019-12-16 LAB — CALCIUM, IONIZED: Calcium, Ion: 5.3 mg/dL (ref 4.5–5.6)

## 2020-01-17 ENCOUNTER — Encounter: Payer: Self-pay | Admitting: Internal Medicine

## 2020-01-17 DIAGNOSIS — R110 Nausea: Secondary | ICD-10-CM | POA: Diagnosis not present

## 2020-01-17 DIAGNOSIS — K5900 Constipation, unspecified: Secondary | ICD-10-CM | POA: Diagnosis not present

## 2020-01-17 DIAGNOSIS — K219 Gastro-esophageal reflux disease without esophagitis: Secondary | ICD-10-CM | POA: Diagnosis not present

## 2020-02-01 ENCOUNTER — Encounter: Payer: Self-pay | Admitting: Internal Medicine

## 2020-02-10 DIAGNOSIS — R110 Nausea: Secondary | ICD-10-CM | POA: Diagnosis not present

## 2020-02-10 DIAGNOSIS — K219 Gastro-esophageal reflux disease without esophagitis: Secondary | ICD-10-CM | POA: Diagnosis not present

## 2020-02-10 DIAGNOSIS — K30 Functional dyspepsia: Secondary | ICD-10-CM | POA: Diagnosis not present

## 2020-03-15 ENCOUNTER — Other Ambulatory Visit: Payer: Self-pay | Admitting: Internal Medicine

## 2020-03-15 DIAGNOSIS — E119 Type 2 diabetes mellitus without complications: Secondary | ICD-10-CM

## 2020-03-15 MED ORDER — OZEMPIC (0.25 OR 0.5 MG/DOSE) 2 MG/1.5ML ~~LOC~~ SOPN: 0.2500 mg | PEN_INJECTOR | SUBCUTANEOUS | 2 refills | Status: DC

## 2020-03-27 DIAGNOSIS — M9901 Segmental and somatic dysfunction of cervical region: Secondary | ICD-10-CM | POA: Diagnosis not present

## 2020-03-27 DIAGNOSIS — R519 Headache, unspecified: Secondary | ICD-10-CM | POA: Diagnosis not present

## 2020-03-27 DIAGNOSIS — M5414 Radiculopathy, thoracic region: Secondary | ICD-10-CM | POA: Diagnosis not present

## 2020-03-27 DIAGNOSIS — M9902 Segmental and somatic dysfunction of thoracic region: Secondary | ICD-10-CM | POA: Diagnosis not present

## 2020-03-29 ENCOUNTER — Encounter: Payer: Self-pay | Admitting: Internal Medicine

## 2020-04-10 ENCOUNTER — Encounter: Payer: Self-pay | Admitting: Internal Medicine

## 2020-04-17 ENCOUNTER — Encounter: Payer: Self-pay | Admitting: Internal Medicine

## 2020-04-19 ENCOUNTER — Encounter: Payer: Self-pay | Admitting: Internal Medicine

## 2020-04-24 ENCOUNTER — Other Ambulatory Visit: Payer: Self-pay

## 2020-04-24 ENCOUNTER — Encounter: Payer: Self-pay | Admitting: Internal Medicine

## 2020-04-24 ENCOUNTER — Ambulatory Visit (INDEPENDENT_AMBULATORY_CARE_PROVIDER_SITE_OTHER): Payer: BC Managed Care – PPO | Admitting: Internal Medicine

## 2020-04-24 VITALS — BP 138/90 | HR 108 | Temp 98.5°F | Ht 60.0 in | Wt 175.1 lb

## 2020-04-24 DIAGNOSIS — R000 Tachycardia, unspecified: Secondary | ICD-10-CM | POA: Diagnosis not present

## 2020-04-24 DIAGNOSIS — F419 Anxiety disorder, unspecified: Secondary | ICD-10-CM | POA: Diagnosis not present

## 2020-04-24 DIAGNOSIS — E669 Obesity, unspecified: Secondary | ICD-10-CM

## 2020-04-24 DIAGNOSIS — I10 Essential (primary) hypertension: Secondary | ICD-10-CM

## 2020-04-24 DIAGNOSIS — E1159 Type 2 diabetes mellitus with other circulatory complications: Secondary | ICD-10-CM

## 2020-04-24 DIAGNOSIS — R232 Flushing: Secondary | ICD-10-CM

## 2020-04-24 DIAGNOSIS — G2581 Restless legs syndrome: Secondary | ICD-10-CM

## 2020-04-24 DIAGNOSIS — M79605 Pain in left leg: Secondary | ICD-10-CM

## 2020-04-24 DIAGNOSIS — M79604 Pain in right leg: Secondary | ICD-10-CM

## 2020-04-24 MED ORDER — METOPROLOL SUCCINATE ER 25 MG PO TB24: 25.0000 mg | ORAL_TABLET | Freq: Every day | ORAL | 3 refills | Status: DC

## 2020-04-24 MED ORDER — GLIPIZIDE ER 2.5 MG PO TB24: 2.5000 mg | ORAL_TABLET | Freq: Every day | ORAL | 3 refills | Status: DC

## 2020-04-24 MED ORDER — LORAZEPAM 0.5 MG PO TABS: 0.5000 mg | ORAL_TABLET | Freq: Every evening | ORAL | 5 refills | Status: DC | PRN

## 2020-04-24 NOTE — Patient Instructions (Addendum)
[facility]  Or other therapist   Stop Ozempic   Consider effexor   Glipizide Extended-release tablets What is this medicine? GLIPIZIDE (GLIP i zide) helps to treat type 2 diabetes. It is combined with diet and exercise. The medicine helps your body to use insulin better. This medicine may be used for other purposes; ask your health care provider or pharmacist if you have questions. COMMON BRAND NAME(S): Glucotrol XL What should I tell my health care provider before I take this medicine? They need to know if you have any of these conditions:  diabetic ketoacidosis  glucose-6-phosphate dehydrogenase deficiency  heart disease  kidney disease  liver disease  porphyria  severe infection or injury  thyroid disease  an unusual or allergic reaction to glipizide, sulfa drugs, other medicines, foods, dyes, or preservatives  pregnant or trying to get pregnant  breast-feeding How should I use this medicine? Take this medicine by mouth. Follow the directions on the prescription label. Swallow the tablets with a drink of water and take with your breakfast. Take your medicine at the same time each day. Do not take more often than directed. Talk to your pediatrician regarding the use of this medicine in children. Special care may be needed. Elderly patients over 33 years old may have a stronger reaction and need a smaller dose. Overdosage: If you think you have taken too much of this medicine contact a poison control center or emergency room at once. NOTE: This medicine is only for you. Do not share this medicine with others. What if I miss a dose? If you miss a dose, take it as soon as you can. If it is almost time for your next dose, take only that dose. Do not take double or extra doses. What may interact with this medicine?  bosentan  chloramphenicol  cisapride  medicines for fungal or yeast infections  metoclopramide  probenecid  warfarin Many medications may cause  an increase or decrease in blood sugar, these include:  alcohol containing beverages  aspirin and aspirin-like drugs  chloramphenicol  chromium  clarithromycin  female hormones, like estrogens or progestins and birth control pills  heart medicines  isoniazid  female hormones or anabolic steroids  medicines for weight loss  medicines for allergies, asthma, cold, or cough  medicines for mental problems  medicines called MAO Inhibitors like Nardil, Parnate, Marplan, Eldepryl  niacin  NSAIDs, medicines for pain and inflammation, like ibuprofen or naproxen  pentamidine  phenytoin  probenecid  quinolone antibiotics like ciprofloxacin, levofloxacin, ofloxacin  some herbal dietary supplements  steroid medicines like prednisone or cortisone  thyroid medicine  water pills or diuretics This list may not describe all possible interactions. Give your health care provider a list of all the medicines, herbs, non-prescription drugs, or dietary supplements you use. Also tell them if you smoke, drink alcohol, or use illegal drugs. Some items may interact with your medicine. What should I watch for while using this medicine? Visit your doctor or health care professional for regular checks on your progress. A test called the HbA1C (A1C) will be monitored. This is a simple blood test. It measures your blood sugar control over the last 2 to 3 months. You will receive this test every 3 to 6 months. Learn how to check your blood sugar. Learn the symptoms of low and high blood sugar and how to manage them. Always carry a quick-source of sugar with you in case you have symptoms of low blood sugar. Examples include hard sugar  candy or glucose tablets. Make sure others know that you can choke if you eat or drink when you develop serious symptoms of low blood sugar, such as seizures or unconsciousness. They must get medical help at once. Tell your doctor or health care professional if you have  high blood sugar. You might need to change the dose of your medicine. If you are sick or exercising more than usual, you might need to change the dose of your medicine. Do not skip meals. Ask your doctor or health care professional if you should avoid alcohol. Many nonprescription cough and cold products contain sugar or alcohol. These can affect blood sugar. This medicine can make you more sensitive to the sun. Keep out of the sun. If you cannot avoid being in the sun, wear protective clothing and use sunscreen. Do not use sun lamps or tanning beds/booths. Wear a medical ID bracelet or chain, and carry a card that describes your disease and details of your medicine and dosage times. What side effects may I notice from receiving this medicine? Side effects that you should report to your doctor or health care professional as soon as possible:  allergic reactions like skin rash, itching or hives, swelling of the face, lips, or tongue  breathing problems  dark urine  fever, chills, sore throat  signs and symptoms of low blood sugar such as feeling anxious, confusion, dizziness, increased hunger, unusually weak or tired, sweating, shakiness, cold, irritable, headache, blurred vision, fast heartbeat, loss of consciousness  unusual bleeding or bruising  yellowing of the eyes or skin Side effects that usually do not require medical attention (report to your doctor or health care professional if they continue or are bothersome):  diarrhea  dizziness  headache  heartburn  nausea  stomach gas This list may not describe all possible side effects. Call your doctor for medical advice about side effects. You may report side effects to FDA at 1-800-FDA-1088. Where should I keep my medicine? Keep out of the reach of children. Store at room temperature between 15 to 30 degrees C (59 to 86 degrees F). Protect from moisture and humidity. Throw away any unused medicine after the expiration  date. NOTE: This sheet is a summary. It may not cover all possible information. If you have questions about this medicine, talk to your doctor, pharmacist, or health care provider.  2020 Elsevier/Gold Standard (2013-01-19 14:32:16)  Venlafaxine extended-release capsules What is this medicine? VENLAFAXINE(VEN la fax een) is used to treat depression, anxiety and panic disorder. This medicine may be used for other purposes; ask your health care provider or pharmacist if you have questions. COMMON BRAND NAME(S): Effexor XR What should I tell my health care provider before I take this medicine? They need to know if you have any of these conditions:  bleeding disorders  glaucoma  heart disease  high blood pressure  high cholesterol  kidney disease  liver disease  low levels of sodium in the blood  mania or bipolar disorder  seizures  suicidal thoughts, plans, or attempt; a previous suicide attempt by you or a family  take medicines that treat or prevent blood clots  thyroid disease  an unusual or allergic reaction to venlafaxine, desvenlafaxine, other medicines, foods, dyes, or preservatives  pregnant or trying to get pregnant  breast-feeding How should I use this medicine? Take this medicine by mouth with a full glass of water. Follow the directions on the prescription label. Do not cut, crush, or chew this medicine. Take  it with food. If needed, the capsule may be carefully opened and the entire contents sprinkled on a spoonful of cool applesauce. Swallow the applesauce/pellet mixture right away without chewing and follow with a glass of water to ensure complete swallowing of the pellets. Try to take your medicine at about the same time each day. Do not take your medicine more often than directed. Do not stop taking this medicine suddenly except upon the advice of your doctor. Stopping this medicine too quickly may cause serious side effects or your condition may worsen. A  special MedGuide will be given to you by the pharmacist with each prescription and refill. Be sure to read this information carefully each time. Talk to your pediatrician regarding the use of this medicine in children. Special care may be needed. Overdosage: If you think you have taken too much of this medicine contact a poison control center or emergency room at once. NOTE: This medicine is only for you. Do not share this medicine with others. What if I miss a dose? If you miss a dose, take it as soon as you can. If it is almost time for your next dose, take only that dose. Do not take double or extra doses. What may interact with this medicine? Do not take this medicine with any of the following medications:  certain medicines for fungal infections like fluconazole, itraconazole, ketoconazole, posaconazole, voriconazole  cisapride  desvenlafaxine  dronedarone  duloxetine  levomilnacipran  linezolid  MAOIs like Carbex, Eldepryl, Marplan, Nardil, and Parnate  methylene blue (injected into a vein)  milnacipran  pimozide  thioridazine This medicine may also interact with the following medications:  amphetamines  aspirin and aspirin-like medicines  certain medicines for depression, anxiety, or psychotic disturbances  certain medicines for migraine headaches like almotriptan, eletriptan, frovatriptan, naratriptan, rizatriptan, sumatriptan, zolmitriptan  certain medicines for sleep  certain medicines that treat or prevent blood clots like dalteparin, enoxaparin, warfarin  cimetidine  clozapine  diuretics  fentanyl  furazolidone  indinavir  isoniazid  lithium  metoprolol  NSAIDS, medicines for pain and inflammation, like ibuprofen or naproxen  other medicines that prolong the QT interval (cause an abnormal heart rhythm) like dofetilide, ziprasidone  procarbazine  rasagiline  supplements like St. John's wort, kava kava,  valerian  tramadol  tryptophan This list may not describe all possible interactions. Give your health care provider a list of all the medicines, herbs, non-prescription drugs, or dietary supplements you use. Also tell them if you smoke, drink alcohol, or use illegal drugs. Some items may interact with your medicine. What should I watch for while using this medicine? Tell your doctor if your symptoms do not get better or if they get worse. Visit your doctor or health care professional for regular checks on your progress. Because it may take several weeks to see the full effects of this medicine, it is important to continue your treatment as prescribed by your doctor. Patients and their families should watch out for new or worsening thoughts of suicide or depression. Also watch out for sudden changes in feelings such as feeling anxious, agitated, panicky, irritable, hostile, aggressive, impulsive, severely restless, overly excited and hyperactive, or not being able to sleep. If this happens, especially at the beginning of treatment or after a change in dose, call your health care professional. This medicine can cause an increase in blood pressure. Check with your doctor for instructions on monitoring your blood pressure while taking this medicine. You may get drowsy or dizzy. Do  not drive, use machinery, or do anything that needs mental alertness until you know how this medicine affects you. Do not stand or sit up quickly, especially if you are an older patient. This reduces the risk of dizzy or fainting spells. Alcohol may interfere with the effect of this medicine. Avoid alcoholic drinks. Your mouth may get dry. Chewing sugarless gum, sucking hard candy and drinking plenty of water will help. Contact your doctor if the problem does not go away or is severe. What side effects may I notice from receiving this medicine? Side effects that you should report to your doctor or health care professional as soon  as possible:  allergic reactions like skin rash, itching or hives, swelling of the face, lips, or tongue  anxious  breathing problems  confusion  changes in vision  chest pain  confusion  elevated mood, decreased need for sleep, racing thoughts, impulsive behavior  eye pain  fast, irregular heartbeat  feeling faint or lightheaded, falls  feeling agitated, angry, or irritable  hallucination, loss of contact with reality  high blood pressure  loss of balance or coordination  palpitations  redness, blistering, peeling or loosening of the skin, including inside the mouth  restlessness, pacing, inability to keep still  seizures  stiff muscles  suicidal thoughts or other mood changes  trouble passing urine or change in the amount of urine  trouble sleeping  unusual bleeding or bruising  unusually weak or tired  vomiting Side effects that usually do not require medical attention (report to your doctor or health care professional if they continue or are bothersome):  change in sex drive or performance  change in appetite or weight  constipation  dizziness  dry mouth  headache  increased sweating  nausea  tired This list may not describe all possible side effects. Call your doctor for medical advice about side effects. You may report side effects to FDA at 1-800-FDA-1088. Where should I keep my medicine? Keep out of the reach of children. Store at a controlled temperature between 20 and 25 degrees C (68 degrees and 77 degrees F), in a dry place. Throw away any unused medicine after the expiration date. NOTE: This sheet is a summary. It may not cover all possible information. If you have questions about this medicine, talk to your doctor, pharmacist, or health care provider.  2020 Elsevier/Gold Standard (2018-09-28 12:06:43)  Generalized Anxiety Disorder, Adult Generalized anxiety disorder (GAD) is a mental health disorder. People with this  condition constantly worry about everyday events. Unlike normal anxiety, worry related to GAD is not triggered by a specific event. These worries also do not fade or get better with time. GAD interferes with life functions, including relationships, work, and school. GAD can vary from mild to severe. People with severe GAD can have intense waves of anxiety with physical symptoms (panic attacks). What are the causes? The exact cause of GAD is not known. What increases the risk? This condition is more likely to develop in:  Women.  People who have a family history of anxiety disorders.  People who are very shy.  People who experience very stressful life events, such as the death of a loved one.  People who have a very stressful family environment. What are the signs or symptoms? People with GAD often worry excessively about many things in their lives, such as their health and family. They may also be overly concerned about:  Doing well at work.  Being on time.  Natural disasters.  Friendships. Physical symptoms of GAD include:  Fatigue.  Muscle tension or having muscle twitches.  Trembling or feeling shaky.  Being easily startled.  Feeling like your heart is pounding or racing.  Feeling out of breath or like you cannot take a deep breath.  Having trouble falling asleep or staying asleep.  Sweating.  Nausea, diarrhea, or irritable bowel syndrome (IBS).  Headaches.  Trouble concentrating or remembering facts.  Restlessness.  Irritability. How is this diagnosed? Your health care provider can diagnose GAD based on your symptoms and medical history. You will also have a physical exam. The health care provider will ask specific questions about your symptoms, including how severe they are, when they started, and if they come and go. Your health care provider may ask you about your use of alcohol or drugs, including prescription medicines. Your health care provider may  refer you to a mental health specialist for further evaluation. Your health care provider will do a thorough examination and may perform additional tests to rule out other possible causes of your symptoms. To be diagnosed with GAD, a person must have anxiety that:  Is out of his or her control.  Affects several different aspects of his or her life, such as work and relationships.  Causes distress that makes him or her unable to take part in normal activities.  Includes at least three physical symptoms of GAD, such as restlessness, fatigue, trouble concentrating, irritability, muscle tension, or sleep problems. Before your health care provider can confirm a diagnosis of GAD, these symptoms must be present more days than they are not, and they must last for six months or longer. How is this treated? The following therapies are usually used to treat GAD:  Medicine. Antidepressant medicine is usually prescribed for long-term daily control. Antianxiety medicines may be added in severe cases, especially when panic attacks occur.  Talk therapy (psychotherapy). Certain types of talk therapy can be helpful in treating GAD by providing support, education, and guidance. Options include: ? Cognitive behavioral therapy (CBT). People learn coping skills and techniques to ease their anxiety. They learn to identify unrealistic or negative thoughts and behaviors and to replace them with positive ones. ? Acceptance and commitment therapy (ACT). This treatment teaches people how to be mindful as a way to cope with unwanted thoughts and feelings. ? Biofeedback. This process trains you to manage your body's response (physiological response) through breathing techniques and relaxation methods. You will work with a therapist while machines are used to monitor your physical symptoms.  Stress management techniques. These include yoga, meditation, and exercise. A mental health specialist can help determine which  treatment is best for you. Some people see improvement with one type of therapy. However, other people require a combination of therapies. Follow these instructions at home:  Take over-the-counter and prescription medicines only as told by your health care provider.  Try to maintain a normal routine.  Try to anticipate stressful situations and allow extra time to manage them.  Practice any stress management or self-calming techniques as taught by your health care provider.  Do not punish yourself for setbacks or for not making progress.  Try to recognize your accomplishments, even if they are small.  Keep all follow-up visits as told by your health care provider. This is important. Contact a health care provider if:  Your symptoms do not get better.  Your symptoms get worse.  You have signs of depression, such as: ? A persistently sad, cranky, or irritable mood. ?  Loss of enjoyment in activities that used to bring you joy. ? Change in weight or eating. ? Changes in sleeping habits. ? Avoiding friends or family members. ? Loss of energy for normal tasks. ? Feelings of guilt or worthlessness. Get help right away if:  You have serious thoughts about hurting yourself or others. If you ever feel like you may hurt yourself or others, or have thoughts about taking your own life, get help right away. You can go to your nearest emergency department or call:  Your local emergency services (911 in the [place]  A suicide crisis helpline, such as the [facility] at 270-125-8981. This is open 24 hours a day. Summary  Generalized anxiety disorder (GAD) is a mental health disorder that involves worry that is not triggered by a specific event.  People with GAD often worry excessively about many things in their lives, such as their health and family.  GAD may cause physical symptoms such as restlessness, trouble concentrating, sleep problems, frequent sweating,  nausea, diarrhea, headaches, and trembling or muscle twitching.  A mental health specialist can help determine which treatment is best for you. Some people see improvement with one type of therapy. However, other people require a combination of therapies. This information is not intended to replace advice given to you by your health care provider. Make sure you discuss any questions you have with your health care provider. Document Revised: 09/18/2017 Document Reviewed: 08/26/2016 Elsevier Patient Education  [place].  Gastroparesis  Gastroparesis is a condition in which food takes longer than normal to empty from the stomach. The condition is usually long-lasting (chronic). It may also be called delayed gastric emptying. There is no cure, but there are treatments and things that you can do at home to help relieve symptoms. Treating the underlying condition that causes gastroparesis can also help relieve symptoms. What are the causes? In many cases, the cause of this condition is not known. Possible causes include:  A hormone (endocrine) disorder, such as hypothyroidism or diabetes.  A nervous system disease, such as Parkinson's disease or multiple sclerosis.  Cancer, infection, or surgery that affects the stomach or vagus nerve. The vagus nerve runs from your chest, through your neck, to the lower part of your brain.  A connective tissue disorder, such as scleroderma.  Certain medicines. What increases the risk? You are more likely to develop this condition if you:  Have certain disorders or diseases, including: ? An endocrine disorder. ? An eating disorder. ? Amyloidosis. ? Scleroderma. ? Parkinson's disease. ? Multiple sclerosis. ? Cancer or infection of the stomach or the vagus nerve.  Have had surgery on the stomach or vagus nerve.  Take certain medicines.  Are female. What are the signs or symptoms? Symptoms of this condition include:  Feeling full after  eating very little.  Nausea.  Vomiting.  Heartburn.  Abdominal bloating.  Inconsistent blood sugar (glucose) levels on blood tests.  Lack of appetite.  Weight loss.  Acid from the stomach coming up into the esophagus (gastroesophageal reflux).  Sudden tightening (spasm) of the stomach, which can be painful. Symptoms may come and go. Some people may not notice any symptoms. How is this diagnosed? This condition is diagnosed with tests, such as:  Tests that check how long it takes food to move through the stomach and intestines. These tests include: ? Upper gastrointestinal (GI) series. For this test, you drink a liquid that shows up well on X-rays, and then X-rays  will be taken of your intestines. ? Gastric emptying scintigraphy. For this test, you eat food that contains a small amount of radioactive material, and then scans are taken. ? Wireless capsule GI monitoring system. For this test, you swallow a pill (capsule) that records information about how foods and fluid move through your stomach.  Gastric manometry. For this test, a tube is passed down your throat and into your stomach to measure electrical and muscular activity.  Endoscopy. For this test, a long, thin tube is passed down your throat and into your stomach to check for problems in your stomach lining.  Ultrasound. This test uses sound waves to create images of inside the body. This can help rule out gallbladder disease or pancreatitis as a cause of your symptoms. How is this treated? There is no cure for gastroparesis. Treatment may include:  Treating the underlying cause.  Managing your symptoms by making changes to your diet and exercise habits.  Taking medicines to control nausea and vomiting and to stimulate stomach muscles.  Getting food through a feeding tube in the hospital. This may be done in severe cases.  Having surgery to insert a device into your body that helps improve stomach emptying and  control nausea and vomiting (gastric neurostimulator). Follow these instructions at home:  Take over-the-counter and prescription medicines only as told by your health care provider.  Follow instructions from your health care provider about eating or drinking restrictions. Your health care provider may recommend that you: ? Eat smaller meals more often. ? Eat low-fat foods. ? Eat low-fiber forms of high-fiber foods. For example, eat cooked vegetables instead of raw vegetables. ? Have only liquid foods instead of solid foods. Liquid foods are easier to digest.  Drink enough fluid to keep your urine pale yellow.  Exercise as often as told by your health care provider.  Keep all follow-up visits as told by your health care provider. This is important. Contact a health care provider if you:  Notice that your symptoms do not improve with treatment.  Have new symptoms. Get help right away if you:  Have severe abdominal pain that does not improve with treatment.  Have nausea that is severe or does not go away.  Cannot drink fluids without vomiting. Summary  Gastroparesis is a chronic condition in which food takes longer than normal to empty from the stomach.  Symptoms include nausea, vomiting, heartburn, abdominal bloating, and loss of appetite.  Eating smaller portions, and low-fat, low-fiber foods may help you manage your symptoms.  Get help right away if you have severe abdominal pain. This information is not intended to replace advice given to you by your health care provider. Make sure you discuss any questions you have with your health care provider. Document Revised: 01/04/2018 Document Reviewed: 08/11/2017 Elsevier Patient Education  2020 [employer].

## 2020-04-24 NOTE — Progress Notes (Signed)
Patient presenting for a follow up today. Has been having tingling in both legs, restless legs and some leg pain. Patient stopped her Pravastatin a couple of weeks ago after trying to take this every other day.   Patient flagged: Current status:  OVERDUE FOR COLORECTAL CANCER SCREENING Current status:  PATIENT IS OVERDUE FOR BMI FOLLOW UP PLAN BMI is estimated to be 34.2 based on the last recorded weight and height

## 2020-04-24 NOTE — Progress Notes (Signed)
Chief Complaint  Patient presents with  . Follow-up  . Leg Problem   F/u with husband  1. Increased anxiety GAD 7 20 today and reduced sleep wants medication. Declines therapy for now will consider. She is c/w menopause and wants labs checked   2. Increased HR 108 today and feels heart racing at times  3. HTN elevated 150/100 repeat 138/90 on losartan-hctz 100-25 will add BB due to # 2 pt agreeable  4. Seeing GI for gastroparesis explained to pt that we need to stop ozempic 0.25 if having these sx's as this can worsen she was put on reglan by GI and had nausea x 1 week and reduce po and feels reglan made her feel blah, caused wt gain and fatigue  5. C/o restless legs and b/l leg pain declines to see neurology today  6. DM 2 will stop ozempic 0.25 weekly change to glipizide    Review of Systems  Constitutional: Positive for malaise/fatigue.  HENT: Negative for hearing loss.   Eyes: Negative for blurred vision.  Respiratory: Negative for shortness of breath.   Cardiovascular: Positive for palpitations. Negative for chest pain.  Gastrointestinal: Negative for abdominal pain and nausea.  Musculoskeletal: Positive for joint pain.  Skin: Negative for rash.  Neurological: Negative for headaches.  Psychiatric/Behavioral: Negative for depression. The patient is nervous/anxious.    Past Medical History:  Diagnosis Date  . Allergy   . Essential hypertension   . Fatty liver   . GERD (gastroesophageal reflux disease)   . Gestational diabetes   . Hyperlipidemia   . IBS (irritable bowel syndrome)   . Kidney stones    x 3 occurrences   . Melanoma ([place])   . Obesity (BMI 30-39.9)   . Renal disorder    Past Surgical History:  Procedure Laterality Date  . BREAST REDUCTION SURGERY    . CESAREAN SECTION    . LITHOTRIPSY     x1  . MELANOMA EXCISION     Right arm  . NASAL SINUS SURGERY    . TONSILLECTOMY     Family History  Problem Relation Age of Onset  . Heart attack Mother   .  Hypertension Mother   . Heart disease Mother        MI  . Diabetes Father   . Prostate cancer Father   . Breast cancer Sister    Social History   Socioeconomic History  . Marital status: Married    Spouse name: Gershon Mussel  . Number of children: 2  . Years of education: Not on file  . Highest education level: High school graduate  Occupational History  . Not on file  Tobacco Use  . Smoking status: Never Smoker  . Smokeless tobacco: Never Used  Substance and Sexual Activity  . Alcohol use: No  . Drug use: No  . Sexual activity: Not on file  Other Topics Concern  . Not on file  Social History Narrative   Married since 27 y.o    2 children.   No grandchildren.   Self employed.   Enjoys shopping, decorating.    Social Determinants of Health   Financial Resource Strain:   . Difficulty of Paying Living Expenses:   Food Insecurity:   . Worried About Charity fundraiser in the Last Year:   . Arboriculturist in the Last Year:   Transportation Needs:   . Film/video editor (Medical):   Marland Kitchen Lack of Transportation (Non-Medical):   Physical Activity:   .  Days of Exercise per Week:   . Minutes of Exercise per Session:   Stress:   . Feeling of Stress :   Social Connections:   . Frequency of Communication with Friends and Family:   . Frequency of Social Gatherings with Friends and Family:   . Attends Religious Services:   . Active Member of Clubs or Organizations:   . Attends Archivist Meetings:   Marland Kitchen Marital Status:   Intimate Partner Violence:   . Fear of Current or Ex-Partner:   . Emotionally Abused:   Marland Kitchen Physically Abused:   . Sexually Abused:    Current Meds  Medication Sig  . bifidobacterium infantis (ALIGN) capsule Take by mouth.  . cetirizine (ZYRTEC) 10 MG tablet Take 10 mg by mouth daily.  Marland Kitchen FLUoxetine (PROZAC) 20 MG tablet Take 20 mg by mouth daily.  . Insulin Pen Needle (PEN NEEDLES) 30G X 8 MM MISC 1 Device by Does not apply route daily.  Marland Kitchen  losartan-hydrochlorothiazide (HYZAAR) 100-25 MG tablet Take 1 tablet by mouth daily. In am  . Multiple Vitamin (MULTIVITAMIN) tablet Take 1 tablet by mouth daily.  . Omega-3 1000 MG CAPS Take by mouth.  . ondansetron (ZOFRAN) 4 MG tablet Take 1 tablet (4 mg total) by mouth every 8 (eight) hours as needed for nausea or vomiting.  . pantoprazole (PROTONIX) 40 MG tablet Take 40 mg by mouth daily.  . [DISCONTINUED] Semaglutide,0.25 or 0.5MG /DOS, (OZEMPIC, 0.25 OR 0.5 MG/DOSE,) 2 MG/1.5ML SOPN Inject 0.25 mg into the skin once a week.   Allergies  Allergen Reactions  . Metformin And Related     Diarrhea   . Reglan [Metoclopramide]     Did not work fatigue, weight gain, increased anxiety   Recent Results (from the past 2160 hour(s))  [place]     Status: None   Collection Time: 05/08/20  8:06 AM  Result Value Ref Range   FSH 76.0 mIU/ML    Comment: Female Reference Range:  1.4-18.1 mIU/mLFemale Reference Range:Follicular Phase          2.5-10.2 mIU/mLMidCycle Peak          3.4-33.4 mIU/mLLuteal Phase          1.5-9.1 mIU/mLPost Menopausal     23.0-116.3 mIU/mLPregnant          <0.3 mIU/mL  Hemoglobin A1c     Status: Abnormal   Collection Time: 05/08/20  8:06 AM  Result Value Ref Range   Hgb A1c MFr Bld 7.3 (H) 4.6 - 6.5 %    Comment: Glycemic Control Guidelines for People with Diabetes:Non Diabetic:  <6%Goal of Therapy: <7%Additional Action Suggested:  >8%   CBC with Differential/Platelet     Status: None   Collection Time: 05/08/20  8:06 AM  Result Value Ref Range   WBC 6.3 4.0 - 10.5 K/uL   RBC 4.26 3.87 - 5.11 Mil/uL   Hemoglobin 12.8 12.0 - 15.0 g/dL   HCT 37.3 36 - 46 %   MCV 87.7 78.0 - 100.0 fl   MCHC 34.4 30.0 - 36.0 g/dL   RDW 13.2 11.5 - 15.5 %   Platelets 351.0 150 - 400 K/uL   Neutrophils Relative % 67.0 43 - 77 %   Lymphocytes Relative 26.0 12 - 46 %   Monocytes Relative 4.8 3 - 12 %   Eosinophils Relative 1.9 0 - 5 %   Basophils Relative 0.3 0 - 3 %   Neutro Abs 4.2 1.4 -  7.7 K/uL  Lymphs Abs 1.6 0.7 - 4.0 K/uL   Monocytes Absolute 0.3 0 - 1 K/uL   Eosinophils Absolute 0.1 0 - 0 K/uL   Basophils Absolute 0.0 0 - 0 K/uL  Lipid panel     Status: Abnormal   Collection Time: 05/08/20  8:06 AM  Result Value Ref Range   Cholesterol 148 0 - 200 mg/dL    Comment: ATP III Classification       Desirable:  < 200 mg/dL               Borderline High:  200 - 239 mg/dL          High:  > = 240 mg/dL   Triglycerides 265.0 (H) 0 - 149 mg/dL    Comment: Normal:  <150 mg/dLBorderline High:  150 - 199 mg/dL   HDL 40.70 >39.00 mg/dL   VLDL 53.0 (H) 0.0 - 40.0 mg/dL   Total CHOL/HDL Ratio 4     Comment:                Men          Women1/2 Average Risk     3.4          3.3Average Risk          5.0          4.42X Average Risk          9.6          7.13X Average Risk          15.0          11.0                       NonHDL 106.90     Comment: NOTE:  Non-HDL goal should be 30 mg/dL higher than patient's LDL goal (i.e. LDL goal of < 70 mg/dL, would have non-HDL goal of < 100 mg/dL)  Comprehensive metabolic panel     Status: Abnormal   Collection Time: 05/08/20  8:06 AM  Result Value Ref Range   Sodium 140 135 - 145 mEq/L   Potassium 3.5 3.5 - 5.1 mEq/L   Chloride 99 96 - 112 mEq/L   CO2 33 (H) 19 - 32 mEq/L   Glucose, Bld 178 (H) 70 - 99 mg/dL   BUN 11 6 - 23 mg/dL   Creatinine, Ser 0.88 0.40 - 1.20 mg/dL   Total Bilirubin 0.4 0.2 - 1.2 mg/dL   Alkaline Phosphatase 46 39 - 117 U/L   AST 17 0 - 37 U/L   ALT 26 0 - 35 U/L   Total Protein 6.4 6.0 - 8.3 g/dL   Albumin 4.0 3.5 - 5.2 g/dL   GFR 67.57 >60.00 mL/min   Calcium 9.4 8.4 - 10.5 mg/dL  LDL cholesterol, direct     Status: None   Collection Time: 05/08/20  8:06 AM  Result Value Ref Range   Direct LDL 68.0 mg/dL    Comment: Optimal:  <100 mg/dLNear or Above Optimal:  100-129 mg/dLBorderline High:  130-159 mg/dLHigh:  160-189 mg/dLVery High:  >190 mg/dL   Objective  Body mass index is 34.2 kg/m. Wt Readings from  Last 3 Encounters:  04/24/20 175 lb 1.9 oz (79.4 kg)  12/13/19 174 lb 12.8 oz (79.3 kg)  10/27/19 169 lb (76.7 kg)   Temp Readings from Last 3 Encounters:  04/24/20 98.5 F (36.9 C) (Oral)  12/13/19 (!) 97.5 F (36.4 C)  07/20/19 98.3 F (36.8  C)   BP Readings from Last 3 Encounters:  04/24/20 138/90  12/13/19 138/80  07/20/19 132/83   Pulse Readings from Last 3 Encounters:  04/24/20 (!) 108  12/13/19 97  07/20/19 94    Physical Exam Vitals and nursing note reviewed.  Constitutional:      Appearance: Normal appearance. She is well-developed and well-groomed. She is obese.  HENT:     Head: Normocephalic and atraumatic.  Eyes:     Conjunctiva/sclera: Conjunctivae normal.     Pupils: Pupils are equal, round, and reactive to light.  Cardiovascular:     Rate and Rhythm: Normal rate and regular rhythm.     Heart sounds: Normal heart sounds. No murmur heard.   Pulmonary:     Effort: Pulmonary effort is normal.     Breath sounds: Normal breath sounds.  Skin:    General: Skin is warm and dry.     Findings: No rash.  Neurological:     General: No focal deficit present.     Mental Status: She is alert and oriented to person, place, and time. Mental status is at baseline.     Gait: Gait normal.  Psychiatric:        Attention and Perception: Attention and perception normal.        Mood and Affect: Mood and affect normal.        Speech: Speech normal.        Behavior: Behavior normal. Behavior is cooperative.        Thought Content: Thought content normal.        Cognition and Memory: Cognition and memory normal.        Judgment: Judgment normal.     Assessment  Plan  Anxiety - Plan: LORazepam (ATIVAN) 0.5 MG tablet Consider therapy  Consider low dose SSNRI in future I.e effexor or paxil/celexa  Hypertension associated with diabetes ([place]) - Plan: glipiZIDE (GLUCOTROL XL) 2.5 MG 24 hr tablet, metoprolol succinate (TOPROL-XL) 25 MG 24 hr tablet, Comprehensive metabolic  panel, Lipid panel, CBC with Differential/Platelet, Hemoglobin A1c Stop ozempic c/w this contributing to worsening nausea/gastroparesis sx's  Foot exam and eye exam needed in future   Sinus tachycardia - Plan: metoprolol succinate (TOPROL-XL) 25 MG 24 hr tablet  Hot flashes - Plan: [facility] fax'ed copy to ob/gyn   Pain in both lower extremities Restless leg Consider neurology consult in future hold for now per pt  Obesity (BMI 30-39.9)  rec healthy diet and exercise   HM Had flu shot2020 Tdaphad check date had per pt Hep A/B vx today3/3 for fatty liver3rd twinrix today 05/10/19 -hep B immune Consider check hep C consider pna 23 vaccine in the future  Declines covid   Consider shingrix in future   Pap 02/08/18 neg neg hpv  mammo neg [facility] mammogram6/30/2020negative  colonoscopy had 2/17/16bx negativecheck GI when duewill f/u with Dr. Earley Favor GI Referred to Dr. Kellie Moor skinpt has not had derm appt as of 10/27/19 has # to call and schedule will do this rec healthy diet and exercise  [facility] [place] [phone] GERD  IBS, dysphagia/GERD/constipation  Plan: EGD prilosec 20 mg bid f/u in 8 weeks Dr. Esaw Dace 11/11/19 [facility] [place] EGD 11/29/19 erythema in duodenum 2nd part, erythema antrum hiatal hernia bxs taken neg, neg H pylori   BP was 131/82    Provider: Dr. Olivia Mackie McLean-Scocuzza-Internal Medicine

## 2020-04-25 DIAGNOSIS — M9901 Segmental and somatic dysfunction of cervical region: Secondary | ICD-10-CM | POA: Diagnosis not present

## 2020-04-25 DIAGNOSIS — R519 Headache, unspecified: Secondary | ICD-10-CM | POA: Diagnosis not present

## 2020-04-25 DIAGNOSIS — M9902 Segmental and somatic dysfunction of thoracic region: Secondary | ICD-10-CM | POA: Diagnosis not present

## 2020-04-25 DIAGNOSIS — M5414 Radiculopathy, thoracic region: Secondary | ICD-10-CM | POA: Diagnosis not present

## 2020-04-26 ENCOUNTER — Ambulatory Visit: Payer: BC Managed Care – PPO | Admitting: Internal Medicine

## 2020-04-30 ENCOUNTER — Other Ambulatory Visit: Payer: Self-pay | Admitting: Internal Medicine

## 2020-04-30 ENCOUNTER — Encounter: Payer: Self-pay | Admitting: Internal Medicine

## 2020-04-30 ENCOUNTER — Telehealth: Payer: Self-pay | Admitting: Internal Medicine

## 2020-04-30 DIAGNOSIS — Z1231 Encounter for screening mammogram for malignant neoplasm of breast: Secondary | ICD-10-CM

## 2020-04-30 NOTE — Telephone Encounter (Signed)
Pt overdue mammogram please advise and schedule/send order [facility]   Thanks [place]

## 2020-05-02 ENCOUNTER — Encounter: Payer: Self-pay | Admitting: Internal Medicine

## 2020-05-02 DIAGNOSIS — M9901 Segmental and somatic dysfunction of cervical region: Secondary | ICD-10-CM | POA: Diagnosis not present

## 2020-05-02 DIAGNOSIS — R519 Headache, unspecified: Secondary | ICD-10-CM | POA: Diagnosis not present

## 2020-05-02 DIAGNOSIS — M9902 Segmental and somatic dysfunction of thoracic region: Secondary | ICD-10-CM | POA: Diagnosis not present

## 2020-05-02 DIAGNOSIS — M5414 Radiculopathy, thoracic region: Secondary | ICD-10-CM | POA: Diagnosis not present

## 2020-05-02 NOTE — Telephone Encounter (Signed)
Eduardo Osier to McLean-Scocuzza, Nino Glow, MD     05/02/20 9:49 AM [place],  I messaged twice 3 days ago and havent received a response yet.  I see the messages have been read but no reply.  Since my message I am doing better with the beta blocker. I guess I will continue to take it.  I do feel like I have some vertigo. Been taking Dramamine still. Not sure if you can treat that or if I go to ENT. Thanks

## 2020-05-02 NOTE — Telephone Encounter (Signed)
Mammogram reminder sent

## 2020-05-02 NOTE — Telephone Encounter (Signed)
Added to previous Patient message encounter  °

## 2020-05-03 NOTE — Telephone Encounter (Signed)
Noted  

## 2020-05-03 NOTE — Telephone Encounter (Signed)
Faxed order to [facility] on 05/03/2020.

## 2020-05-07 DIAGNOSIS — R42 Dizziness and giddiness: Secondary | ICD-10-CM | POA: Diagnosis not present

## 2020-05-08 ENCOUNTER — Other Ambulatory Visit (INDEPENDENT_AMBULATORY_CARE_PROVIDER_SITE_OTHER): Payer: BC Managed Care – PPO

## 2020-05-08 ENCOUNTER — Other Ambulatory Visit: Payer: Self-pay

## 2020-05-08 DIAGNOSIS — E1159 Type 2 diabetes mellitus with other circulatory complications: Secondary | ICD-10-CM | POA: Diagnosis not present

## 2020-05-08 DIAGNOSIS — I10 Essential (primary) hypertension: Secondary | ICD-10-CM

## 2020-05-08 DIAGNOSIS — R232 Flushing: Secondary | ICD-10-CM | POA: Diagnosis not present

## 2020-05-08 LAB — LIPID PANEL
Cholesterol: 148 mg/dL (ref 0–200)
HDL: 40.7 mg/dL (ref >39.00)
NonHDL: 106.9
Total CHOL/HDL Ratio: 4
Triglycerides: 265 mg/dL — ABNORMAL HIGH (ref 0.0–149.0)
VLDL: 53 mg/dL — ABNORMAL HIGH (ref 0.0–40.0)

## 2020-05-08 LAB — COMPREHENSIVE METABOLIC PANEL
ALT: 26 U/L (ref 0–35)
AST: 17 U/L (ref 0–37)
Albumin: 4 g/dL (ref 3.5–5.2)
Alkaline Phosphatase: 46 U/L (ref 39–117)
BUN: 11 mg/dL (ref 6–23)
CO2: 33 mEq/L — ABNORMAL HIGH (ref 19–32)
Calcium: 9.4 mg/dL (ref 8.4–10.5)
Chloride: 99 mEq/L (ref 96–112)
Creatinine, Ser: 0.88 mg/dL (ref 0.40–1.20)
GFR: 67.57 mL/min (ref >60.00)
Glucose, Bld: 178 mg/dL — ABNORMAL HIGH (ref 70–99)
Potassium: 3.5 mEq/L (ref 3.5–5.1)
Sodium: 140 mEq/L (ref 135–145)
Total Bilirubin: 0.4 mg/dL (ref 0.2–1.2)
Total Protein: 6.4 g/dL (ref 6.0–8.3)

## 2020-05-08 LAB — CBC WITH DIFFERENTIAL/PLATELET
Basophils Absolute: 0 10*3/uL (ref 0.0–0.1)
Basophils Relative: 0.3 % (ref 0.0–3.0)
Eosinophils Absolute: 0.1 10*3/uL (ref 0.0–0.7)
Eosinophils Relative: 1.9 % (ref 0.0–5.0)
HCT: 37.3 % (ref 36.0–46.0)
Hemoglobin: 12.8 g/dL (ref 12.0–15.0)
Lymphocytes Relative: 26 % (ref 12.0–46.0)
Lymphs Abs: 1.6 10*3/uL (ref 0.7–4.0)
MCHC: 34.4 g/dL (ref 30.0–36.0)
MCV: 87.7 fl (ref 78.0–100.0)
Monocytes Absolute: 0.3 10*3/uL (ref 0.1–1.0)
Monocytes Relative: 4.8 % (ref 3.0–12.0)
Neutro Abs: 4.2 10*3/uL (ref 1.4–7.7)
Neutrophils Relative %: 67 % (ref 43.0–77.0)
Platelets: 351 10*3/uL (ref 150.0–400.0)
RBC: 4.26 Mil/uL (ref 3.87–5.11)
RDW: 13.2 % (ref 11.5–15.5)
WBC: 6.3 10*3/uL (ref 4.0–10.5)

## 2020-05-08 LAB — FOLLICLE STIMULATING HORMONE: FSH: 76 m[IU]/mL

## 2020-05-08 LAB — LDL CHOLESTEROL, DIRECT: Direct LDL: 68 mg/dL

## 2020-05-08 LAB — HEMOGLOBIN A1C: Hgb A1c MFr Bld: 7.3 % — ABNORMAL HIGH (ref 4.6–6.5)

## 2020-05-09 ENCOUNTER — Encounter: Payer: Self-pay | Admitting: Internal Medicine

## 2020-05-09 LAB — HM DIABETES EYE EXAM

## 2020-05-14 ENCOUNTER — Telehealth: Payer: Self-pay | Admitting: Internal Medicine

## 2020-05-14 NOTE — Telephone Encounter (Signed)
Thank you for checking my Echo.   Can a copy of those results be sent to my GYN? Angela Simon at [facility]?   Appears patient going through menopausal changes on cover to GYN    Results for Angela Simon, Angela Simon (MRN [id number]) as of 05/14/2020 02:25  Ref. Range 05/08/2020 08:06  [facility] Latest Units: mIU/ML 76.0

## 2020-05-14 NOTE — Telephone Encounter (Signed)
Faxed via epic routing

## 2020-05-16 ENCOUNTER — Encounter: Payer: Self-pay | Admitting: Internal Medicine

## 2020-05-16 DIAGNOSIS — E1159 Type 2 diabetes mellitus with other circulatory complications: Secondary | ICD-10-CM | POA: Insufficient documentation

## 2020-05-16 DIAGNOSIS — R000 Tachycardia, unspecified: Secondary | ICD-10-CM | POA: Insufficient documentation

## 2020-05-16 DIAGNOSIS — I152 Hypertension secondary to endocrine disorders: Secondary | ICD-10-CM | POA: Insufficient documentation

## 2020-05-17 DIAGNOSIS — Z1231 Encounter for screening mammogram for malignant neoplasm of breast: Secondary | ICD-10-CM | POA: Diagnosis not present

## 2020-05-23 DIAGNOSIS — R5383 Other fatigue: Secondary | ICD-10-CM | POA: Diagnosis not present

## 2020-05-23 DIAGNOSIS — R61 Generalized hyperhidrosis: Secondary | ICD-10-CM | POA: Diagnosis not present

## 2020-05-23 DIAGNOSIS — R7989 Other specified abnormal findings of blood chemistry: Secondary | ICD-10-CM | POA: Diagnosis not present

## 2020-05-23 DIAGNOSIS — Z01419 Encounter for gynecological examination (general) (routine) without abnormal findings: Secondary | ICD-10-CM | POA: Diagnosis not present

## 2020-05-23 DIAGNOSIS — Z975 Presence of (intrauterine) contraceptive device: Secondary | ICD-10-CM | POA: Diagnosis not present

## 2020-05-25 ENCOUNTER — Other Ambulatory Visit: Payer: Self-pay | Admitting: Internal Medicine

## 2020-05-29 DIAGNOSIS — M5414 Radiculopathy, thoracic region: Secondary | ICD-10-CM | POA: Diagnosis not present

## 2020-05-29 DIAGNOSIS — R519 Headache, unspecified: Secondary | ICD-10-CM | POA: Diagnosis not present

## 2020-05-29 DIAGNOSIS — M9902 Segmental and somatic dysfunction of thoracic region: Secondary | ICD-10-CM | POA: Diagnosis not present

## 2020-05-29 DIAGNOSIS — M9901 Segmental and somatic dysfunction of cervical region: Secondary | ICD-10-CM | POA: Diagnosis not present

## 2020-05-30 ENCOUNTER — Encounter: Payer: Self-pay | Admitting: Internal Medicine

## 2020-06-01 ENCOUNTER — Other Ambulatory Visit: Payer: Self-pay | Admitting: Internal Medicine

## 2020-06-01 DIAGNOSIS — I152 Hypertension secondary to endocrine disorders: Secondary | ICD-10-CM

## 2020-06-01 DIAGNOSIS — E1159 Type 2 diabetes mellitus with other circulatory complications: Secondary | ICD-10-CM

## 2020-06-01 DIAGNOSIS — E119 Type 2 diabetes mellitus without complications: Secondary | ICD-10-CM

## 2020-06-01 MED ORDER — OZEMPIC (0.25 OR 0.5 MG/DOSE) 2 MG/1.5ML ~~LOC~~ SOPN: 0.2500 mg | PEN_INJECTOR | SUBCUTANEOUS | 5 refills | Status: DC

## 2020-06-01 MED ORDER — PEN NEEDLES 30G X 8 MM MISC: 1.0000 | Freq: Every day | 0 refills | Status: DC

## 2020-06-05 ENCOUNTER — Other Ambulatory Visit: Payer: Self-pay | Admitting: Internal Medicine

## 2020-06-05 DIAGNOSIS — E1159 Type 2 diabetes mellitus with other circulatory complications: Secondary | ICD-10-CM

## 2020-06-05 MED ORDER — GLIPIZIDE ER 5 MG PO TB24: 5.0000 mg | ORAL_TABLET | Freq: Every day | ORAL | 3 refills | Status: DC

## 2020-06-19 DIAGNOSIS — M5414 Radiculopathy, thoracic region: Secondary | ICD-10-CM | POA: Diagnosis not present

## 2020-06-19 DIAGNOSIS — M9901 Segmental and somatic dysfunction of cervical region: Secondary | ICD-10-CM | POA: Diagnosis not present

## 2020-06-19 DIAGNOSIS — M9902 Segmental and somatic dysfunction of thoracic region: Secondary | ICD-10-CM | POA: Diagnosis not present

## 2020-06-19 DIAGNOSIS — R519 Headache, unspecified: Secondary | ICD-10-CM | POA: Diagnosis not present

## 2020-07-25 DIAGNOSIS — Z6833 Body mass index (BMI) 33.0-33.9, adult: Secondary | ICD-10-CM | POA: Diagnosis not present

## 2020-07-25 DIAGNOSIS — Z79890 Hormone replacement therapy (postmenopausal): Secondary | ICD-10-CM | POA: Diagnosis not present

## 2020-07-26 ENCOUNTER — Ambulatory Visit (INDEPENDENT_AMBULATORY_CARE_PROVIDER_SITE_OTHER): Payer: BC Managed Care – PPO | Admitting: Internal Medicine

## 2020-07-26 ENCOUNTER — Encounter: Payer: Self-pay | Admitting: Internal Medicine

## 2020-07-26 ENCOUNTER — Other Ambulatory Visit: Payer: Self-pay

## 2020-07-26 VITALS — BP 122/90 | HR 98 | Temp 98.4°F | Ht 60.0 in | Wt 172.6 lb

## 2020-07-26 DIAGNOSIS — Z13818 Encounter for screening for other digestive system disorders: Secondary | ICD-10-CM

## 2020-07-26 DIAGNOSIS — G4700 Insomnia, unspecified: Secondary | ICD-10-CM

## 2020-07-26 DIAGNOSIS — E1159 Type 2 diabetes mellitus with other circulatory complications: Secondary | ICD-10-CM

## 2020-07-26 DIAGNOSIS — F419 Anxiety disorder, unspecified: Secondary | ICD-10-CM | POA: Diagnosis not present

## 2020-07-26 DIAGNOSIS — H1033 Unspecified acute conjunctivitis, bilateral: Secondary | ICD-10-CM

## 2020-07-26 DIAGNOSIS — I152 Hypertension secondary to endocrine disorders: Secondary | ICD-10-CM

## 2020-07-26 MED ORDER — ERYTHROMYCIN 5 MG/GM OP OINT: 1.0000 "application " | TOPICAL_OINTMENT | Freq: Every day | OPHTHALMIC | 0 refills | Status: DC

## 2020-07-26 MED ORDER — GLIPIZIDE ER 2.5 MG PO TB24: 2.5000 mg | ORAL_TABLET | Freq: Every day | ORAL | 3 refills | Status: DC

## 2020-07-26 NOTE — Progress Notes (Signed)
Chief Complaint  Patient presents with  . Follow-up   F/u  1. DM 2 cbg 120s to 150s recently 120s and went to 66 at end of the day tried glucose tablet with help  2. Anxiety/menopause/insomnia improved with estrogen and ativan 0.5 takes prn for sleep  3. HTN on hyzaar 100-25 and 122/79 recently at MD visit doing well controlled and improved  4. RLS sx's/leg pain resolved  5. Eyes red x 1 day tried otc lubricant eye drops   Review of Systems  Constitutional: Negative for weight loss.  HENT: Negative for hearing loss.   Eyes: Negative for blurred vision.  Respiratory: Negative for shortness of breath.   Cardiovascular: Negative for chest pain.  Musculoskeletal: Negative for falls.  Skin: Negative for rash.  Psychiatric/Behavioral: The patient is not nervous/anxious.    Past Medical History:  Diagnosis Date  . Allergy   . Essential hypertension   . Fatty liver   . GERD (gastroesophageal reflux disease)   . Gestational diabetes   . Hyperlipidemia   . IBS (irritable bowel syndrome)   . Kidney stones    x 3 occurrences   . Melanoma ([place])   . Obesity (BMI 30-39.9)   . Renal disorder   . Shingles    Past Surgical History:  Procedure Laterality Date  . BREAST REDUCTION SURGERY    . CESAREAN SECTION    . LITHOTRIPSY     x1  . MELANOMA EXCISION     Right arm  . NASAL SINUS SURGERY    . TONSILLECTOMY     Family History  Problem Relation Age of Onset  . Heart attack Mother   . Hypertension Mother   . Heart disease Mother        MI  . Diabetes Father   . Prostate cancer Father   . Breast cancer Sister    Social History   Socioeconomic History  . Marital status: Married    Spouse name: Gershon Mussel  . Number of children: 2  . Years of education: Not on file  . Highest education level: High school graduate  Occupational History  . Not on file  Tobacco Use  . Smoking status: Never Smoker  . Smokeless tobacco: Never Used  Substance and Sexual Activity  . Alcohol use: No   . Drug use: No  . Sexual activity: Not on file  Other Topics Concern  . Not on file  Social History Narrative   Married since 58 y.o    2 children.   No grandchildren.   Self employed.   Enjoys shopping, decorating.    Social Determinants of Health   Financial Resource Strain:   . Difficulty of Paying Living Expenses: Not on file  Food Insecurity:   . Worried About Charity fundraiser in the Last Year: Not on file  . Ran Out of Food in the Last Year: Not on file  Transportation Needs:   . Lack of Transportation (Medical): Not on file  . Lack of Transportation (Non-Medical): Not on file  Physical Activity:   . Days of Exercise per Week: Not on file  . Minutes of Exercise per Session: Not on file  Stress:   . Feeling of Stress : Not on file  Social Connections:   . Frequency of Communication with Friends and Family: Not on file  . Frequency of Social Gatherings with Friends and Family: Not on file  . Attends Religious Services: Not on file  . Active Member of Clubs or  Organizations: Not on file  . Attends Archivist Meetings: Not on file  . Marital Status: Not on file  Intimate Partner Violence:   . Fear of Current or Ex-Partner: Not on file  . Emotionally Abused: Not on file  . Physically Abused: Not on file  . Sexually Abused: Not on file   Current Meds  Medication Sig  . bifidobacterium infantis (ALIGN) capsule Take by mouth.  . cetirizine (ZYRTEC) 10 MG tablet Take 10 mg by mouth daily.  Marland Kitchen estradiol (VIVELLE-DOT) 0.0375 MG/24HR Place onto the skin.  Marland Kitchen FLUoxetine (PROZAC) 20 MG tablet Take 20 mg by mouth daily.  Marland Kitchen glipiZIDE (GLUCOTROL XL) 2.5 MG 24 hr tablet Take 1 tablet (2.5 mg total) by mouth daily with breakfast.  . Insulin Pen Needle (PEN NEEDLES) 30G X 8 MM MISC 1 Device by Does not apply route daily.  Marland Kitchen LORazepam (ATIVAN) 0.5 MG tablet Take 1 tablet (0.5 mg total) by mouth at bedtime as needed for anxiety.  Marland Kitchen losartan-hydrochlorothiazide (HYZAAR)  100-25 MG tablet Take 1 tablet by mouth daily. In am  . Multiple Vitamin (MULTIVITAMIN) tablet Take 1 tablet by mouth daily.  . Omega-3 1000 MG CAPS Take by mouth.  . ondansetron (ZOFRAN) 4 MG tablet Take 1 tablet (4 mg total) by mouth every 8 (eight) hours as needed for nausea or vomiting.  . pantoprazole (PROTONIX) 40 MG tablet Take 40 mg by mouth daily.  . pravastatin (PRAVACHOL) 20 MG tablet Take 1 tablet (20 mg total) by mouth at bedtime.  . [DISCONTINUED] glipiZIDE (GLUCOTROL XL) 5 MG 24 hr tablet Take 1 tablet (5 mg total) by mouth daily with breakfast.   Allergies  Allergen Reactions  . Metformin     Diarrhea  . Metformin And Related     Diarrhea   . Reglan [Metoclopramide]     Did not work fatigue, weight gain, increased anxiety   Recent Results (from the past 2160 hour(s))  [place]     Status: None   Collection Time: 05/08/20  8:06 AM  Result Value Ref Range   FSH 76.0 mIU/ML    Comment: Female Reference Range:  1.4-18.1 mIU/mLFemale Reference Range:Follicular Phase          2.5-10.2 mIU/mLMidCycle Peak          3.4-33.4 mIU/mLLuteal Phase          1.5-9.1 mIU/mLPost Menopausal     23.0-116.3 mIU/mLPregnant          <0.3 mIU/mL  Hemoglobin A1c     Status: Abnormal   Collection Time: 05/08/20  8:06 AM  Result Value Ref Range   Hgb A1c MFr Bld 7.3 (H) 4.6 - 6.5 %    Comment: Glycemic Control Guidelines for People with Diabetes:Non Diabetic:  <6%Goal of Therapy: <7%Additional Action Suggested:  >8%   CBC with Differential/Platelet     Status: None   Collection Time: 05/08/20  8:06 AM  Result Value Ref Range   WBC 6.3 4.0 - 10.5 K/uL   RBC 4.26 3.87 - 5.11 Mil/uL   Hemoglobin 12.8 12.0 - 15.0 g/dL   HCT 37.3 36 - 46 %   MCV 87.7 78.0 - 100.0 fl   MCHC 34.4 30.0 - 36.0 g/dL   RDW 13.2 11.5 - 15.5 %   Platelets 351.0 150 - 400 K/uL   Neutrophils Relative % 67.0 43 - 77 %   Lymphocytes Relative 26.0 12 - 46 %   Monocytes Relative 4.8 3 - 12 %  Eosinophils Relative 1.9 0 - 5 %    Basophils Relative 0.3 0 - 3 %   Neutro Abs 4.2 1.4 - 7.7 K/uL   Lymphs Abs 1.6 0.7 - 4.0 K/uL   Monocytes Absolute 0.3 0.1 - 1.0 K/uL   Eosinophils Absolute 0.1 0 - 0 K/uL   Basophils Absolute 0.0 0 - 0 K/uL  Lipid panel     Status: Abnormal   Collection Time: 05/08/20  8:06 AM  Result Value Ref Range   Cholesterol 148 0 - 200 mg/dL    Comment: ATP III Classification       Desirable:  < 200 mg/dL               Borderline High:  200 - 239 mg/dL          High:  > = 240 mg/dL   Triglycerides 265.0 (H) 0 - 149 mg/dL    Comment: Normal:  <150 mg/dLBorderline High:  150 - 199 mg/dL   HDL 40.70 >39.00 mg/dL   VLDL 53.0 (H) 0.0 - 40.0 mg/dL   Total CHOL/HDL Ratio 4     Comment:                Men          Women1/2 Average Risk     3.4          3.3Average Risk          5.0          4.42X Average Risk          9.6          7.13X Average Risk          15.0          11.0                       NonHDL 106.90     Comment: NOTE:  Non-HDL goal should be 30 mg/dL higher than patient's LDL goal (i.e. LDL goal of < 70 mg/dL, would have non-HDL goal of < 100 mg/dL)  Comprehensive metabolic panel     Status: Abnormal   Collection Time: 05/08/20  8:06 AM  Result Value Ref Range   Sodium 140 135 - 145 mEq/L   Potassium 3.5 3.5 - 5.1 mEq/L   Chloride 99 96 - 112 mEq/L   CO2 33 (H) 19 - 32 mEq/L   Glucose, Bld 178 (H) 70 - 99 mg/dL   BUN 11 6 - 23 mg/dL   Creatinine, Ser 0.88 0.40 - 1.20 mg/dL   Total Bilirubin 0.4 0.2 - 1.2 mg/dL   Alkaline Phosphatase 46 39 - 117 U/L   AST 17 0 - 37 U/L   ALT 26 0 - 35 U/L   Total Protein 6.4 6.0 - 8.3 g/dL   Albumin 4.0 3.5 - 5.2 g/dL   GFR 67.57 >60.00 mL/min   Calcium 9.4 8.4 - 10.5 mg/dL  LDL cholesterol, direct     Status: None   Collection Time: 05/08/20  8:06 AM  Result Value Ref Range   Direct LDL 68.0 mg/dL    Comment: Optimal:  <100 mg/dLNear or Above Optimal:  100-129 mg/dLBorderline High:  130-159 mg/dLHigh:  160-189 mg/dLVery High:  >190 mg/dL    Objective  Body mass index is 33.71 kg/m. Wt Readings from Last 3 Encounters:  07/26/20 172 lb 9.6 oz (78.3 kg)  04/24/20 175 lb 1.9 oz (79.4 kg)  12/13/19 174 lb 12.8  oz (79.3 kg)   Temp Readings from Last 3 Encounters:  07/26/20 98.4 F (36.9 C)  04/24/20 98.5 F (36.9 C) (Oral)  12/13/19 (!) 97.5 F (36.4 C)   BP Readings from Last 3 Encounters:  07/26/20 122/90  04/24/20 138/90  12/13/19 138/80   Pulse Readings from Last 3 Encounters:  07/26/20 98  04/24/20 (!) 108  12/13/19 97    Physical Exam Vitals and nursing note reviewed.  Constitutional:      Appearance: Normal appearance. She is well-developed and well-groomed. She is obese.  HENT:     Head: Normocephalic and atraumatic.  Eyes:     Conjunctiva/sclera: Conjunctivae normal.     Pupils: Pupils are equal, round, and reactive to light.  Cardiovascular:     Rate and Rhythm: Normal rate and regular rhythm.     Heart sounds: Normal heart sounds. No murmur heard.   Pulmonary:     Effort: Pulmonary effort is normal.     Breath sounds: Normal breath sounds.  Skin:    General: Skin is warm and dry.  Neurological:     General: No focal deficit present.     Mental Status: She is alert and oriented to person, place, and time. Mental status is at baseline.     Gait: Gait normal.  Psychiatric:        Attention and Perception: Attention and perception normal.        Mood and Affect: Mood and affect normal.        Speech: Speech normal.        Behavior: Behavior normal. Behavior is cooperative.        Thought Content: Thought content normal.        Cognition and Memory: Cognition and memory normal.        Judgment: Judgment normal.     Assessment  Plan  Acute conjunctivitis of both eyes, unspecified acute conjunctivitis type - Plan: erythromycin ophthalmic ointment  Hypertension associated with diabetes ([place]) - Plan: glipiZIDE (GLUCOTROL XL) 2.5 MG 24 hr tablet, Comprehensive metabolic panel, Lipid panel,  CBC with Differential/Platelet, Hemoglobin A1c Reduce glip from 5 to 2.5 due to hypoglycemia  Anxiety Insomnia, unspecified type  -cont ativan 0.5 prn sparingly   HM Had flu shot2020 07/16/20 [facility]  Tdaphad check date had per pt Hep A/B vx today3/3 for fatty liver3rd twinrix today 05/10/19 -hep B immune Consider check hep C consider pna 23 vaccine in the future Declines covid  Consider shingrix in future h/o shingles  Pap 02/08/18 neg neg hpv   mammo neg [facility] mammogram7/29/21negative  colonoscopy had 2/17/16bx negativecheck GI when duewill f/u with Dr. Earley Favor GI No FH colon cancer   Referredto Dr. Kellie Moor skinpt has not had derm appt as of 10/27/19 has # to call and schedule will do this rec healthy diet and exercise  [facility] [place] [phone] GERD  IBS, dysphagia/GERD/constipation  Plan: EGD prilosec 20 mg bid f/u in 8 weeks Dr. Esaw Dace 1/22/21[facility] [place] [facility] EGD 11/29/19 erythema in duodenum 2nd part, erythema antrum hiatal hernia bxs taken neg, neg H pylori  BP was 131/82   Provider: Dr. Olivia Mackie McLean-Scocuzza-Internal Medicine

## 2020-07-26 NOTE — Patient Instructions (Addendum)
Similasan complete eye relief   Tdaphad check date had per pt Pfizer vaccine consider  Consider pneumonia 23 vaccine good for 5 years  We also have shingles vaccine x 2 doses   Hypoglycemia Hypoglycemia occurs when the level of sugar (glucose) in the blood is too low. Hypoglycemia can happen in people who do or do not have diabetes. It can develop quickly, and it can be a medical emergency. For most people with diabetes, a blood glucose level below 70 mg/dL (3.9 mmol/L) is considered hypoglycemia. Glucose is a type of sugar that provides the body's main source of energy. Certain hormones (insulin and glucagon) control the level of glucose in the blood. Insulin lowers blood glucose, and glucagon raises blood glucose. Hypoglycemia can result from having too much insulin in the bloodstream, or from not eating enough food that contains glucose. You may also have reactive hypoglycemia, which happens within 4 hours after eating a meal. What are the causes? Hypoglycemia occurs most often in people who have diabetes and may be caused by:  Diabetes medicine.  Not eating enough, or not eating often enough.  Increased physical activity.  Drinking alcohol on an empty stomach. If you do not have diabetes, hypoglycemia may be caused by:  A tumor in the pancreas.  Not eating enough, or not eating for long periods at a time (fasting).  A severe infection or illness.  Certain medicines. What increases the risk? Hypoglycemia is more likely to develop in:  People who have diabetes and take medicines to lower blood glucose.  People who abuse alcohol.  People who have a severe illness. What are the signs or symptoms? Mild symptoms Mild hypoglycemia may not cause any symptoms. If you do have symptoms, they may include:  Hunger.  Anxiety.  Sweating and feeling clammy.  Dizziness or feeling light-headed.  Sleepiness.  Nausea.  Increased heart rate.  Headache.  Blurry  vision.  Irritability.  Tingling or numbness around the mouth, lips, or tongue.  A change in coordination.  Restless sleep. Moderate symptoms Moderate hypoglycemia can cause:  Mental confusion and poor judgment.  Behavior changes.  Weakness.  Irregular heartbeat. Severe symptoms Severe hypoglycemia is a medical emergency. It can cause:  Fainting.  Seizures.  Loss of consciousness (coma).  Death. How is this diagnosed? Hypoglycemia is diagnosed with a blood test to measure your blood glucose level. This blood test is done while you are having symptoms. Your health care provider may also do a physical exam and review your medical history. How is this treated? This condition can often be treated by immediately eating or drinking something that contains sugar, such as:  Fruit juice, 4-6 oz (120-150 mL).  Regular soda (not diet soda), 4-6 oz (120-150 mL).  Low-fat milk, 4 oz (120 mL).  Several pieces of hard candy.  Sugar or honey, 1 Tbsp (15 mL). Treating hypoglycemia if you have diabetes If you are alert and able to swallow safely, follow the 15:15 rule:  Take 15 grams of a rapid-acting carbohydrate. Talk with your health care provider about how much you should take.  Rapid-acting options include: ? Glucose pills (take 15 grams). ? 6-8 pieces of hard candy. ? 4-6 oz (120-150 mL) of fruit juice. ? 4-6 oz (120-150 mL) of regular (not diet) soda. ? 1 Tbsp (15 mL) honey or sugar.  Check your blood glucose 15 minutes after you take the carbohydrate.  If the repeat blood glucose level is still at or below 70 mg/dL (3.9 mmol/L),  take 15 grams of a carbohydrate again.  If your blood glucose level does not increase above 70 mg/dL (3.9 mmol/L) after 3 tries, seek emergency medical care.  After your blood glucose level returns to normal, eat a meal or a snack within 1 hour.  Treating severe hypoglycemia Severe hypoglycemia is when your blood glucose level is at or  below 54 mg/dL (3 mmol/L). Severe hypoglycemia is a medical emergency. Get medical help right away. If you have severe hypoglycemia and you cannot eat or drink, you may need an injection of glucagon. A family member or close friend should learn how to check your blood glucose and how to give you a glucagon injection. Ask your health care provider if you need to have an emergency glucagon injection kit available. Severe hypoglycemia may need to be treated in a hospital. The treatment may include getting glucose through an IV. You may also need treatment for the cause of your hypoglycemia. Follow these instructions at home:  General instructions  Take over-the-counter and prescription medicines only as told by your health care provider.  Monitor your blood glucose as told by your health care provider.  Limit alcohol intake to no more than 1 drink a day for nonpregnant women and 2 drinks a day for men. One drink equals 12 oz of beer (355 mL), 5 oz of wine (148 mL), or 1 oz of hard liquor (44 mL).  Keep all follow-up visits as told by your health care provider. This is important. If you have diabetes:  Always have a rapid-acting carbohydrate snack with you to treat low blood glucose.  Follow your diabetes management plan as directed. Make sure you: ? Know the symptoms of hypoglycemia. It is important to treat it right away to prevent it from becoming severe. ? Take your medicines as directed. ? Follow your exercise plan. ? Follow your meal plan. Eat on time, and do not skip meals. ? Check your blood glucose as often as directed. Always check before and after exercise. ? Follow your sick day plan whenever you cannot eat or drink normally. Make this plan in advance with your health care provider.  Share your diabetes management plan with people in your workplace, school, and household.  Check your urine for ketones when you are ill and as told by your health care provider.  Carry a medical  alert card or wear medical alert jewelry. Contact a health care provider if:  You have problems keeping your blood glucose in your target range.  You have frequent episodes of hypoglycemia. Get help right away if:  You continue to have hypoglycemia symptoms after eating or drinking something containing glucose.  Your blood glucose is at or below 54 mg/dL (3 mmol/L).  You have a seizure.  You faint. These symptoms may represent a serious problem that is an emergency. Do not wait to see if the symptoms will go away. Get medical help right away. Call your local emergency services (911 in the [place] Summary  Hypoglycemia occurs when the level of sugar (glucose) in the blood is too low.  Hypoglycemia can happen in people who do or do not have diabetes. It can develop quickly, and it can be a medical emergency.  Make sure you know the symptoms of hypoglycemia and how to treat it.  Always have a rapid-acting carbohydrate snack with you to treat low blood sugar. This information is not intended to replace advice given to you by your health care provider. Make sure you  discuss any questions you have with your health care provider. Document Revised: 03/29/2018 Document Reviewed: 11/09/2015 Elsevier Patient Education  [place].    Hypoglycemia Hypoglycemia occurs when the level of sugar (glucose) in the blood is too low. Hypoglycemia can happen in people who do or do not have diabetes. It can develop quickly, and it can be a medical emergency. For most people with diabetes, a blood glucose level below 70 mg/dL (3.9 mmol/L) is considered hypoglycemia. Glucose is a type of sugar that provides the body's main source of energy. Certain hormones (insulin and glucagon) control the level of glucose in the blood. Insulin lowers blood glucose, and glucagon raises blood glucose. Hypoglycemia can result from having too much insulin in the bloodstream, or from not eating enough food that contains  glucose. You may also have reactive hypoglycemia, which happens within 4 hours after eating a meal. What are the causes? Hypoglycemia occurs most often in people who have diabetes and may be caused by:  Diabetes medicine.  Not eating enough, or not eating often enough.  Increased physical activity.  Drinking alcohol on an empty stomach. If you do not have diabetes, hypoglycemia may be caused by:  A tumor in the pancreas.  Not eating enough, or not eating for long periods at a time (fasting).  A severe infection or illness.  Certain medicines. What increases the risk? Hypoglycemia is more likely to develop in:  People who have diabetes and take medicines to lower blood glucose.  People who abuse alcohol.  People who have a severe illness. What are the signs or symptoms? Mild symptoms Mild hypoglycemia may not cause any symptoms. If you do have symptoms, they may include:  Hunger.  Anxiety.  Sweating and feeling clammy.  Dizziness or feeling light-headed.  Sleepiness.  Nausea.  Increased heart rate.  Headache.  Blurry vision.  Irritability.  Tingling or numbness around the mouth, lips, or tongue.  A change in coordination.  Restless sleep. Moderate symptoms Moderate hypoglycemia can cause:  Mental confusion and poor judgment.  Behavior changes.  Weakness.  Irregular heartbeat. Severe symptoms Severe hypoglycemia is a medical emergency. It can cause:  Fainting.  Seizures.  Loss of consciousness (coma).  Death. How is this diagnosed? Hypoglycemia is diagnosed with a blood test to measure your blood glucose level. This blood test is done while you are having symptoms. Your health care provider may also do a physical exam and review your medical history. How is this treated? This condition can often be treated by immediately eating or drinking something that contains sugar, such as:  Fruit juice, 4-6 oz (120-150 mL).  Regular soda (not  diet soda), 4-6 oz (120-150 mL).  Low-fat milk, 4 oz (120 mL).  Several pieces of hard candy.  Sugar or honey, 1 Tbsp (15 mL). Treating hypoglycemia if you have diabetes If you are alert and able to swallow safely, follow the 15:15 rule:  Take 15 grams of a rapid-acting carbohydrate. Talk with your health care provider about how much you should take.  Rapid-acting options include: ? Glucose pills (take 15 grams). ? 6-8 pieces of hard candy. ? 4-6 oz (120-150 mL) of fruit juice. ? 4-6 oz (120-150 mL) of regular (not diet) soda. ? 1 Tbsp (15 mL) honey or sugar.  Check your blood glucose 15 minutes after you take the carbohydrate.  If the repeat blood glucose level is still at or below 70 mg/dL (3.9 mmol/L), take 15 grams of a carbohydrate again.  If your blood glucose level does not increase above 70 mg/dL (3.9 mmol/L) after 3 tries, seek emergency medical care.  After your blood glucose level returns to normal, eat a meal or a snack within 1 hour.  Treating severe hypoglycemia Severe hypoglycemia is when your blood glucose level is at or below 54 mg/dL (3 mmol/L). Severe hypoglycemia is a medical emergency. Get medical help right away. If you have severe hypoglycemia and you cannot eat or drink, you may need an injection of glucagon. A family member or close friend should learn how to check your blood glucose and how to give you a glucagon injection. Ask your health care provider if you need to have an emergency glucagon injection kit available. Severe hypoglycemia may need to be treated in a hospital. The treatment may include getting glucose through an IV. You may also need treatment for the cause of your hypoglycemia. Follow these instructions at home:  General instructions  Take over-the-counter and prescription medicines only as told by your health care provider.  Monitor your blood glucose as told by your health care provider.  Limit alcohol intake to no more than 1 drink  a day for nonpregnant women and 2 drinks a day for men. One drink equals 12 oz of beer (355 mL), 5 oz of wine (148 mL), or 1 oz of hard liquor (44 mL).  Keep all follow-up visits as told by your health care provider. This is important. If you have diabetes:  Always have a rapid-acting carbohydrate snack with you to treat low blood glucose.  Follow your diabetes management plan as directed. Make sure you: ? Know the symptoms of hypoglycemia. It is important to treat it right away to prevent it from becoming severe. ? Take your medicines as directed. ? Follow your exercise plan. ? Follow your meal plan. Eat on time, and do not skip meals. ? Check your blood glucose as often as directed. Always check before and after exercise. ? Follow your sick day plan whenever you cannot eat or drink normally. Make this plan in advance with your health care provider.  Share your diabetes management plan with people in your workplace, school, and household.  Check your urine for ketones when you are ill and as told by your health care provider.  Carry a medical alert card or wear medical alert jewelry. Contact a health care provider if:  You have problems keeping your blood glucose in your target range.  You have frequent episodes of hypoglycemia. Get help right away if:  You continue to have hypoglycemia symptoms after eating or drinking something containing glucose.  Your blood glucose is at or below 54 mg/dL (3 mmol/L).  You have a seizure.  You faint. These symptoms may represent a serious problem that is an emergency. Do not wait to see if the symptoms will go away. Get medical help right away. Call your local emergency services (911 in the [place] Summary  Hypoglycemia occurs when the level of sugar (glucose) in the blood is too low.  Hypoglycemia can happen in people who do or do not have diabetes. It can develop quickly, and it can be a medical emergency.  Make sure you know the  symptoms of hypoglycemia and how to treat it.  Always have a rapid-acting carbohydrate snack with you to treat low blood sugar. This information is not intended to replace advice given to you by your health care provider. Make sure you discuss any questions you have with your health  care provider. Document Revised: 03/29/2018 Document Reviewed: 11/09/2015 Elsevier Patient Education  [place].   Pneumococcal Polysaccharide Vaccine (PPSV23): What You Need to Know 1. Why get vaccinated? Pneumococcal polysaccharide vaccine (PPSV23) can prevent pneumococcal disease. Pneumococcal disease refers to any illness caused by pneumococcal bacteria. These bacteria can cause many types of illnesses, including pneumonia, which is an infection of the lungs. Pneumococcal bacteria are one of the most common causes of pneumonia. Besides pneumonia, pneumococcal bacteria can also cause:  Ear infections  Sinus infections  Meningitis (infection of the tissue covering the brain and spinal cord)  Bacteremia (bloodstream infection) Anyone can get pneumococcal disease, but children under 40 years of age, people with certain medical conditions, adults 25 years or older, and cigarette smokers are at the highest risk. Most pneumococcal infections are mild. However, some can result in long-term problems, such as brain damage or hearing loss. Meningitis, bacteremia, and pneumonia caused by pneumococcal disease can be fatal. 2. PPSV23 PPSV23 protects against 23 types of bacteria that cause pneumococcal disease. PPSV23 is recommended for:  All adults 90 years or older,  Anyone 2 years or older with certain medical conditions that can lead to an increased risk for pneumococcal disease. Most people need only one dose of PPSV23. A second dose of PPSV23, and another type of pneumococcal vaccine called PCV13, are recommended for certain high-risk groups. Your health care provider can give you more  information. People 65 years or older should get a dose of PPSV23 even if they have already gotten one or more doses of the vaccine before they turned 64. 3. Talk with your health care provider Tell your vaccine provider if the person getting the vaccine:  Has had an allergic reaction after a previous dose of PPSV23, or has any severe, life-threatening allergies. In some cases, your health care provider may decide to postpone PPSV23 vaccination to a future visit. People with minor illnesses, such as a cold, may be vaccinated. People who are moderately or severely ill should usually wait until they recover before getting PPSV23. Your health care provider can give you more information. 4. Risks of a vaccine reaction  Redness or pain where the shot is given, feeling tired, fever, or muscle aches can happen after PPSV23. People sometimes faint after medical procedures, including vaccination. Tell your provider if you feel dizzy or have vision changes or ringing in the ears. As with any medicine, there is a very remote chance of a vaccine causing a severe allergic reaction, other serious injury, or death. 5. What if there is a serious problem? An allergic reaction could occur after the vaccinated person leaves the clinic. If you see signs of a severe allergic reaction (hives, swelling of the face and throat, difficulty breathing, a fast heartbeat, dizziness, or weakness), call 9-1-1 and get the person to the nearest hospital. For other signs that concern you, call your health care provider. Adverse reactions should be reported to the Vaccine Adverse Event Reporting System (VAERS). Your health care provider will usually file this report, or you can do it yourself. Visit the VAERS website at www.vaers.SamedayNews.es or call (209) 083-4225. VAERS is only for reporting reactions, and VAERS staff do not give medical advice. 6. How can I learn more?  Ask your health care provider.  Call your local or state  health department.  Contact the Centers for Disease Control and Prevention (CDC): ? Call 863-676-9118 (1-800-CDC-INFO) or ? Visit CDC's website at http://hunter.com/ CDC Vaccine Information Statement PPSV23 Vaccine (08/18/2018) This  information is not intended to replace advice given to you by your health care provider. Make sure you discuss any questions you have with your health care provider. Document Revised: 01/25/2019 Document Reviewed: 05/18/2018 Elsevier Patient Education  [place].   Recombinant Zoster (Shingles) Vaccine: What You Need to Know 1. Why get vaccinated? Recombinant zoster (shingles) vaccine can prevent shingles. Shingles (also called herpes zoster, or just zoster) is a painful skin rash, usually with blisters. In addition to the rash, shingles can cause fever, headache, chills, or upset stomach. More rarely, shingles can lead to pneumonia, hearing problems, blindness, brain inflammation (encephalitis), or death. The most common complication of shingles is long-term nerve pain called postherpetic neuralgia (PHN). PHN occurs in the areas where the shingles rash was, even after the rash clears up. It can last for months or years after the rash goes away. The pain from PHN can be severe and debilitating. About 10 to 18% of people who get shingles will experience PHN. The risk of PHN increases with age. An older adult with shingles is more likely to develop PHN and have longer lasting and more severe pain than a younger person with shingles. Shingles is caused by the varicella zoster virus, the same virus that causes chickenpox. After you have chickenpox, the virus stays in your body and can cause shingles later in life. Shingles cannot be passed from one person to another, but the virus that causes shingles can spread and cause chickenpox in someone who had never had chickenpox or received chickenpox vaccine. 2. Recombinant shingles vaccine Recombinant shingles  vaccine provides strong protection against shingles. By preventing shingles, recombinant shingles vaccine also protects against PHN. Recombinant shingles vaccine is the preferred vaccine for the prevention of shingles. However, a different vaccine, live shingles vaccine, may be used in some circumstances. The recombinant shingles vaccine is recommended for adults 50 years and older without serious immune problems. It is given as a two-dose series. This vaccine is also recommended for people who have already gotten another type of shingles vaccine, the live shingles vaccine. There is no live virus in this vaccine. Shingles vaccine may be given at the same time as other vaccines. 3. Talk with your health care provider Tell your vaccine provider if the person getting the vaccine:  Has had an allergic reaction after a previous dose of recombinant shingles vaccine, or has any severe, life-threatening allergies.  Is pregnant or breastfeeding.  Is currently experiencing an episode of shingles. In some cases, your health care provider may decide to postpone shingles vaccination to a future visit. People with minor illnesses, such as a cold, may be vaccinated. People who are moderately or severely ill should usually wait until they recover before getting recombinant shingles vaccine. Your health care provider can give you more information. 4. Risks of a vaccine reaction  A sore arm with mild or moderate pain is very common after recombinant shingles vaccine, affecting about 80% of vaccinated people. Redness and swelling can also happen at the site of the injection.  Tiredness, muscle pain, headache, shivering, fever, stomach pain, and nausea happen after vaccination in more than half of people who receive recombinant shingles vaccine. In clinical trials, about 1 out of 6 people who got recombinant zoster vaccine experienced side effects that prevented them from doing regular activities. Symptoms usually  went away on their own in 2 to 3 days. You should still get the second dose of recombinant zoster vaccine even if you had one of  these reactions after the first dose. People sometimes faint after medical procedures, including vaccination. Tell your provider if you feel dizzy or have vision changes or ringing in the ears. As with any medicine, there is a very remote chance of a vaccine causing a severe allergic reaction, other serious injury, or death. 5. What if there is a serious problem? An allergic reaction could occur after the vaccinated person leaves the clinic. If you see signs of a severe allergic reaction (hives, swelling of the face and throat, difficulty breathing, a fast heartbeat, dizziness, or weakness), call 9-1-1 and get the person to the nearest hospital. For other signs that concern you, call your health care provider. Adverse reactions should be reported to the Vaccine Adverse Event Reporting System (VAERS). Your health care provider will usually file this report, or you can do it yourself. Visit the VAERS website at www.vaers.SamedayNews.es or call 684 500 4441. VAERS is only for reporting reactions, and VAERS staff do not give medical advice. 6. How can I learn more?  Ask your health care provider.  Call your local or state health department.  Contact the Centers for Disease Control and Prevention (CDC): ? Call 509-455-5313 (1-800-CDC-INFO) or ? Visit CDC's website at http://hunter.com/ Vaccine Information Statement Recombinant Zoster Vaccine (08/18/2018) This information is not intended to replace advice given to you by your health care provider. Make sure you discuss any questions you have with your health care provider. Document Revised: 01/25/2019 Document Reviewed: 05/12/2018 Elsevier Patient Education  [place].

## 2020-07-31 DIAGNOSIS — M5414 Radiculopathy, thoracic region: Secondary | ICD-10-CM | POA: Diagnosis not present

## 2020-07-31 DIAGNOSIS — M9901 Segmental and somatic dysfunction of cervical region: Secondary | ICD-10-CM | POA: Diagnosis not present

## 2020-07-31 DIAGNOSIS — M9902 Segmental and somatic dysfunction of thoracic region: Secondary | ICD-10-CM | POA: Diagnosis not present

## 2020-07-31 DIAGNOSIS — R519 Headache, unspecified: Secondary | ICD-10-CM | POA: Diagnosis not present

## 2020-08-15 ENCOUNTER — Other Ambulatory Visit: Payer: Self-pay

## 2020-08-15 ENCOUNTER — Other Ambulatory Visit (INDEPENDENT_AMBULATORY_CARE_PROVIDER_SITE_OTHER): Payer: BC Managed Care – PPO

## 2020-08-15 DIAGNOSIS — E1159 Type 2 diabetes mellitus with other circulatory complications: Secondary | ICD-10-CM

## 2020-08-15 DIAGNOSIS — I152 Hypertension secondary to endocrine disorders: Secondary | ICD-10-CM | POA: Diagnosis not present

## 2020-08-15 DIAGNOSIS — Z13818 Encounter for screening for other digestive system disorders: Secondary | ICD-10-CM | POA: Diagnosis not present

## 2020-08-15 LAB — COMPREHENSIVE METABOLIC PANEL
ALT: 27 U/L (ref 0–35)
AST: 18 U/L (ref 0–37)
Albumin: 4.4 g/dL (ref 3.5–5.2)
Alkaline Phosphatase: 46 U/L (ref 39–117)
BUN: 22 mg/dL (ref 6–23)
CO2: 32 mEq/L (ref 19–32)
Calcium: 9.9 mg/dL (ref 8.4–10.5)
Chloride: 99 mEq/L (ref 96–112)
Creatinine, Ser: 0.87 mg/dL (ref 0.40–1.20)
GFR: 76.87 mL/min (ref >60.00)
Glucose, Bld: 123 mg/dL — ABNORMAL HIGH (ref 70–99)
Potassium: 3.7 mEq/L (ref 3.5–5.1)
Sodium: 139 mEq/L (ref 135–145)
Total Bilirubin: 0.4 mg/dL (ref 0.2–1.2)
Total Protein: 6.8 g/dL (ref 6.0–8.3)

## 2020-08-15 LAB — CBC WITH DIFFERENTIAL/PLATELET
Basophils Absolute: 0 10*3/uL (ref 0.0–0.1)
Basophils Relative: 0.3 % (ref 0.0–3.0)
Eosinophils Absolute: 0.1 10*3/uL (ref 0.0–0.7)
Eosinophils Relative: 1.7 % (ref 0.0–5.0)
HCT: 39.3 % (ref 36.0–46.0)
Hemoglobin: 13.4 g/dL (ref 12.0–15.0)
Lymphocytes Relative: 20.5 % (ref 12.0–46.0)
Lymphs Abs: 1.1 10*3/uL (ref 0.7–4.0)
MCHC: 34.1 g/dL (ref 30.0–36.0)
MCV: 88.5 fl (ref 78.0–100.0)
Monocytes Absolute: 0.3 10*3/uL (ref 0.1–1.0)
Monocytes Relative: 6.2 % (ref 3.0–12.0)
Neutro Abs: 3.9 10*3/uL (ref 1.4–7.7)
Neutrophils Relative %: 71.3 % (ref 43.0–77.0)
Platelets: 326 10*3/uL (ref 150.0–400.0)
RBC: 4.44 Mil/uL (ref 3.87–5.11)
RDW: 12.9 % (ref 11.5–15.5)
WBC: 5.5 10*3/uL (ref 4.0–10.5)

## 2020-08-15 LAB — LIPID PANEL
Cholesterol: 127 mg/dL (ref 0–200)
HDL: 36.9 mg/dL — ABNORMAL LOW (ref >39.00)
LDL Cholesterol: 54 mg/dL (ref 0–99)
NonHDL: 89.61
Total CHOL/HDL Ratio: 3
Triglycerides: 179 mg/dL — ABNORMAL HIGH (ref 0.0–149.0)
VLDL: 35.8 mg/dL (ref 0.0–40.0)

## 2020-08-15 LAB — HEMOGLOBIN A1C: Hgb A1c MFr Bld: 6.1 % (ref 4.6–6.5)

## 2020-08-16 ENCOUNTER — Encounter: Payer: Self-pay | Admitting: Internal Medicine

## 2020-08-16 ENCOUNTER — Other Ambulatory Visit: Payer: Self-pay | Admitting: Internal Medicine

## 2020-08-16 LAB — HEPATITIS C ANTIBODY
Hepatitis C Ab: NONREACTIVE
SIGNAL TO CUT-OFF: 0.01 (ref <1.00)

## 2020-08-22 DIAGNOSIS — H15111 Episcleritis periodica fugax, right eye: Secondary | ICD-10-CM | POA: Diagnosis not present

## 2020-08-22 LAB — HM DIABETES EYE EXAM

## 2020-08-23 ENCOUNTER — Encounter: Payer: Self-pay | Admitting: Internal Medicine

## 2020-08-24 ENCOUNTER — Encounter: Payer: Self-pay | Admitting: Internal Medicine

## 2020-08-29 DIAGNOSIS — R519 Headache, unspecified: Secondary | ICD-10-CM | POA: Diagnosis not present

## 2020-08-29 DIAGNOSIS — M9902 Segmental and somatic dysfunction of thoracic region: Secondary | ICD-10-CM | POA: Diagnosis not present

## 2020-08-29 DIAGNOSIS — M9901 Segmental and somatic dysfunction of cervical region: Secondary | ICD-10-CM | POA: Diagnosis not present

## 2020-08-29 DIAGNOSIS — M5414 Radiculopathy, thoracic region: Secondary | ICD-10-CM | POA: Diagnosis not present

## 2020-09-26 DIAGNOSIS — M9901 Segmental and somatic dysfunction of cervical region: Secondary | ICD-10-CM | POA: Diagnosis not present

## 2020-09-26 DIAGNOSIS — M9902 Segmental and somatic dysfunction of thoracic region: Secondary | ICD-10-CM | POA: Diagnosis not present

## 2020-09-26 DIAGNOSIS — M5414 Radiculopathy, thoracic region: Secondary | ICD-10-CM | POA: Diagnosis not present

## 2020-09-26 DIAGNOSIS — R519 Headache, unspecified: Secondary | ICD-10-CM | POA: Diagnosis not present

## 2020-10-30 DIAGNOSIS — R519 Headache, unspecified: Secondary | ICD-10-CM | POA: Diagnosis not present

## 2020-10-30 DIAGNOSIS — M9902 Segmental and somatic dysfunction of thoracic region: Secondary | ICD-10-CM | POA: Diagnosis not present

## 2020-10-30 DIAGNOSIS — M5414 Radiculopathy, thoracic region: Secondary | ICD-10-CM | POA: Diagnosis not present

## 2020-10-30 DIAGNOSIS — M9901 Segmental and somatic dysfunction of cervical region: Secondary | ICD-10-CM | POA: Diagnosis not present

## 2020-11-01 ENCOUNTER — Encounter: Payer: Self-pay | Admitting: Internal Medicine

## 2020-11-01 ENCOUNTER — Telehealth: Payer: Self-pay | Admitting: Internal Medicine

## 2020-11-01 ENCOUNTER — Other Ambulatory Visit: Payer: Self-pay | Admitting: Internal Medicine

## 2020-11-01 DIAGNOSIS — U071 COVID-19: Secondary | ICD-10-CM

## 2020-11-01 NOTE — Telephone Encounter (Signed)
No vaccine and risk factors Please schedule PCR test  Will discuss MAB infusion at appt may want to refer based on symptoms

## 2020-11-01 NOTE — Telephone Encounter (Signed)
Spoke with Patient and have her scheduled for tomorrow 1/14 at 11 30 to be seen virtually.

## 2020-11-02 ENCOUNTER — Other Ambulatory Visit: Payer: Self-pay

## 2020-11-02 ENCOUNTER — Encounter: Payer: Self-pay | Admitting: Internal Medicine

## 2020-11-02 ENCOUNTER — Telehealth (INDEPENDENT_AMBULATORY_CARE_PROVIDER_SITE_OTHER): Payer: BC Managed Care – PPO | Admitting: Internal Medicine

## 2020-11-02 VITALS — BP 110/77 | HR 99 | Ht 60.0 in | Wt 154.0 lb

## 2020-11-02 DIAGNOSIS — J40 Bronchitis, not specified as acute or chronic: Secondary | ICD-10-CM | POA: Diagnosis not present

## 2020-11-02 DIAGNOSIS — U071 COVID-19: Secondary | ICD-10-CM

## 2020-11-02 MED ORDER — DEXAMETHASONE 6 MG PO TABS: 6.0000 mg | ORAL_TABLET | Freq: Every day | ORAL | 0 refills | Status: DC

## 2020-11-02 MED ORDER — AZITHROMYCIN 250 MG PO TABS: ORAL_TABLET | ORAL | 0 refills | Status: DC

## 2020-11-02 MED ORDER — ALBUTEROL SULFATE HFA 108 (90 BASE) MCG/ACT IN AERS: 1.0000 | INHALATION_SPRAY | RESPIRATORY_TRACT | 0 refills | Status: DC | PRN

## 2020-11-02 MED ORDER — HYDROCOD POLST-CPM POLST ER 10-8 MG/5ML PO SUER: 5.0000 mL | Freq: Every evening | ORAL | 0 refills | Status: DC | PRN

## 2020-11-02 NOTE — Progress Notes (Signed)
Patient tested positive with the at home test. Patient does not know how she got Covid.   Symptoms include headache, sore throat, fever, runny nose, body aches, and stomach upset.  Onset was Tuesday.

## 2020-11-02 NOTE — Telephone Encounter (Signed)
Patient seen virtually today

## 2020-11-02 NOTE — Progress Notes (Signed)
telephone Note  I connected with Angela Simon  on 11/02/20 at 12:00 PM EST by telephone and verified that I am speaking with the correct person using two identifiers.  Location patient: home, [place] Location provider:work or home office Persons participating in the virtual visit: patient, provider  I discussed the limitations of evaluation and management by telemedicine and the availability of in person appointments. The patient expressed understanding and agreed to proceed.   HPI: 1. covid + 11/01/20 home test with cough deep cough, scratchy throat, sinus pain and pressure and h/o tried vitamins C,D,zinc, Mucinex Tylenol Ibuprofen last night temp 100.1 today 98.4 had myalgia denies sob O2 on apple watch 92% appetite ok but has nausea  Has prn zofran if needed   ROS: See pertinent positives and negatives per HPI.  Past Medical History:  Diagnosis Date  . Allergy   . COVID-19    11/01/20  . Essential hypertension   . Fatty liver   . GERD (gastroesophageal reflux disease)   . Gestational diabetes   . Hyperlipidemia   . IBS (irritable bowel syndrome)   . Kidney stones    x 3 occurrences   . Melanoma ([place])   . Obesity (BMI 30-39.9)   . Renal disorder   . Shingles     Past Surgical History:  Procedure Laterality Date  . BREAST REDUCTION SURGERY    . CESAREAN SECTION    . LITHOTRIPSY     x1  . MELANOMA EXCISION     Right arm  . NASAL SINUS SURGERY    . TONSILLECTOMY       Current Outpatient Medications:  .  albuterol (VENTOLIN HFA) 108 (90 Base) MCG/ACT inhaler, Inhale 1-2 puffs into the lungs every 4 (four) hours as needed for wheezing or shortness of breath., Disp: 18 g, Rfl: 0 .  azithromycin (ZITHROMAX) 250 MG tablet, 2 pills day 1 and 1 day 2-5 with food, Disp: 6 tablet, Rfl: 0 .  bifidobacterium infantis (ALIGN) capsule, Take by mouth., Disp: , Rfl:  .  cetirizine (ZYRTEC) 10 MG tablet, Take 10 mg by mouth daily., Disp: , Rfl:  .  chlorpheniramine-HYDROcodone  (TUSSIONEX PENNKINETIC ER) 10-8 MG/5ML SUER, Take 5 mLs by mouth at bedtime as needed., Disp: 115 mL, Rfl: 0 .  dexamethasone (DECADRON) 6 MG tablet, Take 1 tablet (6 mg total) by mouth daily. X 5-10 days with food, Disp: 10 tablet, Rfl: 0 .  estradiol (VIVELLE-DOT) 0.0375 MG/24HR, Place onto the skin., Disp: , Rfl:  .  FLUoxetine (PROZAC) 20 MG tablet, Take 20 mg by mouth daily., Disp: , Rfl:  .  glipiZIDE (GLUCOTROL XL) 2.5 MG 24 hr tablet, Take 1 tablet (2.5 mg total) by mouth daily with breakfast., Disp: 90 tablet, Rfl: 3 .  losartan-hydrochlorothiazide (HYZAAR) 100-25 MG tablet, Take 1 tablet by mouth daily. In am, Disp: 90 tablet, Rfl: 3 .  pantoprazole (PROTONIX) 40 MG tablet, Take 40 mg by mouth daily., Disp: , Rfl:  .  pravastatin (PRAVACHOL) 20 MG tablet, Take 1 tablet (20 mg total) by mouth at bedtime., Disp: 90 tablet, Rfl: 3 .  LORazepam (ATIVAN) 0.5 MG tablet, Take 1 tablet (0.5 mg total) by mouth at bedtime as needed for anxiety. (Patient not taking: Reported on 11/02/2020), Disp: 30 tablet, Rfl: 5 .  Multiple Vitamin (MULTIVITAMIN) tablet, Take 1 tablet by mouth daily. (Patient not taking: Reported on 11/02/2020), Disp: , Rfl:  .  Omega-3 1000 MG CAPS, Take by mouth. (Patient not taking: Reported on 11/02/2020),  Disp: , Rfl:  .  ondansetron (ZOFRAN) 4 MG tablet, Take 1 tablet (4 mg total) by mouth every 8 (eight) hours as needed for nausea or vomiting. (Patient not taking: Reported on 11/02/2020), Disp: 40 tablet, Rfl: 0  EXAM:  VITALS per patient if applicable:  GENERAL: alert, oriented, appears well and in no acute distress  LUNGS: on inspection no signs of respiratory distress, breathing rate appears normal, no obvious gross SOB, gasping or wheezing +cough on exam   PSYCH/NEURO: pleasant and cooperative, no obvious depression or anxiety, speech and thought processing grossly intact  ASSESSMENT AND PLAN:  Discussed the following assessment and plan:  COVID-19 - Plan:  albuterol (VENTOLIN HFA) 108 (90 Base) MCG/ACT inhaler, azithromycin (ZITHROMAX) 250 MG tablet, chlorpheniramine-HYDROcodone (TUSSIONEX PENNKINETIC ER) 10-8 MG/5ML SUER, dexamethasone (DECADRON) 6 MG tablet  Bronchitis due to COVID-19 virus - Plan: albuterol (VENTOLIN HFA) 108 (90 Base) MCG/ACT inhaler, azithromycin (ZITHROMAX) 250 MG tablet, chlorpheniramine-HYDROcodone (TUSSIONEX PENNKINETIC ER) 10-8 MG/5ML SUER, dexamethasone (DECADRON) 6 MG tablet There is no medication other than over the counter meds: Mucinex dm green label for cough. Vitamin C 1000 mg daily. Vitamin D3 4000 Iu (units) daily. Zinc 100 mg daily. Quercetin 250-500 mg 2 times per day  Monitor pulse ox, buy from [facility] if oxygen is less than 90 please go to the hospital.     Are you feeling really sick? Shortness of breath, cough, chest pain? If so let me know  If worsening, go to hospital. -we discussed possible serious and likely etiologies, options for evaluation and workup, limitations of telemedicine visit vs in person visit, treatment, treatment risks and precautions.   I discussed the assessment and treatment plan with the patient. The patient was provided an opportunity to ask questions and all were answered. The patient agreed with the plan and demonstrated an understanding of the instructions.    Time spent 20 min Delorise Jackson, MD

## 2020-11-12 ENCOUNTER — Encounter: Payer: Self-pay | Admitting: Internal Medicine

## 2020-11-14 DIAGNOSIS — R519 Headache, unspecified: Secondary | ICD-10-CM | POA: Diagnosis not present

## 2020-11-14 DIAGNOSIS — M5414 Radiculopathy, thoracic region: Secondary | ICD-10-CM | POA: Diagnosis not present

## 2020-11-14 DIAGNOSIS — M9901 Segmental and somatic dysfunction of cervical region: Secondary | ICD-10-CM | POA: Diagnosis not present

## 2020-11-14 DIAGNOSIS — M9902 Segmental and somatic dysfunction of thoracic region: Secondary | ICD-10-CM | POA: Diagnosis not present

## 2020-11-23 ENCOUNTER — Other Ambulatory Visit: Payer: Self-pay

## 2020-11-23 DIAGNOSIS — I10 Essential (primary) hypertension: Secondary | ICD-10-CM

## 2020-11-23 MED ORDER — LOSARTAN POTASSIUM-HCTZ 100-25 MG PO TABS: 1.0000 | ORAL_TABLET | Freq: Every day | ORAL | 3 refills | Status: DC

## 2020-11-28 ENCOUNTER — Encounter: Payer: Self-pay | Admitting: Internal Medicine

## 2020-12-10 ENCOUNTER — Other Ambulatory Visit: Payer: Self-pay | Admitting: Internal Medicine

## 2020-12-10 DIAGNOSIS — E785 Hyperlipidemia, unspecified: Secondary | ICD-10-CM

## 2020-12-12 ENCOUNTER — Encounter: Payer: Self-pay | Admitting: Internal Medicine

## 2020-12-12 DIAGNOSIS — M7502 Adhesive capsulitis of left shoulder: Secondary | ICD-10-CM | POA: Diagnosis not present

## 2020-12-19 DIAGNOSIS — M9901 Segmental and somatic dysfunction of cervical region: Secondary | ICD-10-CM | POA: Diagnosis not present

## 2020-12-19 DIAGNOSIS — M9902 Segmental and somatic dysfunction of thoracic region: Secondary | ICD-10-CM | POA: Diagnosis not present

## 2020-12-19 DIAGNOSIS — M5414 Radiculopathy, thoracic region: Secondary | ICD-10-CM | POA: Diagnosis not present

## 2020-12-19 DIAGNOSIS — R519 Headache, unspecified: Secondary | ICD-10-CM | POA: Diagnosis not present

## 2021-01-02 DIAGNOSIS — M7502 Adhesive capsulitis of left shoulder: Secondary | ICD-10-CM | POA: Diagnosis not present

## 2021-01-22 ENCOUNTER — Encounter: Payer: Self-pay | Admitting: Internal Medicine

## 2021-01-22 DIAGNOSIS — F419 Anxiety disorder, unspecified: Secondary | ICD-10-CM

## 2021-01-22 DIAGNOSIS — M25612 Stiffness of left shoulder, not elsewhere classified: Secondary | ICD-10-CM | POA: Diagnosis not present

## 2021-01-23 DIAGNOSIS — M5414 Radiculopathy, thoracic region: Secondary | ICD-10-CM | POA: Diagnosis not present

## 2021-01-23 DIAGNOSIS — M9902 Segmental and somatic dysfunction of thoracic region: Secondary | ICD-10-CM | POA: Diagnosis not present

## 2021-01-23 DIAGNOSIS — M9901 Segmental and somatic dysfunction of cervical region: Secondary | ICD-10-CM | POA: Diagnosis not present

## 2021-01-23 DIAGNOSIS — R519 Headache, unspecified: Secondary | ICD-10-CM | POA: Diagnosis not present

## 2021-01-24 ENCOUNTER — Other Ambulatory Visit: Payer: Self-pay | Admitting: Internal Medicine

## 2021-01-24 DIAGNOSIS — F419 Anxiety disorder, unspecified: Secondary | ICD-10-CM

## 2021-01-24 DIAGNOSIS — R110 Nausea: Secondary | ICD-10-CM

## 2021-01-24 MED ORDER — ONDANSETRON HCL 4 MG PO TABS: 4.0000 mg | ORAL_TABLET | Freq: Three times a day (TID) | ORAL | 0 refills | Status: DC | PRN

## 2021-01-24 MED ORDER — LORAZEPAM 0.5 MG PO TABS: 0.5000 mg | ORAL_TABLET | Freq: Every evening | ORAL | 5 refills | Status: DC | PRN

## 2021-01-28 MED ORDER — LORAZEPAM 0.5 MG PO TABS: 0.5000 mg | ORAL_TABLET | Freq: Every evening | ORAL | 0 refills | Status: DC | PRN

## 2021-01-28 NOTE — Telephone Encounter (Signed)
Pended for your approval or denial.  Medication was sent in 01/24/21 however this did not go through to the pharmacy.

## 2021-01-29 ENCOUNTER — Ambulatory Visit: Payer: BC Managed Care – PPO | Admitting: Internal Medicine

## 2021-02-05 ENCOUNTER — Encounter: Payer: Self-pay | Admitting: Internal Medicine

## 2021-02-05 ENCOUNTER — Other Ambulatory Visit: Payer: Self-pay

## 2021-02-05 ENCOUNTER — Ambulatory Visit (INDEPENDENT_AMBULATORY_CARE_PROVIDER_SITE_OTHER): Payer: BC Managed Care – PPO | Admitting: Internal Medicine

## 2021-02-05 VITALS — BP 124/82 | Temp 98.1°F | Ht 60.0 in | Wt 160.2 lb

## 2021-02-05 DIAGNOSIS — K3184 Gastroparesis: Secondary | ICD-10-CM

## 2021-02-05 DIAGNOSIS — M7502 Adhesive capsulitis of left shoulder: Secondary | ICD-10-CM

## 2021-02-05 DIAGNOSIS — Z0000 Encounter for general adult medical examination without abnormal findings: Secondary | ICD-10-CM

## 2021-02-05 DIAGNOSIS — E1159 Type 2 diabetes mellitus with other circulatory complications: Secondary | ICD-10-CM | POA: Diagnosis not present

## 2021-02-05 DIAGNOSIS — E785 Hyperlipidemia, unspecified: Secondary | ICD-10-CM

## 2021-02-05 DIAGNOSIS — Z1231 Encounter for screening mammogram for malignant neoplasm of breast: Secondary | ICD-10-CM

## 2021-02-05 DIAGNOSIS — I152 Hypertension secondary to endocrine disorders: Secondary | ICD-10-CM

## 2021-02-05 DIAGNOSIS — Z1389 Encounter for screening for other disorder: Secondary | ICD-10-CM

## 2021-02-05 MED ORDER — GLIPIZIDE ER 2.5 MG PO TB24: 2.5000 mg | ORAL_TABLET | Freq: Every day | ORAL | 3 refills | Status: DC

## 2021-02-05 MED ORDER — FLUOXETINE HCL 20 MG PO TABS: 20.0000 mg | ORAL_TABLET | Freq: Every day | ORAL | 3 refills | Status: DC

## 2021-02-05 MED ORDER — PRAVASTATIN SODIUM 20 MG PO TABS: 20.0000 mg | ORAL_TABLET | Freq: Every day | ORAL | 3 refills | Status: DC

## 2021-02-05 NOTE — Patient Instructions (Addendum)
Physicians Dr. Linda Hedges, Dr. Kathryne Eriksson   Mammogram due 05/17/21 [facility]    Consider Tdap vaccine call back please   Tdap (Tetanus, Diphtheria, Pertussis) Vaccine: What You Need to Know 1. Why get vaccinated? Tdap vaccine can prevent tetanus, diphtheria, and pertussis. Diphtheria and pertussis spread from person to person. Tetanus enters the body through cuts or wounds.  TETANUS (T) causes painful stiffening of the muscles. Tetanus can lead to serious health problems, including being unable to open the mouth, having trouble swallowing and breathing, or death.  DIPHTHERIA (D) can lead to difficulty breathing, heart failure, paralysis, or death.  PERTUSSIS (aP), also known as "whooping cough," can cause uncontrollable, violent coughing that makes it hard to breathe, eat, or drink. Pertussis can be extremely serious especially in babies and young children, causing pneumonia, convulsions, brain damage, or death. In teens and adults, it can cause weight loss, loss of bladder control, passing out, and rib fractures from severe coughing. 2. Tdap vaccine Tdap is only for children 7 years and older, adolescents, and adults.  Adolescents should receive a single dose of Tdap, preferably at age 46 or 11 years. Pregnant people should get a dose of Tdap during every pregnancy, preferably during the early part of the third trimester, to help protect the newborn from pertussis. Infants are most at risk for severe, life-threatening complications from pertussis. Adults who have never received Tdap should get a dose of Tdap. Also, adults should receive a booster dose of either Tdap or Td (a different vaccine that protects against tetanus and diphtheria but not pertussis) every 10 years, or after 5 years in the case of a severe or dirty wound or burn. Tdap may be given at the same time as other vaccines. 3. Talk with your health care provider Tell your vaccine provider if the person getting the  vaccine:  Has had an allergic reaction after a previous dose of any vaccine that protects against tetanus, diphtheria, or pertussis, or has any severe, life-threatening allergies  Has had a coma, decreased level of consciousness, or prolonged seizures within 7 days after a previous dose of any pertussis vaccine (DTP, DTaP, or Tdap)  Has seizures or another nervous system problem  Has ever had Guillain-Barr Syndrome (also called "GBS")  Has had severe pain or swelling after a previous dose of any vaccine that protects against tetanus or diphtheria In some cases, your health care provider may decide to postpone Tdap vaccination until a future visit. People with minor illnesses, such as a cold, may be vaccinated. People who are moderately or severely ill should usually wait until they recover before getting Tdap vaccine.  Your health care provider can give you more information. 4. Risks of a vaccine reaction  Pain, redness, or swelling where the shot was given, mild fever, headache, feeling tired, and nausea, vomiting, diarrhea, or stomachache sometimes happen after Tdap vaccination. People sometimes faint after medical procedures, including vaccination. Tell your provider if you feel dizzy or have vision changes or ringing in the ears.  As with any medicine, there is a very remote chance of a vaccine causing a severe allergic reaction, other serious injury, or death. 5. What if there is a serious problem? An allergic reaction could occur after the vaccinated person leaves the clinic. If you see signs of a severe allergic reaction (hives, swelling of the face and throat, difficulty breathing, a fast heartbeat, dizziness, or weakness), call 9-1-1 and get the person to the nearest hospital. For other  signs that concern you, call your health care provider.  Adverse reactions should be reported to the Vaccine Adverse Event Reporting System (VAERS). Your health care provider will usually file this  report, or you can do it yourself. Visit the VAERS website at www.vaers.SamedayNews.es or call (616) 714-5013. VAERS is only for reporting reactions, and VAERS staff members do not give medical advice. 6. The National Vaccine Injury Compensation Program The [employer] Vaccine Injury Compensation Program (VICP) is a federal program that was created to compensate people who may have been injured by certain vaccines. Claims regarding alleged injury or death due to vaccination have a time limit for filing, which may be as short as two years. Visit the VICP website at GoldCloset.com.ee or call 337-053-0300 to learn about the program and about filing a claim. 7. How can I learn more?  Ask your health care provider.  Call your local or state health department.  Visit the website of the Food and Drug Administration (FDA) for vaccine package inserts and additional information at TraderRating.uy.  Contact the Centers for Disease Control and Prevention (CDC): ? Call 9137103263 (1-800-CDC-INFO) or ? Visit CDC's website at http://hunter.com/. Vaccine Information Statement Tdap (Tetanus, Diphtheria, Pertussis) Vaccine (05/25/2020) This information is not intended to replace advice given to you by your health care provider. Make sure you discuss any questions you have with your health care provider. Document Revised: 06/20/2020 Document Reviewed: 06/20/2020 Elsevier Patient Education  2021 [employer].   Gastroparesis  Gastroparesis is a condition in which food takes longer than normal to empty from the stomach. This condition is also known as delayed gastric emptying. It is usually a long-term (chronic) condition. There is no cure, but there are treatments and things that you can do at home to help relieve symptoms. Treating the underlying condition that causes gastroparesis can also help relieve symptoms. What are the causes? In many cases, the cause of this  condition is not known. Possible causes include:  A hormone (endocrine) disorder, such as hypothyroidism or diabetes.  A nervous system disease, such as Parkinson's disease or multiple sclerosis.  Cancer, infection, or surgery that affects the stomach or vagus nerve. The vagus nerve runs from your chest, through your neck, and to the lower part of your brain.  A connective tissue disorder, such as scleroderma.  Certain medicines. What increases the risk? You are more likely to develop this condition if:  You have certain disorders or diseases. These may include: ? An endocrine disorder. ? An eating disorder. ? Amyloidosis. ? Scleroderma. ? Parkinson's disease. ? Multiple sclerosis. ? Cancer or infection of the stomach or the vagus nerve.  You have had surgery on your stomach or vagus nerve.  You take certain medicines.  You are female. What are the signs or symptoms? Symptoms of this condition include:  Feeling full after eating very little or a loss of appetite.  Nausea, vomiting, or heartburn.  Bloating of your abdomen.  Inconsistent blood sugar (glucose) levels on blood tests.  Unexplained weight loss.  Acid from the stomach coming up into the esophagus (gastroesophageal reflux).  Sudden tightening (spasm) of the stomach, which can be painful. Symptoms may come and go. Some people may not notice any symptoms. How is this diagnosed? This condition is diagnosed with tests, such as:  Tests that check how long it takes food to move through the stomach and intestines. These tests include: ? Upper gastrointestinal (GI) series. For this test, you drink a liquid that shows up well on  X-rays, and then X-rays are taken of your intestines. ? Gastric emptying scintigraphy. For this test, you eat food that contains a small amount of radioactive material, and then scans are taken. ? Wireless capsule GI monitoring system. For this test, you swallow a pill (capsule) that records  information about how foods and fluid move through your stomach.  Gastric manometry. For this test, a tube is passed down your throat and into your stomach to measure electrical and muscular activity.  Endoscopy. For this test, a long, thin tube with a camera and light on the end is passed down your throat and into your stomach to check for problems in your stomach lining.  Ultrasound. This test uses sound waves to create images of the inside of your body. This can help rule out gallbladder disease or pancreatitis as a cause of your symptoms. How is this treated? There is no cure for this condition, but treatment and home care may relieve symptoms. Treatment may include:  Treating the underlying cause.  Managing your symptoms by making changes to your diet and exercise habits.  Taking medicines to control nausea and vomiting and to stimulate stomach muscles.  Getting food through a feeding tube in the hospital. This may be done in severe cases.  Having surgery to insert a device called a gastric electrical stimulator into your body. This device helps improve stomach emptying and control nausea and vomiting. Follow these instructions at home:  Take over-the-counter and prescription medicines only as told by your health care provider.  Follow instructions from your health care provider about eating or drinking restrictions. Your health care provider may recommend that you: ? Eat smaller meals more often. ? Eat low-fat foods. ? Eat low-fiber forms of high-fiber foods. For example, eat cooked vegetables instead of raw vegetables. ? Have only liquid foods instead of solid foods. Liquid foods are easier to digest.  Drink enough fluid to keep your urine pale yellow.  Exercise as often as told by your health care provider.  Keep all follow-up visits. This is important. Contact a health care provider if you:  Notice that your symptoms do not improve with treatment.  Have new  symptoms. Get help right away if you:  Have severe pain in your abdomen that does not improve with treatment.  Have nausea that is severe or does not go away.  Vomit every time you drink fluids. Summary  Gastroparesis is a long-term (chronic) condition in which food takes longer than normal to empty from the stomach.  Symptoms include nausea, vomiting, heartburn, bloating of your abdomen, and loss of appetite.  Eating smaller portions, low-fat foods, and low-fiber forms of high-fiber foods may help you manage your symptoms.  Get help right away if you have severe pain in your abdomen. This information is not intended to replace advice given to you by your health care provider. Make sure you discuss any questions you have with your health care provider. Document Revised: 02/13/2020 Document Reviewed: 02/13/2020 Elsevier Patient Education  2021 [employer].

## 2021-02-05 NOTE — Progress Notes (Signed)
Chief Complaint  Patient presents with  . Follow-up   Annual  1. BP improved on hyzaar 100-25 mg qd she is working to lose weight goal down to 140 was 115 af HS age 56. 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 Needs new ob/gyn given reference for PFW prefers female  4. Left frozen shoulder in PT at [facility] f/u Dr. Tamera Punt [facility] had steroid inj x 1 not much improvement after inj. 5. Gastroparesis sx's even after being of GLP meds had w/u with EGD with Gi  But improved some still intermittent  Review of Systems  Constitutional: Positive for weight loss.  HENT: Negative for hearing loss.   Eyes: Negative for blurred vision.  Respiratory: Negative for shortness of breath.   Cardiovascular: Negative for chest pain.  Gastrointestinal: Negative for abdominal pain.  Musculoskeletal: Positive for joint pain.  Skin: Negative for rash.  Neurological: Negative for headaches.  Psychiatric/Behavioral: Negative for depression.   Past Medical History:  Diagnosis Date  . Allergy   . COVID-19    11/01/20  . Essential hypertension   . Fatty liver   . 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 Tamera Punt [facility]  . GERD (gastroesophageal reflux disease)   . Gestational diabetes   . Hyperlipidemia   . IBS (irritable bowel syndrome)   . Kidney stones    x 3 occurrences   . Melanoma ([place])   . Obesity (BMI 30-39.9)   . Renal disorder   . Shingles    Past Surgical History:  Procedure Laterality Date  . BREAST REDUCTION SURGERY    . CESAREAN SECTION    . LITHOTRIPSY     x1  . MELANOMA EXCISION     Right arm  . NASAL SINUS SURGERY    . TONSILLECTOMY     Family History  Problem Relation Age of Onset  . Heart attack Mother   . Hypertension Mother   . Heart disease Mother        MI  . Diabetes Father   . Prostate cancer Father   . Breast cancer Sister    Social History   Socioeconomic History  . Marital status: Married    Spouse  name: Gershon Mussel  . Number of children: 2  . Years of education: Not on file  . Highest education level: High school graduate  Occupational History  . Not on file  Tobacco Use  . Smoking status: Never Smoker  . Smokeless tobacco: Never Used  Substance and Sexual Activity  . Alcohol use: No  . Drug use: No  . Sexual activity: Not on file  Other Topics Concern  . Not on file  Social History Narrative   Married since 73 y.o    2 children.   No grandchildren.   Self employed.   Enjoys shopping, decorating.    Social Determinants of Health   Financial Resource Strain: Not on file  Food Insecurity: Not on file  Transportation Needs: Not on file  Physical Activity: Not on file  Stress: Not on file  Social Connections: Not on file  Intimate Partner Violence: Not on file   Current Meds  Medication Sig  . bifidobacterium infantis (ALIGN) capsule Take by mouth.  . cetirizine (ZYRTEC) 10 MG tablet Take 10 mg by mouth daily.  Marland Kitchen estradiol (VIVELLE-DOT) 0.0375 MG/24HR Place onto the skin.  Marland Kitchen LORazepam (ATIVAN) 0.5 MG tablet Take 1  tablet (0.5 mg total) by mouth at bedtime as needed for anxiety.  Marland Kitchen losartan-hydrochlorothiazide (HYZAAR) 100-25 MG tablet Take 1 tablet by mouth daily. In am  . Multiple Vitamin (MULTIVITAMIN) tablet Take 1 tablet by mouth daily.  . Omega-3 1000 MG CAPS Take by mouth.  . ondansetron (ZOFRAN) 4 MG tablet Take 1 tablet (4 mg total) by mouth every 8 (eight) hours as needed for nausea or vomiting.  . pantoprazole (PROTONIX) 40 MG tablet Take 40 mg by mouth daily.  . [DISCONTINUED] FLUoxetine (PROZAC) 20 MG tablet Take 20 mg by mouth daily.  . [DISCONTINUED] glipiZIDE (GLUCOTROL XL) 2.5 MG 24 hr tablet Take 1 tablet (2.5 mg total) by mouth daily with breakfast.  . [DISCONTINUED] pravastatin (PRAVACHOL) 20 MG tablet TAKE 1 TABLET BY MOUTH AT BEDTIME   Allergies  Allergen Reactions  . Metformin     Diarrhea  . Metformin And Related     Diarrhea   . Reglan  [Metoclopramide]     Did not work fatigue, weight gain, increased anxiety   No results found for this or any previous visit (from the past 2160 hour(s)). Objective  Body mass index is 31.29 kg/m. Wt Readings from Last 3 Encounters:  02/05/21 160 lb 3.2 oz (72.7 kg)  11/02/20 154 lb (69.9 kg)  07/26/20 172 lb 9.6 oz (78.3 kg)   Temp Readings from Last 3 Encounters:  02/05/21 98.1 F (36.7 C) (Oral)  07/26/20 98.4 F (36.9 C)  04/24/20 98.5 F (36.9 C) (Oral)   BP Readings from Last 3 Encounters:  02/05/21 124/82  11/02/20 110/77  07/26/20 122/90   Pulse Readings from Last 3 Encounters:  11/02/20 99  07/26/20 98  04/24/20 (!) 108    Physical Exam Vitals and nursing note reviewed.  Constitutional:      Appearance: Normal appearance. She is well-developed and well-groomed. She is obese.  HENT:     Head: Normocephalic and atraumatic.  Eyes:     Conjunctiva/sclera: Conjunctivae normal.     Pupils: Pupils are equal, round, and reactive to light.  Cardiovascular:     Rate and Rhythm: Normal rate and regular rhythm.     Heart sounds: Normal heart sounds. No murmur heard.   Pulmonary:     Effort: Pulmonary effort is normal.     Breath sounds: Normal breath sounds.  Skin:    General: Skin is warm and dry.  Neurological:     General: No focal deficit present.     Mental Status: She is alert and oriented to person, place, and time. Mental status is at baseline.     Gait: Gait normal.  Psychiatric:        Attention and Perception: Attention and perception normal.        Mood and Affect: Mood and affect normal.        Speech: Speech normal.        Behavior: Behavior normal. Behavior is cooperative.        Thought Content: Thought content normal.        Cognition and Memory: Cognition and memory normal.        Judgment: Judgment normal.     Assessment  Plan  Annual physical exam Had flu shot2020 07/16/20 [facility]  Tdapwill reschedule  Hep A/B vx today3/3 for  fatty liver3rd twinrix today 05/10/19 -hep B immune Neg hep C consider pna 23 vaccine in the future Declines covidh/o Covid 19 + Consider shingrix in future h/o shingles  Pap 02/08/18 neg  neg hpv disc est PFW in future as of 02/05/21  mammo neg [place] mammogram7/29/21negative  colonoscopy had 2/17/16bx negativecheck GI when duewill f/u with Dr. Earley F[facility] No FH colon cancer  Also had EGD in 2021/22  Referredto Dr. Kellie Moor skinpt has not had derm appt as of 02/05/21 has # to call and schedule will do this rec healthy diet and exercise  [facility] [phone] GERD  IBS, dysphagia/GERD/constipation  Plan: EGD prilosec 20 mg bid f/u in 8 weeks Dr. Esaw Dace 1/22/21[facility] [place] [employer] EGD 11/29/19 erythema in duodenum 2nd part, erythema antrum hiatal hernia bxs taken neg, neg H pylori   03/01/21 Dr. Tamera Punt ortho f/u in [place] left frozen shoulder had 1/2 steroid shots Adhesive capsulitis of left shoulder  Hypertension associated with diabetes ([place]) - Plan: glipiZIDE (GLUCOTROL XL) 2.5 MG 24 hr tablet BP controlled on hyzaar 100-25 mg qd  Hyperlipidemia, unspecified hyperlipidemia type - Plan: pravastatin (PRAVACHOL) 20 MG tablet Records [facility]   Gastroparesis  F/u with GI  Prn zofran   Provider: Dr. Olivia Mackie McLean-Scocuzza-Internal Medicine

## 2021-02-06 DIAGNOSIS — Z0000 Encounter for general adult medical examination without abnormal findings: Secondary | ICD-10-CM | POA: Insufficient documentation

## 2021-02-07 DIAGNOSIS — M25612 Stiffness of left shoulder, not elsewhere classified: Secondary | ICD-10-CM | POA: Diagnosis not present

## 2021-02-07 DIAGNOSIS — M25512 Pain in left shoulder: Secondary | ICD-10-CM | POA: Diagnosis not present

## 2021-02-19 ENCOUNTER — Other Ambulatory Visit: Payer: Self-pay | Admitting: Internal Medicine

## 2021-02-19 DIAGNOSIS — F32A Depression, unspecified: Secondary | ICD-10-CM

## 2021-02-19 DIAGNOSIS — M25612 Stiffness of left shoulder, not elsewhere classified: Secondary | ICD-10-CM | POA: Diagnosis not present

## 2021-02-19 DIAGNOSIS — M25512 Pain in left shoulder: Secondary | ICD-10-CM | POA: Diagnosis not present

## 2021-02-19 DIAGNOSIS — F419 Anxiety disorder, unspecified: Secondary | ICD-10-CM

## 2021-02-19 MED ORDER — FLUOXETINE HCL 20 MG PO CAPS: 20.0000 mg | ORAL_CAPSULE | Freq: Every day | ORAL | 3 refills | Status: DC

## 2021-02-26 DIAGNOSIS — M25612 Stiffness of left shoulder, not elsewhere classified: Secondary | ICD-10-CM | POA: Diagnosis not present

## 2021-02-26 DIAGNOSIS — M25512 Pain in left shoulder: Secondary | ICD-10-CM | POA: Diagnosis not present

## 2021-02-27 ENCOUNTER — Ambulatory Visit (INDEPENDENT_AMBULATORY_CARE_PROVIDER_SITE_OTHER): Payer: BC Managed Care – PPO

## 2021-02-27 ENCOUNTER — Other Ambulatory Visit: Payer: Self-pay

## 2021-02-27 ENCOUNTER — Other Ambulatory Visit (INDEPENDENT_AMBULATORY_CARE_PROVIDER_SITE_OTHER): Payer: BC Managed Care – PPO

## 2021-02-27 DIAGNOSIS — M5414 Radiculopathy, thoracic region: Secondary | ICD-10-CM | POA: Diagnosis not present

## 2021-02-27 DIAGNOSIS — Z23 Encounter for immunization: Secondary | ICD-10-CM

## 2021-02-27 DIAGNOSIS — M9902 Segmental and somatic dysfunction of thoracic region: Secondary | ICD-10-CM | POA: Diagnosis not present

## 2021-02-27 DIAGNOSIS — E1159 Type 2 diabetes mellitus with other circulatory complications: Secondary | ICD-10-CM | POA: Diagnosis not present

## 2021-02-27 DIAGNOSIS — Z1389 Encounter for screening for other disorder: Secondary | ICD-10-CM

## 2021-02-27 DIAGNOSIS — M9901 Segmental and somatic dysfunction of cervical region: Secondary | ICD-10-CM | POA: Diagnosis not present

## 2021-02-27 DIAGNOSIS — I152 Hypertension secondary to endocrine disorders: Secondary | ICD-10-CM | POA: Diagnosis not present

## 2021-02-27 DIAGNOSIS — K3184 Gastroparesis: Secondary | ICD-10-CM

## 2021-02-27 DIAGNOSIS — U071 COVID-19: Secondary | ICD-10-CM

## 2021-02-27 DIAGNOSIS — R519 Headache, unspecified: Secondary | ICD-10-CM | POA: Diagnosis not present

## 2021-02-27 LAB — COMPREHENSIVE METABOLIC PANEL
ALT: 17 U/L (ref 0–35)
AST: 15 U/L (ref 0–37)
Albumin: 4.5 g/dL (ref 3.5–5.2)
Alkaline Phosphatase: 42 U/L (ref 39–117)
BUN: 17 mg/dL (ref 6–23)
CO2: 33 mEq/L — ABNORMAL HIGH (ref 19–32)
Calcium: 9.8 mg/dL (ref 8.4–10.5)
Chloride: 100 mEq/L (ref 96–112)
Creatinine, Ser: 0.77 mg/dL (ref 0.40–1.20)
GFR: 88.67 mL/min (ref >60.00)
Glucose, Bld: 128 mg/dL — ABNORMAL HIGH (ref 70–99)
Potassium: 3.7 mEq/L (ref 3.5–5.1)
Sodium: 141 mEq/L (ref 135–145)
Total Bilirubin: 0.6 mg/dL (ref 0.2–1.2)
Total Protein: 6.7 g/dL (ref 6.0–8.3)

## 2021-02-27 LAB — CBC WITH DIFFERENTIAL/PLATELET
Basophils Absolute: 0 10*3/uL (ref 0.0–0.1)
Basophils Relative: 0.3 % (ref 0.0–3.0)
Eosinophils Absolute: 0.2 10*3/uL (ref 0.0–0.7)
Eosinophils Relative: 3.1 % (ref 0.0–5.0)
HCT: 39.7 % (ref 36.0–46.0)
Hemoglobin: 13.7 g/dL (ref 12.0–15.0)
Lymphocytes Relative: 22.9 % (ref 12.0–46.0)
Lymphs Abs: 1.3 10*3/uL (ref 0.7–4.0)
MCHC: 34.4 g/dL (ref 30.0–36.0)
MCV: 89.4 fl (ref 78.0–100.0)
Monocytes Absolute: 0.3 10*3/uL (ref 0.1–1.0)
Monocytes Relative: 4.4 % (ref 3.0–12.0)
Neutro Abs: 3.9 10*3/uL (ref 1.4–7.7)
Neutrophils Relative %: 69.3 % (ref 43.0–77.0)
Platelets: 335 10*3/uL (ref 150.0–400.0)
RBC: 4.44 Mil/uL (ref 3.87–5.11)
RDW: 12.7 % (ref 11.5–15.5)
WBC: 5.7 10*3/uL (ref 4.0–10.5)

## 2021-02-27 LAB — LIPID PANEL
Cholesterol: 147 mg/dL (ref 0–200)
HDL: 48.7 mg/dL (ref >39.00)
LDL Cholesterol: 71 mg/dL (ref 0–99)
NonHDL: 98.2
Total CHOL/HDL Ratio: 3
Triglycerides: 135 mg/dL (ref 0.0–149.0)
VLDL: 27 mg/dL (ref 0.0–40.0)

## 2021-02-27 LAB — HEMOGLOBIN A1C: Hgb A1c MFr Bld: 5.9 % (ref 4.6–6.5)

## 2021-02-27 NOTE — Progress Notes (Signed)
Patient presented for TDAP injection to right deltoid, patient voiced no concerns nor showed any signs of distress during injection. 

## 2021-03-01 LAB — MICROALBUMIN / CREATININE URINE RATIO
Creatinine, Urine: 393.2 mg/dL
Microalb/Creat Ratio: 6 mg/g creat (ref 0–29)
Microalbumin, Urine: 23.3 ug/mL

## 2021-03-01 LAB — MICROSCOPIC EXAMINATION
Casts: NONE SEEN /lpf
Epithelial Cells (non renal): >10 /hpf — AB (ref 0–10)
RBC, Urine: NONE SEEN /hpf (ref 0–2)
WBC, UA: NONE SEEN /hpf (ref 0–5)

## 2021-03-01 LAB — URINALYSIS, ROUTINE W REFLEX MICROSCOPIC
Bilirubin, UA: NEGATIVE
Glucose, UA: NEGATIVE
Nitrite, UA: NEGATIVE
RBC, UA: NEGATIVE
Specific Gravity, UA: >=1.03 — AB (ref 1.005–1.030)
Urobilinogen, Ur: 1 mg/dL (ref 0.2–1.0)
pH, UA: 5.5 (ref 5.0–7.5)

## 2021-03-03 ENCOUNTER — Other Ambulatory Visit: Payer: Self-pay | Admitting: Internal Medicine

## 2021-03-03 DIAGNOSIS — B3731 Acute candidiasis of vulva and vagina: Secondary | ICD-10-CM

## 2021-03-03 DIAGNOSIS — B373 Candidiasis of vulva and vagina: Secondary | ICD-10-CM

## 2021-03-03 MED ORDER — FLUCONAZOLE 150 MG PO TABS: 150.0000 mg | ORAL_TABLET | Freq: Once | ORAL | 0 refills | Status: AC

## 2021-03-12 ENCOUNTER — Encounter: Payer: Self-pay | Admitting: Internal Medicine

## 2021-03-12 DIAGNOSIS — M25512 Pain in left shoulder: Secondary | ICD-10-CM | POA: Diagnosis not present

## 2021-03-12 DIAGNOSIS — M25612 Stiffness of left shoulder, not elsewhere classified: Secondary | ICD-10-CM | POA: Diagnosis not present

## 2021-03-26 DIAGNOSIS — M25512 Pain in left shoulder: Secondary | ICD-10-CM | POA: Diagnosis not present

## 2021-03-26 DIAGNOSIS — M25612 Stiffness of left shoulder, not elsewhere classified: Secondary | ICD-10-CM | POA: Diagnosis not present

## 2021-03-28 DIAGNOSIS — D2272 Melanocytic nevi of left lower limb, including hip: Secondary | ICD-10-CM | POA: Diagnosis not present

## 2021-03-28 DIAGNOSIS — D2262 Melanocytic nevi of left upper limb, including shoulder: Secondary | ICD-10-CM | POA: Diagnosis not present

## 2021-03-28 DIAGNOSIS — D225 Melanocytic nevi of trunk: Secondary | ICD-10-CM | POA: Diagnosis not present

## 2021-03-28 DIAGNOSIS — D2261 Melanocytic nevi of right upper limb, including shoulder: Secondary | ICD-10-CM | POA: Diagnosis not present

## 2021-04-03 NOTE — Telephone Encounter (Signed)
Please advise 

## 2021-04-24 ENCOUNTER — Encounter: Payer: Self-pay | Admitting: Internal Medicine

## 2021-04-24 ENCOUNTER — Other Ambulatory Visit: Payer: Self-pay | Admitting: Internal Medicine

## 2021-04-24 DIAGNOSIS — E785 Hyperlipidemia, unspecified: Secondary | ICD-10-CM

## 2021-04-24 DIAGNOSIS — K219 Gastro-esophageal reflux disease without esophagitis: Secondary | ICD-10-CM

## 2021-04-24 MED ORDER — PANTOPRAZOLE SODIUM 40 MG PO TBEC: 40.0000 mg | DELAYED_RELEASE_TABLET | Freq: Every day | ORAL | 3 refills | Status: DC

## 2021-05-21 DIAGNOSIS — Z1231 Encounter for screening mammogram for malignant neoplasm of breast: Secondary | ICD-10-CM | POA: Diagnosis not present

## 2021-06-05 ENCOUNTER — Telehealth: Payer: Self-pay | Admitting: Internal Medicine

## 2021-06-05 DIAGNOSIS — R202 Paresthesia of skin: Secondary | ICD-10-CM | POA: Diagnosis not present

## 2021-06-05 DIAGNOSIS — M47812 Spondylosis without myelopathy or radiculopathy, cervical region: Secondary | ICD-10-CM | POA: Diagnosis not present

## 2021-06-05 DIAGNOSIS — M549 Dorsalgia, unspecified: Secondary | ICD-10-CM | POA: Diagnosis not present

## 2021-06-05 DIAGNOSIS — M5030 Other cervical disc degeneration, unspecified cervical region: Secondary | ICD-10-CM | POA: Diagnosis not present

## 2021-06-05 DIAGNOSIS — M542 Cervicalgia: Secondary | ICD-10-CM | POA: Diagnosis not present

## 2021-06-05 DIAGNOSIS — M7918 Myalgia, other site: Secondary | ICD-10-CM | POA: Diagnosis not present

## 2021-06-05 NOTE — Telephone Encounter (Signed)
Lft pt vm to call ofc to fu if pt has had appt at [facility] hbo.thanks

## 2021-06-05 NOTE — Telephone Encounter (Signed)
Patient called and she did get a mammogram and everything is fine.

## 2021-06-11 DIAGNOSIS — M542 Cervicalgia: Secondary | ICD-10-CM | POA: Diagnosis not present

## 2021-06-18 DIAGNOSIS — M542 Cervicalgia: Secondary | ICD-10-CM | POA: Diagnosis not present

## 2021-06-27 DIAGNOSIS — M542 Cervicalgia: Secondary | ICD-10-CM | POA: Diagnosis not present

## 2021-07-01 DIAGNOSIS — M542 Cervicalgia: Secondary | ICD-10-CM | POA: Diagnosis not present

## 2021-07-04 ENCOUNTER — Other Ambulatory Visit: Payer: Self-pay | Admitting: Family

## 2021-07-04 DIAGNOSIS — F419 Anxiety disorder, unspecified: Secondary | ICD-10-CM

## 2021-07-05 MED ORDER — LORAZEPAM 0.5 MG PO TABS
0.5000 mg | ORAL_TABLET | Freq: Every evening | ORAL | 0 refills | Status: DC | PRN
Start: 2021-07-05 — End: 2022-10-03

## 2021-07-08 DIAGNOSIS — M542 Cervicalgia: Secondary | ICD-10-CM | POA: Diagnosis not present

## 2021-07-18 ENCOUNTER — Other Ambulatory Visit: Payer: Self-pay | Admitting: Internal Medicine

## 2021-07-18 DIAGNOSIS — E785 Hyperlipidemia, unspecified: Secondary | ICD-10-CM

## 2021-07-18 MED ORDER — PRAVASTATIN SODIUM 20 MG PO TABS: 20.0000 mg | ORAL_TABLET | Freq: Every day | ORAL | 0 refills | Status: DC

## 2021-07-30 DIAGNOSIS — G5603 Carpal tunnel syndrome, bilateral upper limbs: Secondary | ICD-10-CM | POA: Diagnosis not present

## 2021-07-30 DIAGNOSIS — M79642 Pain in left hand: Secondary | ICD-10-CM | POA: Diagnosis not present

## 2021-07-30 DIAGNOSIS — G5621 Lesion of ulnar nerve, right upper limb: Secondary | ICD-10-CM | POA: Diagnosis not present

## 2021-07-30 DIAGNOSIS — M79641 Pain in right hand: Secondary | ICD-10-CM | POA: Diagnosis not present

## 2021-07-31 ENCOUNTER — Encounter: Payer: Self-pay | Admitting: Internal Medicine

## 2021-08-05 ENCOUNTER — Other Ambulatory Visit: Payer: Self-pay | Admitting: Internal Medicine

## 2021-08-05 DIAGNOSIS — E1159 Type 2 diabetes mellitus with other circulatory complications: Secondary | ICD-10-CM

## 2021-08-07 ENCOUNTER — Ambulatory Visit (INDEPENDENT_AMBULATORY_CARE_PROVIDER_SITE_OTHER): Payer: BC Managed Care – PPO | Admitting: Internal Medicine

## 2021-08-07 ENCOUNTER — Encounter: Payer: Self-pay | Admitting: Internal Medicine

## 2021-08-07 ENCOUNTER — Other Ambulatory Visit: Payer: Self-pay

## 2021-08-07 VITALS — BP 110/82 | HR 84 | Temp 96.6°F | Ht 60.0 in | Wt 169.4 lb

## 2021-08-07 DIAGNOSIS — E781 Pure hyperglyceridemia: Secondary | ICD-10-CM

## 2021-08-07 DIAGNOSIS — I10 Essential (primary) hypertension: Secondary | ICD-10-CM

## 2021-08-07 DIAGNOSIS — K219 Gastro-esophageal reflux disease without esophagitis: Secondary | ICD-10-CM

## 2021-08-07 DIAGNOSIS — Z23 Encounter for immunization: Secondary | ICD-10-CM | POA: Diagnosis not present

## 2021-08-07 DIAGNOSIS — M545 Low back pain, unspecified: Secondary | ICD-10-CM | POA: Diagnosis not present

## 2021-08-07 DIAGNOSIS — R109 Unspecified abdominal pain: Secondary | ICD-10-CM

## 2021-08-07 DIAGNOSIS — E785 Hyperlipidemia, unspecified: Secondary | ICD-10-CM

## 2021-08-07 DIAGNOSIS — G5603 Carpal tunnel syndrome, bilateral upper limbs: Secondary | ICD-10-CM

## 2021-08-07 DIAGNOSIS — F419 Anxiety disorder, unspecified: Secondary | ICD-10-CM

## 2021-08-07 DIAGNOSIS — K760 Fatty (change of) liver, not elsewhere classified: Secondary | ICD-10-CM

## 2021-08-07 DIAGNOSIS — E1159 Type 2 diabetes mellitus with other circulatory complications: Secondary | ICD-10-CM

## 2021-08-07 DIAGNOSIS — E119 Type 2 diabetes mellitus without complications: Secondary | ICD-10-CM

## 2021-08-07 DIAGNOSIS — F32A Depression, unspecified: Secondary | ICD-10-CM

## 2021-08-07 DIAGNOSIS — Z1329 Encounter for screening for other suspected endocrine disorder: Secondary | ICD-10-CM

## 2021-08-07 DIAGNOSIS — I152 Hypertension secondary to endocrine disorders: Secondary | ICD-10-CM

## 2021-08-07 NOTE — Progress Notes (Signed)
Chief Complaint  Patient presents with   Flank Pain    Pt c/o left side pain x1week   F/ u 1. 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 C/o left flank pain and low back pain w/o radiation 3/10 today no h/o trauma/falls 3. C/w cts and has appt 08/26/21 to f/u    Review of Systems  Constitutional:  Negative for weight loss.  HENT:  Negative for hearing loss.   Eyes:  Negative for blurred vision.  Respiratory:  Negative for shortness of breath.   Cardiovascular:  Negative for chest pain.  Gastrointestinal:  Negative for abdominal pain and blood in stool.  Genitourinary:  Negative for dysuria.  Musculoskeletal:  Positive for back pain and joint pain.  Skin:  Negative for rash.  Neurological:  Positive for sensory change. Negative for headaches.  Psychiatric/Behavioral:  Negative for depression.   Past Medical History:  Diagnosis Date   Allergy    Carpal tunnel syndrome, bilateral    COVID-19    11/01/20   Episcleritis of right eye    08/22/20 lotemax 0.35 R eye tid x  1 week patty vision   Essential hypertension    Fatty liver    Frozen shoulder syndrome    left 11/2020 Dr. Tamera Punt [facility]   GERD (gastroesophageal reflux disease)    Gestational diabetes    Hyperlipidemia    IBS (irritable bowel syndrome)    Kidney stones    x 3 occurrences    Melanoma ([place])    Obesity (BMI 30-39.9)    Renal disorder    Shingles    Past Surgical History:  Procedure Laterality Date   BREAST REDUCTION SURGERY     CARPAL TUNNEL RELEASE     09/17/21 right hand tbd left hand   CESAREAN SECTION     LITHOTRIPSY     x1   MELANOMA EXCISION     Right arm   NASAL SINUS SURGERY     TONSILLECTOMY     Family History  Problem Relation Age of Onset   Heart attack Mother    Hypertension Mother    Heart disease Mother        MI   Diabetes Father    Prostate cancer Father    Breast cancer Sister     Social History   Socioeconomic History   Marital status: Married    Spouse name: Tom   Number of children: 2   Years of education: Not on file   Highest education level: High school graduate  Occupational History   Not on file  Tobacco Use   Smoking status: Never   Smokeless tobacco: Never  Substance and Sexual Activity   Alcohol use: No   Drug use: No   Sexual activity: Not on file  Other Topics Concern   Not on file  Social History Narrative   Married since 52 y.o    2 children.   No grandchildren.   Self employed.   Enjoys shopping, decorating.    Social Determinants of Health   Financial Resource Strain: Not on file  Food Insecurity: Not on file  Transportation Needs: Not on file  Physical Activity: Not on file  Stress: Not on file  Social Connections: Not on file  Intimate Partner Violence: Not on file   Current Meds  Medication Sig   bifidobacterium infantis (ALIGN) capsule Take by mouth.  cetirizine (ZYRTEC) 10 MG tablet Take 10 mg by mouth daily.   FLUoxetine (PROZAC) 20 MG capsule Take 1 capsule (20 mg total) by mouth daily. D/c tablet   glipiZIDE (GLUCOTROL XL) 2.5 MG 24 hr tablet Take 1 tablet by mouth once daily with breakfast   LORazepam (ATIVAN) 0.5 MG tablet Take 1 tablet (0.5 mg total) by mouth at bedtime as needed for anxiety.   losartan-hydrochlorothiazide (HYZAAR) 100-25 MG tablet Take 1 tablet by mouth daily. In am   Multiple Vitamin (MULTIVITAMIN) tablet Take 1 tablet by mouth daily.   Omega-3 1000 MG CAPS Take by mouth.   ondansetron (ZOFRAN) 4 MG tablet Take 1 tablet (4 mg total) by mouth every 8 (eight) hours as needed for nausea or vomiting.   pantoprazole (PROTONIX) 40 MG tablet Take 1 tablet (40 mg total) by mouth daily.   [DISCONTINUED] pravastatin (PRAVACHOL) 20 MG tablet Take 1 tablet (20 mg total) by mouth at bedtime.   Allergies  Allergen Reactions   Metformin Other (See Comments)    Diarrhea Diarrhea Diarrhea    Metformin  And Related     Diarrhea    Metoclopramide Other (See Comments)    Did not work fatigue, weight gain, increased anxiety Did not work fatigue, weight gain, increased anxiety Did not work fatigue, weight gain, increased anxiety   Ozempic (0.25 Or 0.5 Mg-Dose) [Semaglutide(0.25 Or 0.5mg -Dos)]     Gastroparesis like sx's   Recent Results (from the past 2160 hour(s))  Urinalysis, Routine w reflex microscopic     Status: None   Collection Time: 08/07/21  2:28 PM  Result Value Ref Range   Color, Urine YELLOW YELLOW   APPearance CLEAR CLEAR   Specific Gravity, Urine 1.011 1.001 - 1.035   pH 7.5 5.0 - 8.0   Glucose, UA NEGATIVE NEGATIVE   Bilirubin Urine NEGATIVE NEGATIVE   Ketones, ur NEGATIVE NEGATIVE   Hgb urine dipstick NEGATIVE NEGATIVE   Protein, ur NEGATIVE NEGATIVE   Nitrite NEGATIVE NEGATIVE   Leukocytes,Ua NEGATIVE NEGATIVE  Urine Culture     Status: None   Collection Time: 08/07/21  2:28 PM   Specimen: Urine  Result Value Ref Range   MICRO NUMBER: [id number]    SPECIMEN QUALITY: Adequate    Sample Source NOT GIVEN    STATUS: FINAL    Result: No Growth   TSH     Status: None   Collection Time: 08/30/21  8:07 AM  Result Value Ref Range   TSH 3.02 0.35 - 5.50 uIU/mL  Hemoglobin A1c     Status: None   Collection Time: 08/30/21  8:07 AM  Result Value Ref Range   Hgb A1c MFr Bld 6.4 4.6 - 6.5 %    Comment: Glycemic Control Guidelines for People with Diabetes:Non Diabetic:  <6%Goal of Therapy: <7%Additional Action Suggested:  >8%   CBC with Differential/Platelet     Status: None   Collection Time: 08/30/21  8:07 AM  Result Value Ref Range   WBC 5.0 4.0 - 10.5 K/uL   RBC 4.35 3.87 - 5.11 Mil/uL   Hemoglobin 13.2 12.0 - 15.0 g/dL   HCT 39.0 36.0 - 46.0 %   MCV 89.8 78.0 - 100.0 fl   MCHC 33.7 30.0 - 36.0 g/dL   RDW 13.4 11.5 - 15.5 %   Platelets 336.0 150.0 - 400.0 K/uL   Neutrophils Relative % 65.3 43.0 - 77.0 %   Lymphocytes Relative 26.8 12.0 - 46.0 %   Monocytes  Relative 5.1 3.0 -  12.0 %   Eosinophils Relative 2.3 0.0 - 5.0 %   Basophils Relative 0.5 0.0 - 3.0 %   Neutro Abs 3.3 1.4 - 7.7 K/uL   Lymphs Abs 1.3 0.7 - 4.0 K/uL   Monocytes Absolute 0.3 0.1 - 1.0 K/uL   Eosinophils Absolute 0.1 0.0 - 0.7 K/uL   Basophils Absolute 0.0 0.0 - 0.1 K/uL  Lipid panel     Status: None   Collection Time: 08/30/21  8:07 AM  Result Value Ref Range   Cholesterol 154 0 - 200 mg/dL    Comment: ATP III Classification       Desirable:  < 200 mg/dL               Borderline High:  200 - 239 mg/dL          High:  > = 240 mg/dL   Triglycerides 123.0 0.0 - 149.0 mg/dL    Comment: Normal:  <150 mg/dLBorderline High:  150 - 199 mg/dL   HDL 53.50 >39.00 mg/dL   VLDL 24.6 0.0 - 40.0 mg/dL   LDL Cholesterol 76 0 - 99 mg/dL   Total CHOL/HDL Ratio 3     Comment:                Men          Women1/2 Average Risk     3.4          3.3Average Risk          5.0          4.42X Average Risk          9.6          7.13X Average Risk          15.0          11.0                       NonHDL 100.59     Comment: NOTE:  Non-HDL goal should be 30 mg/dL higher than patient's LDL goal (i.e. LDL goal of < 70 mg/dL, would have non-HDL goal of < 100 mg/dL)  Comprehensive metabolic panel     Status: Abnormal   Collection Time: 08/30/21  8:07 AM  Result Value Ref Range   Sodium 141 135 - 145 mEq/L   Potassium 3.7 3.5 - 5.1 mEq/L   Chloride 101 96 - 112 mEq/L   CO2 32 19 - 32 mEq/L   Glucose, Bld 144 (H) 70 - 99 mg/dL   BUN 18 6 - 23 mg/dL   Creatinine, Ser 0.79 0.40 - 1.20 mg/dL   Total Bilirubin 0.4 0.2 - 1.2 mg/dL   Alkaline Phosphatase 38 (L) 39 - 117 U/L   AST 20 0 - 37 U/L   ALT 21 0 - 35 U/L   Total Protein 6.9 6.0 - 8.3 g/dL   Albumin 4.4 3.5 - 5.2 g/dL   GFR 85.68 >60.00 mL/min    Comment: Calculated using the CKD-EPI Creatinine Equation (2021)   Calcium 9.6 8.4 - 10.5 mg/dL   Objective  Body mass index is 33.08 kg/m. Wt Readings from Last 3 Encounters:  08/07/21 169 lb  6.4 oz (76.8 kg)  02/05/21 160 lb 3.2 oz (72.7 kg)  11/02/20 154 lb (69.9 kg)   Temp Readings from Last 3 Encounters:  08/07/21 (!) 96.6 F (35.9 C)  02/05/21 98.1 F (36.7 C) (Oral)  07/26/20 98.4 F (36.9 C)   BP Readings  from Last 3 Encounters:  08/07/21 110/82  02/05/21 124/82  11/02/20 110/77   Pulse Readings from Last 3 Encounters:  08/07/21 84  11/02/20 99  07/26/20 98    Physical Exam Vitals and nursing note reviewed.  Constitutional:      Appearance: Normal appearance. She is well-developed and well-groomed.  HENT:     Head: Normocephalic and atraumatic.  Eyes:     Conjunctiva/sclera: Conjunctivae normal.     Pupils: Pupils are equal, round, and reactive to light.  Cardiovascular:     Rate and Rhythm: Normal rate and regular rhythm.     Heart sounds: Normal heart sounds. No murmur heard. Pulmonary:     Effort: Pulmonary effort is normal.     Breath sounds: Normal breath sounds.  Abdominal:     General: Abdomen is flat. Bowel sounds are normal.     Tenderness: There is no abdominal tenderness.  Musculoskeletal:        General: No tenderness.  Skin:    General: Skin is warm and dry.  Neurological:     General: No focal deficit present.     Mental Status: She is alert and oriented to person, place, and time. Mental status is at baseline.     Cranial Nerves: Cranial nerves 2-12 are intact.     Gait: Gait is intact.  Psychiatric:        Attention and Perception: Attention and perception normal.        Mood and Affect: Mood and affect normal.        Speech: Speech normal.        Behavior: Behavior normal. Behavior is cooperative.        Thought Content: Thought content normal.        Cognition and Memory: Cognition and memory normal.        Judgment: Judgment normal.    Assessment  Plan  Hypertension associated with diabetes controlled A1c <7.0 02/27/21 5.9/hld with h/o fatty liver- BP controlled on hyzaar 100-25 mg qd on pravachcol 20 mg qhs  Sch  fasting labs Plan: Comprehensive metabolic panel, Lipid panel, CBC with Differential/Platelet, Hemoglobin A1c Foot exam at f/u  Low back pain without sciatica, and left flank pain 3/10- Plan: Urinalysis, Routine w reflex microscopic, Urine Culture If continues consider imaging  Otc tylenol, heat/ice/exercises  Consider xrays low back vs ab imaging I.e ct scan   Carpal tunnel syndrome, bilateral  Emg had 07/30/21  Dr. Oleta [facility] will do surgery 09/17/21 right hand and tbd left   HM Had flu given today Tdap 02/27/21  Hep A/B vx today 3/3 for fatty liver 3rd twinrix today 05/10/19 -hep B immune -check hep A immunity in the future  Neg hep C consider prevnar, pna 23 vaccine in the future  Declines covid h/o Covid 19 + Consider shingrix in future h/o shingles had 1/2 shingrix 08/30/21    Pap 02/08/18 neg neg hpv disc est PFW in future as of 02/05/21 Pap sch 08/20/21 per pt as of 08/06/21    mammo neg [facility] mammogram 05/17/20 negative Mammogram 05/21/21 negative    colonoscopy had 12/06/14 bx negative check GI when due will f/u with Dr. Earley [facility]  No FH colon cancer  Also had EGD in 2021/22   Referred to Dr. Kellie Moor skin pt has not had derm appt as of 02/05/21 has # to call and schedule will do this  rec healthy diet and exercise   [facility] [place]  [phone] GERD  IBS, dysphagia/GERD/constipation  Plan: EGD prilosec 20 mg bid f/u in 8 weeks Dr. Esaw Dace 11/11/19 [facility] [place] EGD 11/29/19 erythema in duodenum 2nd part, erythema antrum hiatal hernia bxs taken neg, neg H pylori      03/01/21 Dr. Tamera Punt ortho f/u in [place] left frozen shoulder had 1/2 steroid shots Adhesive capsulitis of left shoulder   Neurology Dr. Rexford Maus   Provider: Dr. Olivia Mackie McLean-Scocuzza-[facility]

## 2021-08-07 NOTE — Patient Instructions (Addendum)
Eye exam 08/22/2021 due  Consider shingrix  vaccine x 2 doses  Consider pneumonia vaccine   Pneumococcal Polysaccharide Vaccine (PPSV23): What You Need to Know 1. Why get vaccinated? Pneumococcal polysaccharide vaccine (PPSV23) can prevent pneumococcal disease. Pneumococcal disease refers to any illness caused by pneumococcal bacteria. These bacteria can cause many types of illnesses, including pneumonia, which is an infection of the lungs. Pneumococcal bacteria are one of the most common causes of pneumonia. Besides pneumonia, pneumococcal bacteria can also cause: Ear infections Sinus infections Meningitis (infection of the tissue covering the brain and spinal cord) Bacteremia (bloodstream infection) Anyone can get pneumococcal disease, but children under 86 years of age, people with certain medical conditions, adults 27 years or older, and cigarette smokers are at the highest risk. Most pneumococcal infections are mild. However, some can result in long-term problems, such as brain damage or hearing loss. Meningitis, bacteremia, and pneumonia caused by pneumococcal disease can be fatal. 2. PPSV23 PPSV23 protects against 23 types of bacteria that cause pneumococcal disease. PPSV23 is recommended for: All adults 44 years or older, Anyone 2 years or older with certain medical conditions that can lead to an increased risk for pneumococcal disease. Most people need only one dose of PPSV23. A second dose of PPSV23, and another type of pneumococcal vaccine called PCV13, are recommended for certain high-risk groups. Your health care provider can give you more information. People 65 years or older should get a dose of PPSV23 even if they have already gotten one or more doses of the vaccine before they turned 50. 3. Talk with your health care provider Tell your vaccine provider if the person getting the vaccine: Has had an allergic reaction after a previous dose of PPSV23, or has any severe,  life-threatening allergies. In some cases, your health care provider may decide to postpone PPSV23 vaccination to a future visit. People with minor illnesses, such as a cold, may be vaccinated. People who are moderately or severely ill should usually wait until they recover before getting PPSV23. Your health care provider can give you more information. 4. Risks of a vaccine reaction Redness or pain where the shot is given, feeling tired, fever, or muscle aches can happen after PPSV23. People sometimes faint after medical procedures, including vaccination. Tell your provider if you feel dizzy or have vision changes or ringing in the ears. As with any medicine, there is a very remote chance of a vaccine causing a severe allergic reaction, other serious injury, or death. 5. What if there is a serious problem? An allergic reaction could occur after the vaccinated person leaves the clinic. If you see signs of a severe allergic reaction (hives, swelling of the face and throat, difficulty breathing, a fast heartbeat, dizziness, or weakness), call 9-1-1 and get the person to the nearest hospital. For other signs that concern you, call your health care provider. Adverse reactions should be reported to the Vaccine Adverse Event Reporting System (VAERS). Your health care provider will usually file this report, or you can do it yourself. Visit the VAERS website at www.vaers.SamedayNews.es or call 512-159-5497. VAERS is only for reporting reactions, and VAERS staff do not give medical advice. 6. How can I learn more? Ask your health care provider. Call your local or state health department. Contact the Centers for Disease Control and Prevention (CDC): Call 765 211 6367 (1-800-CDC-INFO) or Visit CDC's website at http://hunter.com/ Vaccine Information Statement PPSV23 Vaccine (08/18/2018) This information is not intended to replace advice given to you by your health care  provider. Make sure you discuss any  questions you have with your health care provider. Document Revised: 06/08/2020 Document Reviewed: 06/08/2020 Elsevier Patient Education  [place].  Zoster Vaccine, Recombinant injection What is this medication? ZOSTER VACCINE (ZOS ter vak SEEN) is a vaccine used to reduce the risk of getting shingles. This vaccine is not used to treat shingles or nerve pain from shingles. This medicine may be used for other purposes; ask your health care provider or pharmacist if you have questions. COMMON BRAND NAME(S): [facility] What should I tell my care team before I take this medication? They need to know if you have any of these conditions: cancer immune system problems an unusual or allergic reaction to Zoster vaccine, other medications, foods, dyes, or preservatives pregnant or trying to get pregnant breast-feeding How should I use this medication? This vaccine is injected into a muscle. It is given by a health care provider. A copy of Vaccine Information Statements will be given before each vaccination. Be sure to read this information carefully each time. This sheet may change often. Talk to your health care provider about the use of this vaccine in children. This vaccine is not approved for use in children. Overdosage: If you think you have taken too much of this medicine contact a poison control center or emergency room at once. NOTE: This medicine is only for you. Do not share this medicine with others. What if I miss a dose? Keep appointments for follow-up (booster) doses. It is important not to miss your dose. Call your health care provider if you are unable to keep an appointment. What may interact with this medication? medicines that suppress your immune system medicines to treat cancer steroid medicines like prednisone or cortisone This list may not describe all possible interactions. Give your health care provider a list of all the medicines, herbs, non-prescription drugs, or  dietary supplements you use. Also tell them if you smoke, drink alcohol, or use illegal drugs. Some items may interact with your medicine. What should I watch for while using this medication? Visit your health care provider regularly. This vaccine, like all vaccines, may not fully protect everyone. What side effects may I notice from receiving this medication? Side effects that you should report to your doctor or health care professional as soon as possible: allergic reactions (skin rash, itching or hives; swelling of the face, lips, or tongue) trouble breathing Side effects that usually do not require medical attention (report these to your doctor or health care professional if they continue or are bothersome): chills headache fever nausea pain, redness, or irritation at site where injected tiredness vomiting This list may not describe all possible side effects. Call your doctor for medical advice about side effects. You may report side effects to FDA at 1-800-FDA-1088. Where should I keep my medication? This vaccine is only given by a health care provider. It will not be stored at home. NOTE: This sheet is a summary. It may not cover all possible information. If you have questions about this medicine, talk to your doctor, pharmacist, or health care provider.  2022 Elsevier/Gold Standard (2019-11-11 16:23:07)

## 2021-08-08 DIAGNOSIS — M5414 Radiculopathy, thoracic region: Secondary | ICD-10-CM | POA: Diagnosis not present

## 2021-08-08 DIAGNOSIS — M9901 Segmental and somatic dysfunction of cervical region: Secondary | ICD-10-CM | POA: Diagnosis not present

## 2021-08-08 DIAGNOSIS — R519 Headache, unspecified: Secondary | ICD-10-CM | POA: Diagnosis not present

## 2021-08-08 DIAGNOSIS — M9902 Segmental and somatic dysfunction of thoracic region: Secondary | ICD-10-CM | POA: Diagnosis not present

## 2021-08-08 LAB — URINE CULTURE
MICRO NUMBER:: 12523536
Result:: NO GROWTH
SPECIMEN QUALITY:: ADEQUATE

## 2021-08-08 LAB — URINALYSIS, ROUTINE W REFLEX MICROSCOPIC
Bilirubin Urine: NEGATIVE
Glucose, UA: NEGATIVE
Hgb urine dipstick: NEGATIVE
Ketones, ur: NEGATIVE
Leukocytes,Ua: NEGATIVE
Nitrite: NEGATIVE
Protein, ur: NEGATIVE
Specific Gravity, Urine: 1.011 (ref 1.001–1.035)
pH: 7.5 (ref 5.0–8.0)

## 2021-08-16 ENCOUNTER — Encounter: Payer: Self-pay | Admitting: Internal Medicine

## 2021-08-20 ENCOUNTER — Encounter: Payer: Self-pay | Admitting: Internal Medicine

## 2021-08-20 DIAGNOSIS — Z79890 Hormone replacement therapy (postmenopausal): Secondary | ICD-10-CM | POA: Diagnosis not present

## 2021-08-20 DIAGNOSIS — N951 Menopausal and female climacteric states: Secondary | ICD-10-CM | POA: Diagnosis not present

## 2021-08-20 DIAGNOSIS — Z975 Presence of (intrauterine) contraceptive device: Secondary | ICD-10-CM | POA: Diagnosis not present

## 2021-08-20 DIAGNOSIS — Z01419 Encounter for gynecological examination (general) (routine) without abnormal findings: Secondary | ICD-10-CM | POA: Diagnosis not present

## 2021-08-21 ENCOUNTER — Other Ambulatory Visit: Payer: Self-pay | Admitting: Internal Medicine

## 2021-08-21 DIAGNOSIS — E785 Hyperlipidemia, unspecified: Secondary | ICD-10-CM

## 2021-08-26 ENCOUNTER — Telehealth: Payer: Self-pay | Admitting: Internal Medicine

## 2021-08-26 DIAGNOSIS — G5603 Carpal tunnel syndrome, bilateral upper limbs: Secondary | ICD-10-CM | POA: Diagnosis not present

## 2021-08-26 NOTE — Telephone Encounter (Signed)
Patient previously scheduled for labs 11/11 added on nurse visit after her lab appointment, note added to lab appointment.

## 2021-08-26 NOTE — Telephone Encounter (Signed)
Arianna labs 08/30/21  Pt wants shingrix 1st dose then too can you schedule this and let her know   Thank you

## 2021-08-27 ENCOUNTER — Encounter: Payer: Self-pay | Admitting: Internal Medicine

## 2021-08-30 ENCOUNTER — Other Ambulatory Visit: Payer: Self-pay

## 2021-08-30 ENCOUNTER — Other Ambulatory Visit (INDEPENDENT_AMBULATORY_CARE_PROVIDER_SITE_OTHER): Payer: BC Managed Care – PPO

## 2021-08-30 ENCOUNTER — Ambulatory Visit (INDEPENDENT_AMBULATORY_CARE_PROVIDER_SITE_OTHER): Payer: BC Managed Care – PPO

## 2021-08-30 DIAGNOSIS — Z1329 Encounter for screening for other suspected endocrine disorder: Secondary | ICD-10-CM

## 2021-08-30 DIAGNOSIS — I152 Hypertension secondary to endocrine disorders: Secondary | ICD-10-CM | POA: Diagnosis not present

## 2021-08-30 DIAGNOSIS — E1159 Type 2 diabetes mellitus with other circulatory complications: Secondary | ICD-10-CM

## 2021-08-30 DIAGNOSIS — Z23 Encounter for immunization: Secondary | ICD-10-CM | POA: Diagnosis not present

## 2021-08-30 LAB — HEMOGLOBIN A1C: Hgb A1c MFr Bld: 6.4 % (ref 4.6–6.5)

## 2021-08-30 LAB — COMPREHENSIVE METABOLIC PANEL
ALT: 21 U/L (ref 0–35)
AST: 20 U/L (ref 0–37)
Albumin: 4.4 g/dL (ref 3.5–5.2)
Alkaline Phosphatase: 38 U/L — ABNORMAL LOW (ref 39–117)
BUN: 18 mg/dL (ref 6–23)
CO2: 32 mEq/L (ref 19–32)
Calcium: 9.6 mg/dL (ref 8.4–10.5)
Chloride: 101 mEq/L (ref 96–112)
Creatinine, Ser: 0.79 mg/dL (ref 0.40–1.20)
GFR: 85.68 mL/min (ref >60.00)
Glucose, Bld: 144 mg/dL — ABNORMAL HIGH (ref 70–99)
Potassium: 3.7 mEq/L (ref 3.5–5.1)
Sodium: 141 mEq/L (ref 135–145)
Total Bilirubin: 0.4 mg/dL (ref 0.2–1.2)
Total Protein: 6.9 g/dL (ref 6.0–8.3)

## 2021-08-30 LAB — CBC WITH DIFFERENTIAL/PLATELET
Basophils Absolute: 0 10*3/uL (ref 0.0–0.1)
Basophils Relative: 0.5 % (ref 0.0–3.0)
Eosinophils Absolute: 0.1 10*3/uL (ref 0.0–0.7)
Eosinophils Relative: 2.3 % (ref 0.0–5.0)
HCT: 39 % (ref 36.0–46.0)
Hemoglobin: 13.2 g/dL (ref 12.0–15.0)
Lymphocytes Relative: 26.8 % (ref 12.0–46.0)
Lymphs Abs: 1.3 10*3/uL (ref 0.7–4.0)
MCHC: 33.7 g/dL (ref 30.0–36.0)
MCV: 89.8 fl (ref 78.0–100.0)
Monocytes Absolute: 0.3 10*3/uL (ref 0.1–1.0)
Monocytes Relative: 5.1 % (ref 3.0–12.0)
Neutro Abs: 3.3 10*3/uL (ref 1.4–7.7)
Neutrophils Relative %: 65.3 % (ref 43.0–77.0)
Platelets: 336 10*3/uL (ref 150.0–400.0)
RBC: 4.35 Mil/uL (ref 3.87–5.11)
RDW: 13.4 % (ref 11.5–15.5)
WBC: 5 10*3/uL (ref 4.0–10.5)

## 2021-08-30 LAB — TSH: TSH: 3.02 u[IU]/mL (ref 0.35–5.50)

## 2021-08-30 LAB — LIPID PANEL
Cholesterol: 154 mg/dL (ref 0–200)
HDL: 53.5 mg/dL (ref >39.00)
LDL Cholesterol: 76 mg/dL (ref 0–99)
NonHDL: 100.59
Total CHOL/HDL Ratio: 3
Triglycerides: 123 mg/dL (ref 0.0–149.0)
VLDL: 24.6 mg/dL (ref 0.0–40.0)

## 2021-08-30 NOTE — Progress Notes (Signed)
Patient presented for shingric injection to left deltoid, patient voiced no concerns nor showed any signs of distress during injection.

## 2021-09-02 ENCOUNTER — Encounter: Payer: Self-pay | Admitting: Internal Medicine

## 2021-09-02 DIAGNOSIS — G5603 Carpal tunnel syndrome, bilateral upper limbs: Secondary | ICD-10-CM | POA: Insufficient documentation

## 2021-09-02 DIAGNOSIS — M545 Low back pain, unspecified: Secondary | ICD-10-CM | POA: Insufficient documentation

## 2021-09-02 MED ORDER — LOSARTAN POTASSIUM-HCTZ 100-25 MG PO TABS
1.0000 | ORAL_TABLET | Freq: Every day | ORAL | 3 refills | Status: DC
Start: 2021-09-02 — End: 2021-11-19

## 2021-09-02 MED ORDER — PRAVASTATIN SODIUM 20 MG PO TABS: 20.0000 mg | ORAL_TABLET | Freq: Every day | ORAL | 3 refills | Status: DC

## 2021-09-02 MED ORDER — FLUOXETINE HCL 20 MG PO CAPS
20.0000 mg | ORAL_CAPSULE | Freq: Every day | ORAL | 3 refills | Status: DC
Start: 2021-09-02 — End: 2022-09-22

## 2021-09-02 MED ORDER — PANTOPRAZOLE SODIUM 40 MG PO TBEC: 40.0000 mg | DELAYED_RELEASE_TABLET | Freq: Every day | ORAL | 3 refills | Status: DC

## 2021-09-02 MED ORDER — GLIPIZIDE ER 2.5 MG PO TB24
2.5000 mg | ORAL_TABLET | Freq: Every day | ORAL | 3 refills | Status: DC
Start: 2021-09-02 — End: 2022-10-03

## 2021-09-02 NOTE — Progress Notes (Signed)
Cholesterol improved great work ! Liver kidneys, thyroid normal  A1C trending up 5.9 to 6.4 continue healthy diet and exercise  Blood cts normal

## 2021-09-05 DIAGNOSIS — M9901 Segmental and somatic dysfunction of cervical region: Secondary | ICD-10-CM | POA: Diagnosis not present

## 2021-09-05 DIAGNOSIS — R519 Headache, unspecified: Secondary | ICD-10-CM | POA: Diagnosis not present

## 2021-09-05 DIAGNOSIS — M5414 Radiculopathy, thoracic region: Secondary | ICD-10-CM | POA: Diagnosis not present

## 2021-09-05 DIAGNOSIS — M9902 Segmental and somatic dysfunction of thoracic region: Secondary | ICD-10-CM | POA: Diagnosis not present

## 2021-09-11 ENCOUNTER — Telehealth (INDEPENDENT_AMBULATORY_CARE_PROVIDER_SITE_OTHER): Payer: BC Managed Care – PPO | Admitting: Family Medicine

## 2021-09-11 ENCOUNTER — Encounter: Payer: Self-pay | Admitting: Family Medicine

## 2021-09-11 ENCOUNTER — Other Ambulatory Visit: Payer: Self-pay

## 2021-09-11 DIAGNOSIS — J0140 Acute pansinusitis, unspecified: Secondary | ICD-10-CM | POA: Diagnosis not present

## 2021-09-11 DIAGNOSIS — J329 Chronic sinusitis, unspecified: Secondary | ICD-10-CM | POA: Insufficient documentation

## 2021-09-11 MED ORDER — HYDROCOD POLST-CPM POLST ER 10-8 MG/5ML PO SUER: 5.0000 mL | Freq: Two times a day (BID) | ORAL | 0 refills | Status: DC | PRN

## 2021-09-11 MED ORDER — AMOXICILLIN-POT CLAVULANATE 875-125 MG PO TABS: 1.0000 | ORAL_TABLET | Freq: Two times a day (BID) | ORAL | 0 refills | Status: DC

## 2021-09-11 NOTE — Assessment & Plan Note (Signed)
Symptoms at this point seem most consistent with sinusitis.  I discussed certainly this could have started out as a viral illness or COVID though at this point she is outside of treatment ranges for COVID and flu.  Discussed that her negative home COVID test several days after onset of symptoms would make COVID less likely as well.  We will proceed with treatment for sinusitis.  Augmentin sent to pharmacy for her to take 1 tablet twice daily.  We will treat her cough with Tussionex.  Discussed risk of drowsiness with this.  If she is excessively drowsy in the morning after taking this she will discontinue it.  She was advised not to drive while taking this medication.  If she is not improving by early next week she will let us know.

## 2021-09-11 NOTE — Progress Notes (Signed)
Virtual Visit via video Note  This visit type was conducted due to national recommendations for restrictions regarding the COVID-19 pandemic (e.g. social distancing).  This format is felt to be most appropriate for this patient at this time.  All issues noted in this document were discussed and addressed.  No physical exam was performed (except for noted visual exam findings with Video Visits).   I connected with Angela Simon today at  3:15 PM EST by a video enabled telemedicine application and verified that I am speaking with the correct person using two identifiers. Location patient: home Location provider: work  Persons participating in the virtual visit: patient, provider, tom Cunningham (husband)  I discussed the limitations, risks, security and privacy concerns of performing an evaluation and management service by telephone and the availability of in person appointments. I also discussed with the patient that there may be a patient responsible charge related to this service. The patient expressed understanding and agreed to proceed.   Reason for visit: same day visit  HPI: Cough: Patient notes symptoms started on 09/05/2021.  Started with a scratchy throat that has progressed to a cough and significant right-sided sinus congestion.  She is blowing yellow mucus out of her nose.  She notes no fevers.  She does have postnasal drip.  No shortness of breath, loss of taste or smell, COVID exposure, or flu exposure.  She took a home COVID test on 09/09/2021 that was negative.  She has been taking Mucinex, ibuprofen, and using Chloraseptic over-the-counter.   ROS: See pertinent positives and negatives per HPI.  Past Medical History:  Diagnosis Date   Allergy    Carpal tunnel syndrome, bilateral    COVID-19    11/01/20   Episcleritis of right eye    08/22/20 lotemax 0.35 R eye tid x  1 week patty vision   Essential hypertension    Fatty liver    Frozen shoulder syndrome    left 11/2020 Dr.  Tamera Punt GO   GERD (gastroesophageal reflux disease)    Gestational diabetes    Hyperlipidemia    IBS (irritable bowel syndrome)    Kidney stones    x 3 occurrences    Melanoma ([place])    Obesity (BMI 30-39.9)    Renal disorder    Shingles     Past Surgical History:  Procedure Laterality Date   BREAST REDUCTION SURGERY     CARPAL TUNNEL RELEASE     09/17/21 right hand tbd left hand   CESAREAN SECTION     LITHOTRIPSY     x1   MELANOMA EXCISION     Right arm   NASAL SINUS SURGERY     TONSILLECTOMY      Family History  Problem Relation Age of Onset   Heart attack Mother    Hypertension Mother    Heart disease Mother        MI   Diabetes Father    Prostate cancer Father    Breast cancer Sister     SOCIAL HX: Non-smoker   Current Outpatient Medications:    amoxicillin-clavulanate (AUGMENTIN) 875-125 MG tablet, Take 1 tablet by mouth 2 (two) times daily., Disp: 14 tablet, Rfl: 0   bifidobacterium infantis (ALIGN) capsule, Take by mouth., Disp: , Rfl:    cetirizine (ZYRTEC) 10 MG tablet, Take 10 mg by mouth daily., Disp: , Rfl:    chlorpheniramine-HYDROcodone (TUSSIONEX PENNKINETIC ER) 10-8 MG/5ML SUER, Take 5 mLs by mouth every 12 (twelve) hours as needed for cough.,  Disp: 70 mL, Rfl: 0   FLUoxetine (PROZAC) 20 MG capsule, Take 1 capsule (20 mg total) by mouth daily. D/c tablet, Disp: 90 capsule, Rfl: 3   glipiZIDE (GLUCOTROL XL) 2.5 MG 24 hr tablet, Take 1 tablet (2.5 mg total) by mouth daily with breakfast., Disp: 90 tablet, Rfl: 3   LORazepam (ATIVAN) 0.5 MG tablet, Take 1 tablet (0.5 mg total) by mouth at bedtime as needed for anxiety., Disp: 30 tablet, Rfl: 0   losartan-hydrochlorothiazide (HYZAAR) 100-25 MG tablet, Take 1 tablet by mouth daily. In am, Disp: 90 tablet, Rfl: 3   Multiple Vitamin (MULTIVITAMIN) tablet, Take 1 tablet by mouth daily., Disp: , Rfl:    Omega-3 1000 MG CAPS, Take by mouth., Disp: , Rfl:    ondansetron (ZOFRAN) 4 MG tablet, Take 1 tablet (4  mg total) by mouth every 8 (eight) hours as needed for nausea or vomiting., Disp: 40 tablet, Rfl: 0   pantoprazole (PROTONIX) 40 MG tablet, Take 1 tablet (40 mg total) by mouth daily., Disp: 90 tablet, Rfl: 3   pravastatin (PRAVACHOL) 20 MG tablet, Take 1 tablet (20 mg total) by mouth daily. After 6 pm, Disp: 90 tablet, Rfl: 3   estradiol (VIVELLE-DOT) 0.0375 MG/24HR, Place onto the skin., Disp: , Rfl:   EXAM:  VITALS per patient if applicable:  GENERAL: alert, oriented, appears well and in no acute distress  HEENT: atraumatic, conjunttiva clear, no obvious abnormalities on inspection of external nose and ears  NECK: normal movements of the head and neck  LUNGS: on inspection no signs of respiratory distress, breathing rate appears normal, no obvious gross SOB, gasping or wheezing  CV: no obvious cyanosis  MS: moves all visible extremities without noticeable abnormality  PSYCH/NEURO: pleasant and cooperative, no obvious depression or anxiety, speech and thought processing grossly intact  ASSESSMENT AND PLAN:  Discussed the following assessment and plan:  Problem List Items Addressed This Visit     Sinusitis    Symptoms at this point seem most consistent with sinusitis.  I discussed certainly this could have started out as a viral illness or COVID though at this point she is outside of treatment ranges for COVID and flu.  Discussed that her negative home COVID test several days after onset of symptoms would make COVID less likely as well.  We will proceed with treatment for sinusitis.  Augmentin sent to pharmacy for her to take 1 tablet twice daily.  We will treat her cough with Tussionex.  Discussed risk of drowsiness with this.  If she is excessively drowsy in the morning after taking this she will discontinue it.  She was advised not to drive while taking this medication.  If she is not improving by early next week she will let us know.      Relevant Medications    amoxicillin-clavulanate (AUGMENTIN) 875-125 MG tablet   chlorpheniramine-HYDROcodone (TUSSIONEX PENNKINETIC ER) 10-8 MG/5ML SUER    No follow-ups on file.   I discussed the assessment and treatment plan with the patient. The patient was provided an opportunity to ask questions and all were answered. The patient agreed with the plan and demonstrated an understanding of the instructions.   The patient was advised to call back or seek an in-person evaluation if the symptoms worsen or if the condition fails to improve as anticipated.   Tommi Rumps, MD

## 2021-09-17 DIAGNOSIS — G5601 Carpal tunnel syndrome, right upper limb: Secondary | ICD-10-CM | POA: Diagnosis not present

## 2021-10-08 DIAGNOSIS — G5602 Carpal tunnel syndrome, left upper limb: Secondary | ICD-10-CM | POA: Diagnosis not present

## 2021-11-12 ENCOUNTER — Encounter: Payer: Self-pay | Admitting: Internal Medicine

## 2021-11-13 ENCOUNTER — Encounter: Payer: Self-pay | Admitting: Internal Medicine

## 2021-11-13 ENCOUNTER — Other Ambulatory Visit: Payer: Self-pay

## 2021-11-13 ENCOUNTER — Ambulatory Visit (INDEPENDENT_AMBULATORY_CARE_PROVIDER_SITE_OTHER): Payer: BC Managed Care – PPO | Admitting: Internal Medicine

## 2021-11-13 DIAGNOSIS — J0100 Acute maxillary sinusitis, unspecified: Secondary | ICD-10-CM | POA: Diagnosis not present

## 2021-11-13 MED ORDER — AMOXICILLIN-POT CLAVULANATE 875-125 MG PO TABS: 1.0000 | ORAL_TABLET | Freq: Two times a day (BID) | ORAL | 1 refills | Status: DC

## 2021-11-13 NOTE — Progress Notes (Signed)
Subjective:    Patient ID: Angela Simon, female    DOB: [birth year], 56 y.o.   MRN: [id number]  HPI Here due to respiratory infection  Started several days ago Scratchy, burning throat Stopped up on right side of face---now moving to the left also Bad cough---some pain with cough Rhinorrhea Facial pressure Slight productive cough---and slight yellow nasal discharge No fever No SOB Some ear pressure---bilateral  Has had past sinus problems  Using ibuprofen  Current Outpatient Medications on File Prior to Visit  Medication Sig Dispense Refill   bifidobacterium infantis (ALIGN) capsule Take by mouth.     cetirizine (ZYRTEC) 10 MG tablet Take 10 mg by mouth daily.     FLUoxetine (PROZAC) 20 MG capsule Take 1 capsule (20 mg total) by mouth daily. D/c tablet 90 capsule 3   glipiZIDE (GLUCOTROL XL) 2.5 MG 24 hr tablet Take 1 tablet (2.5 mg total) by mouth daily with breakfast. 90 tablet 3   LORazepam (ATIVAN) 0.5 MG tablet Take 1 tablet (0.5 mg total) by mouth at bedtime as needed for anxiety. 30 tablet 0   losartan-hydrochlorothiazide (HYZAAR) 100-25 MG tablet Take 1 tablet by mouth daily. In am 90 tablet 3   Multiple Vitamin (MULTIVITAMIN) tablet Take 1 tablet by mouth daily.     Omega-3 1000 MG CAPS Take by mouth.     ondansetron (ZOFRAN) 4 MG tablet Take 1 tablet (4 mg total) by mouth every 8 (eight) hours as needed for nausea or vomiting. 40 tablet 0   pantoprazole (PROTONIX) 40 MG tablet Take 1 tablet (40 mg total) by mouth daily. 90 tablet 3   pravastatin (PRAVACHOL) 20 MG tablet Take 1 tablet (20 mg total) by mouth daily. After 6 pm 90 tablet 3   estradiol (VIVELLE-DOT) 0.0375 MG/24HR Place onto the skin.     No current facility-administered medications on file prior to visit.    Allergies  Allergen Reactions   Metformin Other (See Comments)    Diarrhea Diarrhea Diarrhea    Metformin And Related     Diarrhea    Metoclopramide Other (See Comments)    Did  not work fatigue, weight gain, increased anxiety Did not work fatigue, weight gain, increased anxiety Did not work fatigue, weight gain, increased anxiety   Ozempic (0.25 Or 0.5 Mg-Dose) [Semaglutide(0.25 Or 0.5mg -Dos)]     Gastroparesis like sx's    Past Medical History:  Diagnosis Date   Allergy    Carpal tunnel syndrome, bilateral    COVID-19    11/01/20   Episcleritis of right eye    08/22/20 lotemax 0.35 R eye tid x  1 week patty vision   Essential hypertension    Fatty liver    Frozen shoulder syndrome    left 11/2020 Dr. Tamera Punt [facility]   GERD (gastroesophageal reflux disease)    Gestational diabetes    Hyperlipidemia    IBS (irritable bowel syndrome)    Kidney stones    x 3 occurrences    Melanoma ([place])    Obesity (BMI 30-39.9)    Renal disorder    Shingles     Past Surgical History:  Procedure Laterality Date   BREAST REDUCTION SURGERY     CARPAL TUNNEL RELEASE     09/17/21 right hand tbd left hand   CESAREAN SECTION     LITHOTRIPSY     x1   MELANOMA EXCISION     Right arm   NASAL SINUS SURGERY     TONSILLECTOMY  Family History  Problem Relation Age of Onset   Heart attack Mother    Hypertension Mother    Heart disease Mother        MI   Diabetes Father    Prostate cancer Father    Breast cancer Sister     Social History   Socioeconomic History   Marital status: Married    Spouse name: Tom   Number of children: 2   Years of education: Not on file   Highest education level: High school graduate  Occupational History   Not on file  Tobacco Use   Smoking status: Never   Smokeless tobacco: Never  Substance and Sexual Activity   Alcohol use: No   Drug use: No   Sexual activity: Not on file  Other Topics Concern   Not on file  Social History Narrative   Married since 19 y.o    2 children.   No grandchildren.   Self employed.   Enjoys shopping, decorating.    Social Determinants of Health   Financial Resource Strain: Not on file   Food Insecurity: Not on file  Transportation Needs: Not on file  Physical Activity: Not on file  Stress: Not on file  Social Connections: Not on file  Intimate Partner Violence: Not on file   Review of Systems No N/V Eating okay     Objective:   Physical Exam Constitutional:      Appearance: Normal appearance.  HENT:     Head:     Comments: Mild bilateral maxillary tenderness    Right Ear: Tympanic membrane and ear canal normal.     Left Ear: Tympanic membrane and ear canal normal.     Nose:     Comments: Mild congestion    Mouth/Throat:     Pharynx: No oropharyngeal exudate or posterior oropharyngeal erythema.  Neck:     Comments: Small non tender anterior cervical nodes Pulmonary:     Effort: Pulmonary effort is normal.     Breath sounds: Normal breath sounds. No wheezing or rales.  Musculoskeletal:     Cervical back: Neck supple.  Neurological:     Mental Status: She is alert.           Assessment & Plan:

## 2021-11-13 NOTE — Assessment & Plan Note (Signed)
May still be viral Discussed supportive care If worsens, fill augmentin 875 bid Rx

## 2021-11-18 ENCOUNTER — Encounter: Payer: Self-pay | Admitting: Internal Medicine

## 2021-11-18 ENCOUNTER — Other Ambulatory Visit: Payer: Self-pay | Admitting: Internal Medicine

## 2021-11-18 DIAGNOSIS — E1159 Type 2 diabetes mellitus with other circulatory complications: Secondary | ICD-10-CM

## 2021-11-18 DIAGNOSIS — I152 Hypertension secondary to endocrine disorders: Secondary | ICD-10-CM

## 2021-11-29 LAB — HM DIABETES EYE EXAM

## 2021-12-25 ENCOUNTER — Other Ambulatory Visit: Payer: Self-pay | Admitting: Internal Medicine

## 2021-12-25 DIAGNOSIS — K219 Gastro-esophageal reflux disease without esophagitis: Secondary | ICD-10-CM

## 2021-12-25 MED ORDER — PANTOPRAZOLE SODIUM 40 MG PO TBEC: 40.0000 mg | DELAYED_RELEASE_TABLET | Freq: Every day | ORAL | 3 refills | Status: DC

## 2022-01-06 ENCOUNTER — Encounter: Payer: Self-pay | Admitting: Internal Medicine

## 2022-01-06 ENCOUNTER — Other Ambulatory Visit: Payer: Self-pay

## 2022-01-06 DIAGNOSIS — K219 Gastro-esophageal reflux disease without esophagitis: Secondary | ICD-10-CM

## 2022-01-06 MED ORDER — PANTOPRAZOLE SODIUM 40 MG PO TBEC: 40.0000 mg | DELAYED_RELEASE_TABLET | Freq: Every day | ORAL | 3 refills | Status: DC

## 2022-01-09 ENCOUNTER — Other Ambulatory Visit: Payer: Self-pay

## 2022-01-09 ENCOUNTER — Ambulatory Visit (INDEPENDENT_AMBULATORY_CARE_PROVIDER_SITE_OTHER): Payer: BC Managed Care – PPO

## 2022-01-09 DIAGNOSIS — Z23 Encounter for immunization: Secondary | ICD-10-CM | POA: Diagnosis not present

## 2022-01-09 NOTE — Progress Notes (Signed)
Pt arrived for 2/2 shingrix injection, given in L deltoid. Pt tolerated injection well, showed no signs of distress nor voiced any concerns.  ?

## 2022-02-06 ENCOUNTER — Encounter: Payer: Self-pay | Admitting: Internal Medicine

## 2022-02-06 ENCOUNTER — Ambulatory Visit (INDEPENDENT_AMBULATORY_CARE_PROVIDER_SITE_OTHER): Payer: BC Managed Care – PPO | Admitting: Internal Medicine

## 2022-02-06 VITALS — BP 118/80 | HR 88 | Temp 98.7°F | Resp 14 | Ht 60.0 in | Wt 173.2 lb

## 2022-02-06 DIAGNOSIS — J309 Allergic rhinitis, unspecified: Secondary | ICD-10-CM

## 2022-02-06 DIAGNOSIS — Z1231 Encounter for screening mammogram for malignant neoplasm of breast: Secondary | ICD-10-CM

## 2022-02-06 DIAGNOSIS — I152 Hypertension secondary to endocrine disorders: Secondary | ICD-10-CM

## 2022-02-06 DIAGNOSIS — K219 Gastro-esophageal reflux disease without esophagitis: Secondary | ICD-10-CM

## 2022-02-06 DIAGNOSIS — I10 Essential (primary) hypertension: Secondary | ICD-10-CM

## 2022-02-06 DIAGNOSIS — E119 Type 2 diabetes mellitus without complications: Secondary | ICD-10-CM | POA: Diagnosis not present

## 2022-02-06 DIAGNOSIS — E781 Pure hyperglyceridemia: Secondary | ICD-10-CM

## 2022-02-06 DIAGNOSIS — K449 Diaphragmatic hernia without obstruction or gangrene: Secondary | ICD-10-CM

## 2022-02-06 DIAGNOSIS — Z0000 Encounter for general adult medical examination without abnormal findings: Secondary | ICD-10-CM

## 2022-02-06 DIAGNOSIS — K3184 Gastroparesis: Secondary | ICD-10-CM

## 2022-02-06 DIAGNOSIS — E785 Hyperlipidemia, unspecified: Secondary | ICD-10-CM

## 2022-02-06 DIAGNOSIS — E1159 Type 2 diabetes mellitus with other circulatory complications: Secondary | ICD-10-CM

## 2022-02-06 DIAGNOSIS — Z6833 Body mass index (BMI) 33.0-33.9, adult: Secondary | ICD-10-CM

## 2022-02-06 LAB — COMPREHENSIVE METABOLIC PANEL
ALT: 19 U/L (ref 0–35)
AST: 15 U/L (ref 0–37)
Albumin: 4.4 g/dL (ref 3.5–5.2)
Alkaline Phosphatase: 45 U/L (ref 39–117)
BUN: 17 mg/dL (ref 6–23)
CO2: 31 mEq/L (ref 19–32)
Calcium: 9.8 mg/dL (ref 8.4–10.5)
Chloride: 102 mEq/L (ref 96–112)
Creatinine, Ser: 0.85 mg/dL (ref 0.40–1.20)
GFR: 78.23 mL/min (ref >60.00)
Glucose, Bld: 113 mg/dL — ABNORMAL HIGH (ref 70–99)
Potassium: 3.9 mEq/L (ref 3.5–5.1)
Sodium: 141 mEq/L (ref 135–145)
Total Bilirubin: 0.5 mg/dL (ref 0.2–1.2)
Total Protein: 6.7 g/dL (ref 6.0–8.3)

## 2022-02-06 LAB — LDL CHOLESTEROL, DIRECT: Direct LDL: 97 mg/dL

## 2022-02-06 LAB — LIPID PANEL
Cholesterol: 185 mg/dL (ref 0–200)
HDL: 50.8 mg/dL (ref >39.00)
NonHDL: 134.68
Total CHOL/HDL Ratio: 4
Triglycerides: 286 mg/dL — ABNORMAL HIGH (ref 0.0–149.0)
VLDL: 57.2 mg/dL — ABNORMAL HIGH (ref 0.0–40.0)

## 2022-02-06 LAB — HEMOGLOBIN A1C: Hgb A1c MFr Bld: 6.6 % — ABNORMAL HIGH (ref 4.6–6.5)

## 2022-02-06 MED ORDER — SALINE SPRAY 0.65 % NA SOLN: 1.0000 | NASAL | 11 refills | Status: DC | PRN

## 2022-02-06 MED ORDER — LOSARTAN POTASSIUM-HCTZ 100-25 MG PO TABS: 1.0000 | ORAL_TABLET | Freq: Every morning | ORAL | 3 refills | Status: DC

## 2022-02-06 MED ORDER — LEVOCETIRIZINE DIHYDROCHLORIDE 5 MG PO TABS: 5.0000 mg | ORAL_TABLET | Freq: Every evening | ORAL | 3 refills | Status: DC | PRN

## 2022-02-06 MED ORDER — FLUTICASONE PROPIONATE 50 MCG/ACT NA SUSP
2.0000 | Freq: Every day | NASAL | 6 refills | Status: DC
Start: 2022-02-06 — End: 2022-10-03

## 2022-02-06 NOTE — Patient Instructions (Addendum)
Call for mammogram and pap  ? ?Dr. Havery Moros [facility]  ?Call for appt referral placed  ? ?MD Physician  ? ?Primary Contact Information ? ?Phone Fax E-mail Address  ?[phone] Not available [address]  ? [address]  ? [place] [address]  ? ? ?Food Choices for Gastroesophageal Reflux Disease, Adult ?When you have gastroesophageal reflux disease (GERD), the foods you eat and your eating habits are very important. Choosing the right foods can help ease the discomfort of GERD. Consider working with a dietitian to help you make healthy food choices. ?What are tips for following this plan? ?Reading food labels ?Look for foods that are low in saturated fat. Foods that have less than 5% of daily value (DV) of fat and 0 g of trans fats may help with your symptoms. ?Cooking ?Cook foods using methods other than frying. This may include baking, steaming, grilling, or broiling. These are all methods that do not need a lot of fat for cooking. ?To add flavor, try to use herbs that are low in spice and acidity. ?Meal planning ? ?Choose healthy foods that are low in fat, such as fruits, vegetables, whole grains, low-fat dairy products, lean meats, fish, and poultry. ?Eat frequent, small meals instead of three large meals each day. Eat your meals slowly, in a relaxed setting. Avoid bending over or lying down until 2-3 hours after eating. ?Limit high-fat foods such as fatty meats or fried foods. ?Limit your intake of fatty foods, such as oils, butter, and shortening. ?Avoid the following as told by your health care provider: ?Foods that cause symptoms. These may be different for different people. Keep a food diary to keep track of foods that cause symptoms. ?Alcohol. ?Drinking large amounts of liquid with meals. ?Eating meals during the 2-3 hours before bed. ?Lifestyle ?Maintain a healthy weight. Ask your health care provider what weight is healthy for you. If you need to lose weight, work with your health care  provider to do so safely. ?Exercise for at least 30 minutes on 5 or more days each week, or as told by your health care provider. ?Avoid wearing clothes that fit tightly around your waist and chest. ?Do not use any products that contain nicotine or tobacco. These products include cigarettes, chewing tobacco, and vaping devices, such as e-cigarettes. If you need help quitting, ask your health care provider. ?Sleep with the head of your bed raised. Use a wedge under the mattress or blocks under the bed frame to raise the head of the bed. ?Chew sugar-free gum after mealtimes. ?What foods should I eat? ? ?Eat a healthy, well-balanced diet of fruits, vegetables, whole grains, low-fat dairy products, lean meats, fish, and poultry. Each person is different. Foods that may trigger symptoms in one person may not trigger any symptoms in another person. Work with your health care provider to identify foods that are safe for you. ?The items listed above may not be a complete list of recommended foods and beverages. Contact a dietitian for more information. ?What foods should I avoid? ?Limiting some of these foods may help manage the symptoms of GERD. Everyone is different. Consult a dietitian or your health care provider to help you identify the exact foods to avoid, if any. ?Fruits ?Any fruits prepared with added fat. Any fruits that cause symptoms. For some people this may include citrus fruits, such as oranges, grapefruit, pineapple, and lemons. ?Vegetables ?Deep-fried vegetables. [place] fries. Any vegetables prepared with added fat. Any vegetables that  cause symptoms. For some people, this may include tomatoes and tomato products, chili peppers, onions and garlic, and horseradish. ?Grains ?Pastries or quick breads with added fat. ?Meats and other proteins ?High-fat meats, such as fatty beef or pork, hot dogs, ribs, ham, sausage, salami, and bacon. Fried meat or protein, including fried fish and fried chicken. Nuts and nut  butters, in large amounts. ?Dairy ?Whole milk and chocolate milk. Sour cream. Cream. Ice cream. Cream cheese. Milkshakes. ?Fats and oils ?Butter. Margarine. Shortening. Ghee. ?Beverages ?Coffee and tea, with or without caffeine. Carbonated beverages. Sodas. Energy drinks. Fruit juice made with acidic fruits, such as orange or grapefruit. Tomato juice. Alcoholic drinks. ?Sweets and desserts ?Chocolate and cocoa. Donuts. ?Seasonings and condiments ?Pepper. Peppermint and spearmint. Added salt. Any condiments, herbs, or seasonings that cause symptoms. For some people, this may include curry, hot sauce, or vinegar-based salad dressings. ?The items listed above may not be a complete list of foods and beverages to avoid. Contact a dietitian for more information. ?Questions to ask your health care provider ?Diet and lifestyle changes are usually the first steps that are taken to manage symptoms of GERD. If diet and lifestyle changes do not improve your symptoms, talk with your health care provider about taking medicines. ?Where to find more information ?International Foundation for Gastrointestinal Disorders: aboutgerd.org ?Summary ?When you have gastroesophageal reflux disease (GERD), food and lifestyle choices may be very helpful in easing the discomfort of GERD. ?Eat frequent, small meals instead of three large meals each day. Eat your meals slowly, in a relaxed setting. Avoid bending over or lying down until 2-3 hours after eating. ?Limit high-fat foods such as fatty meats or fried foods. ?This information is not intended to replace advice given to you by your health care provider. Make sure you discuss any questions you have with your health care provider. ?Document Revised: 04/16/2020 Document Reviewed: 04/16/2020 ?Elsevier Patient Education ? [employer]. ? ?Hiatal Hernia ? ?A hiatal hernia occurs when part of the stomach slides above the muscle that separates the abdomen from the chest (diaphragm). A person  can be born with a hiatal hernia (congenital), or it may develop over time. In almost all cases of hiatal hernia, only the top part of the stomach pushes through the diaphragm. ?Many people have a hiatal hernia with no symptoms. The larger the hernia, the more likely it is that you will have symptoms. In some cases, a hiatal hernia allows stomach acid to flow back into the tube that carries food from your mouth to your stomach (esophagus). This may cause heartburn symptoms. Severe heartburn symptoms may mean that you have developed a condition called gastroesophageal reflux disease (GERD). ?What are the causes? ?This condition is caused by a weakness in the opening (hiatus) where the esophagus passes through the diaphragm to attach to the upper part of the stomach. A person may be born with a weakness in the hiatus, or a weakness can develop over time. ?What increases the risk? ?This condition is more likely to develop in: ?Older people. Age is a major risk factor for a hiatal hernia, especially if you are over the age of 53. ?Pregnant women. ?People who are overweight. ?People who have frequent constipation. ?What are the signs or symptoms? ?Symptoms of this condition usually develop in the form of GERD symptoms. Symptoms include: ?Heartburn. ?Belching. ?Indigestion. ?Trouble swallowing. ?Coughing or wheezing. ?Sore throat. ?Hoarseness. ?Chest pain. ?Nausea and vomiting. ?How is this diagnosed? ?This condition may be diagnosed during testing  for GERD. Tests that may be done include: ?X-rays of your stomach or chest. ?An upper gastrointestinal (GI) series. This is an X-ray exam of your GI tract that is taken after you swallow a chalky liquid that shows up clearly on the X-ray. ?Endoscopy. This is a procedure to look into your stomach using a thin, flexible tube that has a tiny camera and light on the end of it. ?How is this treated? ?This condition may be treated by: ?Dietary and lifestyle changes to help reduce  GERD symptoms. ?Medicines. These may include: ?Over-the-counter antacids. ?Medicines that make your stomach empty more quickly. ?Medicines that block the production of stomach acid (H2 blockers). ?Stronger me

## 2022-02-06 NOTE — Progress Notes (Addendum)
Chief Complaint  Patient presents with   Follow-up    6 mon, no concerns feeling good overall.    Annual Exam   Annual doing well  1. Dm 2 history A1c improved on glip 2.5 mg xl qd no lows last A1c 6.4 trying healthy diet choices htn controlled on hyzaar 100-25 mg qd  2. Gerd/hh wants 2nd opinion currently on protonix 40 mg bid but still feels gastroparesis sxs  Will refer leb gi 3. C/o allergies and sneezing taking zyrtec otc   Review of Systems  Constitutional:  Negative for weight loss.  HENT:  Negative for hearing loss.   Eyes:  Negative for blurred vision.  Respiratory:  Negative for shortness of breath.   Cardiovascular:  Negative for chest pain.  Gastrointestinal:  Negative for abdominal pain and blood in stool.  Genitourinary:  Negative for dysuria.  Musculoskeletal:  Negative for falls and joint pain.  Skin:  Negative for rash.  Neurological:  Negative for headaches.  Psychiatric/Behavioral:  Negative for depression.   Past Medical History:  Diagnosis Date   Allergy    Carpal tunnel syndrome, bilateral    COVID-19    11/01/20   Episcleritis of right eye    08/22/20 lotemax 0.35 R eye tid x  1 week patty vision   Essential hypertension    Fatty liver    Frozen shoulder syndrome    left 11/2020 Dr. Tamera Punt [facility]   GERD (gastroesophageal reflux disease)    Gestational diabetes    Hyperlipidemia    IBS (irritable bowel syndrome)    Kidney stones    x 3 occurrences    Melanoma ([facility])    Obesity (BMI 30-39.9)    Renal disorder    Shingles    Past Surgical History:  Procedure Laterality Date   BREAST REDUCTION SURGERY     CARPAL TUNNEL RELEASE     09/17/21 right hand tbd left hand 09/2021 left Dr.Liu   CESAREAN SECTION     LITHOTRIPSY     x1   MELANOMA EXCISION     Right arm   NASAL SINUS SURGERY     TONSILLECTOMY     Family History  Problem Relation Age of Onset   Heart attack Mother    Hypertension Mother    Heart disease Mother        MI   Diabetes  Father    Prostate cancer Father    Breast cancer Sister    Social History   Socioeconomic History   Marital status: Married    Spouse name: Tom   Number of children: 2   Years of education: Not on file   Highest education level: High school graduate  Occupational History   Not on file  Tobacco Use   Smoking status: Never   Smokeless tobacco: Never  Substance and Sexual Activity   Alcohol use: No   Drug use: No   Sexual activity: Not on file  Other Topics Concern   Not on file  Social History Narrative   Married since 20 y.o    2 children.   No grandchildren.   Self employed.   Enjoys shopping, decorating.    Social Determinants of Health   Financial Resource Strain: Not on file  Food Insecurity: Not on file  Transportation Needs: Not on file  Physical Activity: Not on file  Stress: Not on file  Social Connections: Not on file  Intimate Partner Violence: Not on file   Current Meds  Medication Sig  bifidobacterium infantis (ALIGN) capsule Take by mouth.   cetirizine (ZYRTEC) 10 MG tablet Take 10 mg by mouth daily.   estradiol (VIVELLE-DOT) 0.0375 MG/24HR Place onto the skin.   FLUoxetine (PROZAC) 20 MG capsule Take 1 capsule (20 mg total) by mouth daily. D/c tablet   fluticasone (FLONASE) 50 MCG/ACT nasal spray Place 2 sprays into both nostrils daily. prn   glipiZIDE (GLUCOTROL XL) 2.5 MG 24 hr tablet Take 1 tablet (2.5 mg total) by mouth daily with breakfast.   levocetirizine (XYZAL ALLERGY 24HR) 5 MG tablet Take 1 tablet (5 mg total) by mouth at bedtime as needed for allergies.   LORazepam (ATIVAN) 0.5 MG tablet Take 1 tablet (0.5 mg total) by mouth at bedtime as needed for anxiety.   Multiple Vitamin (MULTIVITAMIN) tablet Take 1 tablet by mouth daily.   Omega-3 1000 MG CAPS Take by mouth.   pantoprazole (PROTONIX) 40 MG tablet Take 1 tablet (40 mg total) by mouth daily. 30 minutes before food   pravastatin (PRAVACHOL) 20 MG tablet Take 1 tablet (20 mg total) by  mouth daily. After 6 pm   sodium chloride (OCEAN) 0.65 % SOLN nasal spray Place 1 spray into both nostrils as needed for congestion.   [DISCONTINUED] losartan-hydrochlorothiazide (HYZAAR) 100-25 MG tablet TAKE 1 TABLET BY MOUTH ONCE DAILY IN THE MORNING   Allergies  Allergen Reactions   Metformin Other (See Comments)    Diarrhea Diarrhea Diarrhea    Metformin And Related     Diarrhea    Metoclopramide Other (See Comments)    Did not work fatigue, weight gain, increased anxiety Did not work fatigue, weight gain, increased anxiety Did not work fatigue, weight gain, increased anxiety   Ozempic (0.25 Or 0.5 Mg-Dose) [Semaglutide(0.25 Or 0.'5mg'$ -Dos)]     Gastroparesis like sx's   No results found for this or any previous visit (from the past 2160 hour(s)). Objective  Body mass index is 33.83 kg/m. Wt Readings from Last 3 Encounters:  02/06/22 173 lb 3.2 oz (78.6 kg)  11/13/21 172 lb (78 kg)  09/11/21 169 lb (76.7 kg)   Temp Readings from Last 3 Encounters:  02/06/22 98.7 F (37.1 C) (Oral)  11/13/21 97.6 F (36.4 C)  08/07/21 (!) 96.6 F (35.9 C)   BP Readings from Last 3 Encounters:  02/06/22 118/80  11/13/21 110/70  08/07/21 110/82   Pulse Readings from Last 3 Encounters:  02/06/22 88  11/13/21 92  08/07/21 84    Physical Exam Vitals and nursing note reviewed.  Constitutional:      Appearance: Normal appearance. She is well-developed and well-groomed.  HENT:     Head: Normocephalic and atraumatic.  Eyes:     Conjunctiva/sclera: Conjunctivae normal.     Pupils: Pupils are equal, round, and reactive to light.  Cardiovascular:     Rate and Rhythm: Normal rate and regular rhythm.     Heart sounds: Normal heart sounds. No murmur heard. Pulmonary:     Effort: Pulmonary effort is normal.     Breath sounds: Normal breath sounds.  Abdominal:     General: Abdomen is flat. Bowel sounds are normal.     Tenderness: There is no abdominal tenderness.  Musculoskeletal:         General: No tenderness.  Skin:    General: Skin is warm and dry.  Neurological:     General: No focal deficit present.     Mental Status: She is alert and oriented to person, place, and time. Mental status is  at baseline.     Cranial Nerves: Cranial nerves 2-12 are intact.     Motor: Motor function is intact.     Coordination: Coordination is intact.     Gait: Gait is intact.  Psychiatric:        Attention and Perception: Attention and perception normal.        Mood and Affect: Mood and affect normal.        Speech: Speech normal.        Behavior: Behavior normal. Behavior is cooperative.        Thought Content: Thought content normal.        Cognition and Memory: Cognition and memory normal.        Judgment: Judgment normal.    Assessment  Plan  Annual physical exam See below    Essential hypertension - Plan: Comprehensive metabolic panel, Lipid panel Controlled hyzaar 100/25 mg qd   Hypertriglyceridemia Hyperlipidemia, unspecified hyperlipidemia type Pravastatin 20 mg qhs   Type 2 diabetes mellitus without complication, without long-term current use of insulin (HCC) - Plan: Hemoglobin A1c 6.4 controlled foot exam today  Glip 2.5 xl qd   Hypertension associated with diabetes ([place]) - Plan: losartan-hydrochlorothiazide (HYZAAR) 100-25 MG tablet  Allergic rhinitis, unspecified seasonality, unspecified trigger - Plan: levocetirizine (XYZAL ALLERGY 24HR) 5 MG tablet, fluticasone (FLONASE) 50 MCG/ACT nasal spray, sodium chloride (OCEAN) 0.65 % SOLN nasal spray  gastroparesis Gastroesophageal reflux disease without esophagitis - Plan: Ambulatory referral to Gastroenterology Dr. Havery Moros Hiatal hernia - Plan: Ambulatory referral to Gastroenterology   HM Had flu utd Tdap 02/27/21  Hep A/B vx today 3/3 for fatty liver 3rd twinrix today 05/10/19 -hep B immune -check hep A immunity in the future  Neg hep C consider prevnar 13/20 or, pna 23 vaccine in the future   Declines covid h/o Covid 19 + Shingrix 2/2 utd  h/o shingles had   Pap 02/08/18 neg neg hpv disc est [facility] in future as of 02/05/21 Call and sch [facility]   mammo neg [facility] mammogram 05/17/20 negative Mammogram 05/21/21 negative    colonoscopy had 12/06/14 bx negative check GI when due will f/u with Dr. Earley [facility]  No FH colon cancer  Also had EGD in 2021/22 2nd opinion Dr. Havery Moros   Referred to Dr. Kellie Moor skin pt has not had derm appt as of 02/05/21 has # to call and schedule will do this Call to schedule derm 2023   rec healthy diet and exercise   Prior GI  [facility] [place] [phone] GERD  IBS, dysphagia/GERD/constipation  Plan: EGD prilosec 20 mg bid f/u in 8 weeks Dr. Esaw Dace 11/11/19 [facility] [place] EGD 11/29/19 erythema in duodenum 2nd part, erythema antrum hiatal hernia bxs taken neg, neg H pylori      03/01/21 Dr. Tamera Punt ortho f/u in [place] left frozen shoulder had 1/2 steroid shots Adhesive capsulitis of left shoulder   ROI patty vision 08/22/20  Neurology Dr. Rexford Maus rightand left 2022   Rec healthy diet and exercise   Provider: Dr. Olivia Mackie McLean-Scocuzza-Internal Medicine

## 2022-03-25 ENCOUNTER — Encounter: Payer: Self-pay | Admitting: Internal Medicine

## 2022-03-27 ENCOUNTER — Encounter: Payer: Self-pay | Admitting: Internal Medicine

## 2022-03-28 DIAGNOSIS — D0362 Melanoma in situ of left upper limb, including shoulder: Secondary | ICD-10-CM | POA: Diagnosis not present

## 2022-03-28 DIAGNOSIS — D485 Neoplasm of uncertain behavior of skin: Secondary | ICD-10-CM | POA: Diagnosis not present

## 2022-03-28 DIAGNOSIS — Z85820 Personal history of malignant melanoma of skin: Secondary | ICD-10-CM | POA: Diagnosis not present

## 2022-03-28 DIAGNOSIS — L814 Other melanin hyperpigmentation: Secondary | ICD-10-CM | POA: Diagnosis not present

## 2022-03-28 DIAGNOSIS — D2261 Melanocytic nevi of right upper limb, including shoulder: Secondary | ICD-10-CM | POA: Diagnosis not present

## 2022-03-28 DIAGNOSIS — D225 Melanocytic nevi of trunk: Secondary | ICD-10-CM | POA: Diagnosis not present

## 2022-04-04 ENCOUNTER — Encounter: Payer: Self-pay | Admitting: Internal Medicine

## 2022-04-24 DIAGNOSIS — D0362 Melanoma in situ of left upper limb, including shoulder: Secondary | ICD-10-CM | POA: Diagnosis not present

## 2022-05-12 ENCOUNTER — Encounter: Payer: Self-pay | Admitting: Internal Medicine

## 2022-05-20 NOTE — Telephone Encounter (Signed)
We can try moujaro sample 2.5 weekly and increase as tolerated but this is cousin of ozempic and if not tolerated rec STOP

## 2022-05-20 NOTE — Telephone Encounter (Signed)
LMOM for pt to CB in regards to the samples

## 2022-05-23 DIAGNOSIS — Z1231 Encounter for screening mammogram for malignant neoplasm of breast: Secondary | ICD-10-CM | POA: Diagnosis not present

## 2022-05-23 LAB — HM MAMMOGRAPHY

## 2022-06-08 ENCOUNTER — Encounter: Payer: Self-pay | Admitting: Internal Medicine

## 2022-06-10 ENCOUNTER — Encounter: Payer: Self-pay | Admitting: Internal Medicine

## 2022-06-12 ENCOUNTER — Other Ambulatory Visit: Payer: Self-pay | Admitting: Internal Medicine

## 2022-06-12 DIAGNOSIS — Z6833 Body mass index (BMI) 33.0-33.9, adult: Secondary | ICD-10-CM | POA: Insufficient documentation

## 2022-06-12 MED ORDER — TIRZEPATIDE 5 MG/0.5ML ~~LOC~~ SOAJ: 5.0000 mg | SUBCUTANEOUS | 2 refills | Status: DC

## 2022-06-12 NOTE — Addendum Note (Signed)
Addended by: Orland Mustard on: 06/12/2022 05:57 PM   Modules accepted: Orders

## 2022-06-30 ENCOUNTER — Encounter: Payer: Self-pay | Admitting: Internal Medicine

## 2022-06-30 DIAGNOSIS — R110 Nausea: Secondary | ICD-10-CM

## 2022-06-30 MED ORDER — ONDANSETRON HCL 4 MG PO TABS: 4.0000 mg | ORAL_TABLET | Freq: Three times a day (TID) | ORAL | 2 refills | Status: DC | PRN

## 2022-09-09 ENCOUNTER — Ambulatory Visit: Payer: BC Managed Care – PPO | Admitting: Internal Medicine

## 2022-09-19 ENCOUNTER — Telehealth: Payer: Self-pay | Admitting: Internal Medicine

## 2022-09-19 NOTE — Telephone Encounter (Signed)
Patient is requesting a refill on her FLUoxetine (PROZAC) 20 MG capsule.

## 2022-09-22 ENCOUNTER — Other Ambulatory Visit: Payer: Self-pay

## 2022-09-22 ENCOUNTER — Encounter: Payer: Self-pay | Admitting: Family

## 2022-09-22 DIAGNOSIS — F419 Anxiety disorder, unspecified: Secondary | ICD-10-CM

## 2022-09-22 MED ORDER — FLUOXETINE HCL 20 MG PO CAPS: 20.0000 mg | ORAL_CAPSULE | Freq: Every day | ORAL | 3 refills | Status: DC

## 2022-09-22 NOTE — Telephone Encounter (Signed)
Pt called in requesting refill on Prozac.  Refill was sent to pts pharmacy.

## 2022-09-22 NOTE — Telephone Encounter (Signed)
Refill has been sent.  °

## 2022-09-29 ENCOUNTER — Encounter: Payer: Self-pay | Admitting: Family

## 2022-09-29 NOTE — Telephone Encounter (Signed)
Spoke to pt and scheduled her an appt  to come into the office for 10/03/22

## 2022-10-03 ENCOUNTER — Encounter: Payer: Self-pay | Admitting: Family

## 2022-10-03 ENCOUNTER — Ambulatory Visit (INDEPENDENT_AMBULATORY_CARE_PROVIDER_SITE_OTHER): Payer: BC Managed Care – PPO | Admitting: Family

## 2022-10-03 VITALS — BP 102/70 | HR 90 | Temp 98.0°F | Resp 17 | Ht 60.0 in | Wt 160.0 lb

## 2022-10-03 DIAGNOSIS — I10 Essential (primary) hypertension: Secondary | ICD-10-CM

## 2022-10-03 DIAGNOSIS — R350 Frequency of micturition: Secondary | ICD-10-CM | POA: Diagnosis not present

## 2022-10-03 DIAGNOSIS — R300 Dysuria: Secondary | ICD-10-CM | POA: Diagnosis not present

## 2022-10-03 LAB — URINALYSIS, ROUTINE W REFLEX MICROSCOPIC
Bilirubin Urine: NEGATIVE
Hgb urine dipstick: NEGATIVE
Ketones, ur: NEGATIVE
Leukocytes,Ua: NEGATIVE
Nitrite: NEGATIVE
Specific Gravity, Urine: 1.01 (ref 1.000–1.030)
Total Protein, Urine: NEGATIVE
Urine Glucose: NEGATIVE
Urobilinogen, UA: 0.2 (ref 0.0–1.0)
pH: 6 (ref 5.0–8.0)

## 2022-10-03 LAB — POC URINALSYSI DIPSTICK (AUTOMATED)
Bilirubin, UA: NEGATIVE
Blood, UA: NEGATIVE
Glucose, UA: NEGATIVE
Ketones, UA: NEGATIVE
Leukocytes, UA: NEGATIVE
Nitrite, UA: NEGATIVE
Protein, UA: NEGATIVE
Spec Grav, UA: 1.01 (ref 1.010–1.025)
Urobilinogen, UA: 0.2 E.U./dL
pH, UA: 5.5 (ref 5.0–8.0)

## 2022-10-03 MED ORDER — LOSARTAN POTASSIUM-HCTZ 50-12.5 MG PO TABS: 1.0000 | ORAL_TABLET | Freq: Every day | ORAL | 1 refills | Status: DC

## 2022-10-03 MED ORDER — NITROFURANTOIN MONOHYD MACRO 100 MG PO CAPS: 100.0000 mg | ORAL_CAPSULE | Freq: Two times a day (BID) | ORAL | 0 refills | Status: DC

## 2022-10-03 NOTE — Assessment & Plan Note (Signed)
Afebrile.  Urine point-of-care is negative nitrites, leukocytes blood.  Pending urine culture.  Patient opted to start Macrobid empirically ahead of urine culture.  She will continue probiotics.

## 2022-10-03 NOTE — Patient Instructions (Signed)
Start macrobid  Ensure to take probiotics while on antibiotics and also for 2 weeks after completion. This can either be by eating yogurt daily or taking a probiotic supplement over the counter such as Culturelle.It is important to re-colonize the gut with good bacteria and also to prevent any diarrheal infections associated with antibiotic use.

## 2022-10-03 NOTE — Assessment & Plan Note (Addendum)
Fortunately asymptomatic,BP  low end of normal.  Patient discussed in the setting of weight loss, that it would be prudent to go ahead and decrease Hyzaar.  Patient has a blood pressure cuff at home and will monitor blood pressure.  start Hyzaar 50-12.5 mg.

## 2022-10-03 NOTE — Progress Notes (Signed)
Assessment & Plan:  Urinary frequency Assessment & Plan: Afebrile.  Urine point-of-care is negative nitrites, leukocytes blood.  Pending urine culture.  Patient opted to start Macrobid empirically ahead of urine culture.  She will continue probiotics.  Orders: -     Urinalysis, Routine w reflex microscopic  Dysuria -     POCT Urinalysis Dipstick (Automated) -     Urine Culture -     Nitrofurantoin Monohyd Macro; Take 1 capsule (100 mg total) by mouth 2 (two) times daily. Take with food.  Dispense: 10 capsule; Refill: 0  Essential hypertension Assessment & Plan: Fortunately asymptomatic,BP  low end of normal.  Patient discussed in the setting of weight loss, that it would be prudent to go ahead and decrease Hyzaar.  Patient has a blood pressure cuff at home and will monitor blood pressure.  start Hyzaar 50-12.5 mg.  Orders: -     Losartan Potassium-HCTZ; Take 1 tablet by mouth daily.  Dispense: 90 tablet; Refill: 1     Return precautions given.   Risks, benefits, and alternatives of the medications and treatment plan prescribed today were discussed, and patient expressed understanding.   Education regarding symptom management and diagnosis given to patient on AVS either electronically or printed.  No follow-ups on file.  [name], FNP  Subjective:    Patient ID: Angela Simon, female    DOB: [birth year], 56 y.o.   MRN: [id number]  CC: Angela Simon is a 56 y.o. female who presents today for an acute visit.    HPI: Complains of urinary frequency x 1 week She has been on azo with relief. Slight burning, slight suprapubic pressure.   H/o renal stone.  No h/o CKD  Patient compliant with Hyzaar.  Since losing weight on Mounjaro, she has noticed that her blood pressure is lower.  Denies dizziness    Allergies: Metformin, Metformin and related, Metoclopramide, and Ozempic (0.25 or 0.5 mg-dose) [semaglutide(0.25 or 0.'5mg'$ -dos)] Current Outpatient  Medications on File Prior to Visit  Medication Sig Dispense Refill   bifidobacterium infantis (ALIGN) capsule Take by mouth.     cetirizine (ZYRTEC) 10 MG tablet Take 10 mg by mouth daily.     FLUoxetine (PROZAC) 20 MG capsule Take 1 capsule (20 mg total) by mouth daily. D/c tablet 90 capsule 3   levocetirizine (XYZAL ALLERGY 24HR) 5 MG tablet Take 1 tablet (5 mg total) by mouth at bedtime as needed for allergies. 90 tablet 3   Multiple Vitamin (MULTIVITAMIN) tablet Take 1 tablet by mouth daily.     Omega-3 1000 MG CAPS Take by mouth.     ondansetron (ZOFRAN) 4 MG tablet Take 1 tablet (4 mg total) by mouth every 8 (eight) hours as needed for nausea or vomiting. 40 tablet 2   pantoprazole (PROTONIX) 40 MG tablet Take 1 tablet (40 mg total) by mouth daily. 30 minutes before food 90 tablet 3   pravastatin (PRAVACHOL) 20 MG tablet Take 1 tablet (20 mg total) by mouth daily. After 6 pm 90 tablet 3   sodium chloride (OCEAN) 0.65 % SOLN nasal spray Place 1 spray into both nostrils as needed for congestion. 30 mL 11   tirzepatide (MOUNJARO) 5 MG/0.5ML Pen Inject 5 mg into the skin once a week. 6 mL 2   estradiol (VIVELLE-DOT) 0.0375 MG/24HR Place onto the skin.     No current facility-administered medications on file prior to visit.    Review of Systems  Constitutional:  Negative for chills and fever.  Respiratory:  Negative for cough.   Cardiovascular:  Negative for chest pain and palpitations.  Gastrointestinal:  Negative for nausea and vomiting.  Genitourinary:  Positive for dysuria and frequency.      Objective:    BP 102/70   Pulse 90   Temp 98 F (36.7 C) (Oral)   Resp 17   Ht 5' (1.524 m)   Wt 160 lb (72.6 kg)   SpO2 96%   BMI 31.25 kg/m   BP Readings from Last 3 Encounters:  10/03/22 102/70  02/06/22 118/80  11/13/21 110/70   Wt Readings from Last 3 Encounters:  10/03/22 160 lb (72.6 kg)  02/06/22 173 lb 3.2 oz (78.6 kg)  11/13/21 172 lb (78 kg)    Physical  Exam Vitals reviewed.  Constitutional:      Appearance: She is well-developed.  Cardiovascular:     Rate and Rhythm: Normal rate and regular rhythm.     Pulses: Normal pulses.     Heart sounds: Normal heart sounds.  Pulmonary:     Effort: Pulmonary effort is normal.     Breath sounds: Normal breath sounds. No wheezing, rhonchi or rales.  Skin:    General: Skin is warm and dry.  Neurological:     Mental Status: She is alert.  Psychiatric:        Speech: Speech normal.        Behavior: Behavior normal.        Thought Content: Thought content normal.

## 2022-10-04 LAB — URINE CULTURE
MICRO NUMBER:: 14321561
Result:: NO GROWTH
SPECIMEN QUALITY:: ADEQUATE

## 2022-10-06 ENCOUNTER — Encounter: Payer: Self-pay | Admitting: Family

## 2022-10-08 ENCOUNTER — Other Ambulatory Visit: Payer: Self-pay

## 2022-10-08 ENCOUNTER — Encounter: Payer: Self-pay | Admitting: Family

## 2022-10-08 DIAGNOSIS — E1159 Type 2 diabetes mellitus with other circulatory complications: Secondary | ICD-10-CM

## 2022-10-08 DIAGNOSIS — E785 Hyperlipidemia, unspecified: Secondary | ICD-10-CM

## 2022-10-08 MED ORDER — PRAVASTATIN SODIUM 20 MG PO TABS: 20.0000 mg | ORAL_TABLET | Freq: Every day | ORAL | 3 refills | Status: DC

## 2022-10-17 ENCOUNTER — Encounter: Payer: Self-pay | Admitting: Family

## 2022-10-21 ENCOUNTER — Ambulatory Visit (INDEPENDENT_AMBULATORY_CARE_PROVIDER_SITE_OTHER): Payer: BC Managed Care – PPO

## 2022-10-21 ENCOUNTER — Encounter: Payer: Self-pay | Admitting: Family

## 2022-10-21 ENCOUNTER — Ambulatory Visit (INDEPENDENT_AMBULATORY_CARE_PROVIDER_SITE_OTHER): Payer: BC Managed Care – PPO | Admitting: Family

## 2022-10-21 VITALS — BP 118/78 | HR 94 | Temp 98.3°F | Ht 59.0 in | Wt 157.2 lb

## 2022-10-21 DIAGNOSIS — R140 Abdominal distension (gaseous): Secondary | ICD-10-CM

## 2022-10-21 DIAGNOSIS — E119 Type 2 diabetes mellitus without complications: Secondary | ICD-10-CM | POA: Diagnosis not present

## 2022-10-21 DIAGNOSIS — J40 Bronchitis, not specified as acute or chronic: Secondary | ICD-10-CM | POA: Insufficient documentation

## 2022-10-21 DIAGNOSIS — R051 Acute cough: Secondary | ICD-10-CM | POA: Diagnosis not present

## 2022-10-21 LAB — COMPREHENSIVE METABOLIC PANEL
ALT: 20 U/L (ref 0–35)
AST: 18 U/L (ref 0–37)
Albumin: 4.4 g/dL (ref 3.5–5.2)
Alkaline Phosphatase: 49 U/L (ref 39–117)
BUN: 12 mg/dL (ref 6–23)
CO2: 30 mEq/L (ref 19–32)
Calcium: 9.7 mg/dL (ref 8.4–10.5)
Chloride: 100 mEq/L (ref 96–112)
Creatinine, Ser: 0.83 mg/dL (ref 0.40–1.20)
GFR: 80.1 mL/min (ref >60.00)
Glucose, Bld: 76 mg/dL (ref 70–99)
Potassium: 4.1 mEq/L (ref 3.5–5.1)
Sodium: 140 mEq/L (ref 135–145)
Total Bilirubin: 0.4 mg/dL (ref 0.2–1.2)
Total Protein: 6.9 g/dL (ref 6.0–8.3)

## 2022-10-21 LAB — LIPID PANEL
Cholesterol: 177 mg/dL (ref 0–200)
HDL: 49.2 mg/dL (ref >39.00)
LDL Cholesterol: 97 mg/dL (ref 0–99)
NonHDL: 128.26
Total CHOL/HDL Ratio: 4
Triglycerides: 155 mg/dL — ABNORMAL HIGH (ref 0.0–149.0)
VLDL: 31 mg/dL (ref 0.0–40.0)

## 2022-10-21 LAB — URINALYSIS, ROUTINE W REFLEX MICROSCOPIC
Hgb urine dipstick: NEGATIVE
Leukocytes,Ua: NEGATIVE
Nitrite: NEGATIVE
Specific Gravity, Urine: 1.015 (ref 1.000–1.030)
Urine Glucose: NEGATIVE
Urobilinogen, UA: 0.2 (ref 0.0–1.0)
pH: 7.5 (ref 5.0–8.0)

## 2022-10-21 LAB — TSH: TSH: 2.3 u[IU]/mL (ref 0.35–5.50)

## 2022-10-21 LAB — POC COVID19 BINAXNOW: SARS Coronavirus 2 Ag: NEGATIVE

## 2022-10-21 LAB — HEMOGLOBIN A1C: Hgb A1c MFr Bld: 5.7 % (ref 4.6–6.5)

## 2022-10-21 LAB — MICROALBUMIN / CREATININE URINE RATIO
Creatinine,U: 399.1 mg/dL
Microalb Creat Ratio: 0.7 mg/g (ref 0.0–30.0)
Microalb, Ur: 2.7 mg/dL — ABNORMAL HIGH (ref 0.0–1.9)

## 2022-10-21 LAB — POCT INFLUENZA A/B
Influenza A, POC: NEGATIVE
Influenza B, POC: NEGATIVE

## 2022-10-21 NOTE — Assessment & Plan Note (Signed)
COVID-negative today.  Suspect viral in nature.  Patient will let me know if symptoms persist

## 2022-10-21 NOTE — Assessment & Plan Note (Addendum)
Differentials include constipation ( exacerbated by h/o gastroparesis and mounjaro), ovarian pathology.  Less likely interstitial cystitis however consider urology consult if urinary symptoms present. Advised to decrease mounjaro to 2.'5mg'$  or discontinue to see if symptoms improve. If Ab XR unrevealing for constipation, advised patient to have thorough GYN evaluation including pelvic US to exclude ovarian pathology. Pending labs, urine studies.

## 2022-10-21 NOTE — Patient Instructions (Addendum)
I am concerned that mounjaro is exacerbating constipation.  Please either trial stop medication altogether or decrease to 2.5 mg.  If decreasing medication does not help her symptoms, I recommend thorough GYN evaluation including pelvic ultrasound to evaluate for ovarian.

## 2022-10-21 NOTE — Progress Notes (Signed)
Assessment & Plan:  Abdominal distention Assessment & Plan: Differentials include constipation ( exacerbated by h/o gastroparesis and mounjaro), ovarian pathology.  Less likely interstitial cystitis however consider urology consult if urinary symptoms present. Advised to decrease mounjaro to 2.'5mg'$  or discontinue to see if symptoms improve. If Ab XR unrevealing for constipation, advised patient to have thorough GYN evaluation including pelvic US to exclude ovarian pathology. Pending labs, urine studies.   Orders: -     TSH -     Urinalysis, Routine w reflex microscopic -     Urine Culture -     DG Abd 1 View -     Celiac Disease Ab Screen w/Rfx  Bronchitis Assessment & Plan: COVID-negative today.  Suspect viral in nature.  Patient will let me know if symptoms persist   Type 2 diabetes mellitus without complication, without long-term current use of insulin (HCC) -     Hemoglobin A1c -     Comprehensive metabolic panel -     Lipid panel -     Microalbumin / creatinine urine ratio  Acute cough -     POC COVID-19 BinaxNow -     POCT Influenza A/B     Return precautions given.   Risks, benefits, and alternatives of the medications and treatment plan prescribed today were discussed, and patient expressed understanding.   Education regarding symptom management and diagnosis given to patient on AVS either electronically or printed.  Return in about 1 month (around 11/21/2022).  Angela Paris, FNP  Subjective:    Patient ID: Angela Simon, female    DOB: Apr 23, 1966, 57 y.o.   MRN: [id number]  CC: Angela Simon is a 57 y.o. female who presents today for an acute visit.    HPI: Complains sinus congestion x 5 days Occasional cough.  No fever. Taking ibuprofen   Complains of lower constant abdominal cramping She also feels it like 'pressure'. No improvement with BM.  Endorses excessive gas and burping  No relief from ibuprofen.  BM every day however she notes  scant amount. H/o gastroparesis. Formed brown BM. She is taking magnesium which has helped sp she is not straining.  She continues to have dysuria when urinating.  She is compliant with Estrace No vaginal bleeding, n, saddle anesthesia.   History of diabetes, IBS, GERD   She is compliant with Mounjaro '5mg'$   seen 2 weeks ago and for urinary frequency.  Started on Macrobid empirically.  Urine culture resulted bland.  GYN appt this week to establish care   Allergies: Metformin, Metformin and related, Metoclopramide, and Ozempic (0.25 or 0.5 mg-dose) [semaglutide(0.25 or 0.'5mg'$ -dos)] Current Outpatient Medications on File Prior to Visit  Medication Sig Dispense Refill   bifidobacterium infantis (ALIGN) capsule Take by mouth.     cetirizine (ZYRTEC) 10 MG tablet Take 10 mg by mouth daily.     FLUoxetine (PROZAC) 20 MG capsule Take 1 capsule (20 mg total) by mouth daily. D/c tablet 90 capsule 3   levocetirizine (XYZAL ALLERGY 24HR) 5 MG tablet Take 1 tablet (5 mg total) by mouth at bedtime as needed for allergies. 90 tablet 3   losartan-hydrochlorothiazide (HYZAAR) 50-12.5 MG tablet Take 1 tablet by mouth daily. 90 tablet 1   Multiple Vitamin (MULTIVITAMIN) tablet Take 1 tablet by mouth daily.     Omega-3 1000 MG CAPS Take by mouth.     ondansetron (ZOFRAN) 4 MG tablet Take 1 tablet (4 mg total) by mouth every 8 (eight) hours as needed for  nausea or vomiting. 40 tablet 2   pantoprazole (PROTONIX) 40 MG tablet Take 1 tablet (40 mg total) by mouth daily. 30 minutes before food 90 tablet 3   pravastatin (PRAVACHOL) 20 MG tablet Take 1 tablet (20 mg total) by mouth daily. After 6 pm 90 tablet 3   sodium chloride (OCEAN) 0.65 % SOLN nasal spray Place 1 spray into both nostrils as needed for congestion. 30 mL 11   tirzepatide (MOUNJARO) 5 MG/0.5ML Pen Inject 5 mg into the skin once a week. 6 mL 2   estradiol (VIVELLE-DOT) 0.0375 MG/24HR Place onto the skin.     No current facility-administered  medications on file prior to visit.    Review of Systems  Constitutional:  Negative for chills and fever.  HENT:  Positive for congestion.   Respiratory:  Positive for cough.   Cardiovascular:  Negative for chest pain and palpitations.  Gastrointestinal:  Positive for abdominal distention and abdominal pain. Negative for blood in stool, nausea and vomiting.      Objective:    BP 118/78   Pulse 94   Temp 98.3 F (36.8 C) (Oral)   Ht '4\' 11"'$  (1.499 m)   Wt 157 lb 3.2 oz (71.3 kg)   LMP  (LMP Unknown)   SpO2 99%   BMI 31.75 kg/m   BP Readings from Last 3 Encounters:  10/21/22 118/78  10/03/22 102/70  02/06/22 118/80   Wt Readings from Last 3 Encounters:  10/21/22 157 lb 3.2 oz (71.3 kg)  10/03/22 160 lb (72.6 kg)  02/06/22 173 lb 3.2 oz (78.6 kg)    Physical Exam Vitals reviewed.  Constitutional:      Appearance: Normal appearance. She is well-developed.  Eyes:     Conjunctiva/sclera: Conjunctivae normal.  Cardiovascular:     Rate and Rhythm: Normal rate and regular rhythm.     Pulses: Normal pulses.     Heart sounds: Normal heart sounds.  Pulmonary:     Effort: Pulmonary effort is normal.     Breath sounds: Normal breath sounds. No wheezing, rhonchi or rales.  Abdominal:     General: Bowel sounds are normal. There is no distension.     Palpations: Abdomen is soft. Abdomen is not rigid. There is no fluid wave or mass.     Tenderness: There is abdominal tenderness in the suprapubic area. There is no guarding or rebound.     Comments: Slight discomfort noted with deep palpation suprapubic area.  No guarding.  No rebound  Musculoskeletal:     Lumbar back: No swelling, edema, spasms, tenderness or bony tenderness. Normal range of motion.     Comments: Full range of motion with flexion, tension, lateral side bends. No bony tenderness. No pain, numbness, tingling elicited with single leg raise bilaterally.   Skin:    General: Skin is warm and dry.  Neurological:      Mental Status: She is alert.     Sensory: No sensory deficit.     Deep Tendon Reflexes:     Reflex Scores:      Patellar reflexes are 2+ on the right side and 2+ on the left side.    Comments: Sensation and strength intact bilateral lower extremities.  Psychiatric:        Speech: Speech normal.        Behavior: Behavior normal.        Thought Content: Thought content normal.

## 2022-10-22 ENCOUNTER — Encounter: Payer: Self-pay | Admitting: Family

## 2022-10-22 LAB — URINE CULTURE
MICRO NUMBER:: 14377223
Result:: NO GROWTH
SPECIMEN QUALITY:: ADEQUATE

## 2022-10-24 LAB — CELIAC DISEASE AB SCREEN W/RFX
Antigliadin Abs, IgA: 13 units (ref 0–19)
IgA/Immunoglobulin A, Serum: 440 mg/dL — ABNORMAL HIGH (ref 87–352)
Transglutaminase IgA: <2 U/mL (ref 0–3)

## 2022-11-07 ENCOUNTER — Encounter: Payer: BC Managed Care – PPO | Admitting: Family

## 2022-11-20 ENCOUNTER — Encounter: Payer: Self-pay | Admitting: Family

## 2022-11-20 ENCOUNTER — Ambulatory Visit: Payer: BC Managed Care – PPO | Admitting: Nurse Practitioner

## 2022-11-28 ENCOUNTER — Ambulatory Visit: Payer: BC Managed Care – PPO | Admitting: Family

## 2022-12-04 ENCOUNTER — Encounter: Payer: Self-pay | Admitting: Family

## 2022-12-04 ENCOUNTER — Other Ambulatory Visit: Payer: Self-pay

## 2022-12-04 DIAGNOSIS — K219 Gastro-esophageal reflux disease without esophagitis: Secondary | ICD-10-CM

## 2022-12-04 MED ORDER — PANTOPRAZOLE SODIUM 40 MG PO TBEC: 40.0000 mg | DELAYED_RELEASE_TABLET | Freq: Every day | ORAL | 3 refills | Status: DC

## 2022-12-04 NOTE — Telephone Encounter (Signed)
sent

## 2023-02-16 ENCOUNTER — Other Ambulatory Visit: Payer: Self-pay

## 2023-02-16 DIAGNOSIS — J309 Allergic rhinitis, unspecified: Secondary | ICD-10-CM

## 2023-02-16 MED ORDER — LEVOCETIRIZINE DIHYDROCHLORIDE 5 MG PO TABS: 5.0000 mg | ORAL_TABLET | Freq: Every evening | ORAL | 3 refills | Status: DC | PRN

## 2023-02-16 NOTE — Telephone Encounter (Signed)
Please advise patient left message to call office the medication has been called in.

## 2023-02-16 NOTE — Addendum Note (Signed)
Addended by: Dennie Bible on: 02/16/2023 10:08 AM   Modules accepted: Orders

## 2023-02-16 NOTE — Telephone Encounter (Signed)
Office received a refill request for Levocetirizine 5mg  from [facility] on [address].  Pt has canceled last 3 appointments (11/07/22, 11/20/22, 11/28/22) Called today to set up a TOC appointment, pt reported that her appointment on 10/03/22 was supposed to be her annual physical.  She reported not being "happy" with Margaret's care.  Pt described Claris Che as "being all over the place." I offered to set pt up with a different provider that was accepting new pts and she reported that she did not under stand why she would need to be seen again.  I told her that I'd have our Practice Administrator call her.  Pt verbalized understanding.

## 2023-02-16 NOTE — Telephone Encounter (Signed)
Talked with patient who is very upset. " I do not feel I was treated fairly due to I assumed when I was advised to come in earlier for my appointment that this was my physical. I do  not understand why provider did physical labs if not my physical. Provider never advised it was not a physical and an acute visit. The labs that were completed had nothing to do with my issues. I am very unsatisfied being told I do not have a PCP. Dr. Shirlee Latch would have made this my physical and I would not have had to pay for any labs. Now, I have this huge bill from [facility] and my insurance will not pay. I would lie a new provider but I am not sure I want to come there now"  I advised patient I could ask provider to fill prescription and could offer a different provider that patient needs continued care for diabetes and for hypertension. I ask patient what would resolve or should the resolution be to make things better? " I am not sure first my prescription, but I am not sure if I want to come there due to the care I received." I advised I would try to get script approved and that patient could view the recommended providers profiles and reviews and if decided to remain at our could call back and schedule TOC. I have pended medication can it be filled.

## 2023-02-18 NOTE — Telephone Encounter (Signed)
Patient returned call advised patient medication called to pharmacy. Patient ask about her other medications  would she need to establish if she needed a refill advised patient yes since she was unhappy with provider seen. I advised we could do courtesy refill until appointment.

## 2023-02-18 NOTE — Telephone Encounter (Signed)
Pt returned [name] RN call. Note below was read to her. Pt aware and understood. Call was also transferred to [facility], [place].

## 2023-02-20 ENCOUNTER — Other Ambulatory Visit: Payer: Self-pay

## 2023-02-20 DIAGNOSIS — E1159 Type 2 diabetes mellitus with other circulatory complications: Secondary | ICD-10-CM

## 2023-02-20 DIAGNOSIS — I10 Essential (primary) hypertension: Secondary | ICD-10-CM

## 2023-02-20 DIAGNOSIS — E781 Pure hyperglyceridemia: Secondary | ICD-10-CM

## 2023-02-20 DIAGNOSIS — Z6833 Body mass index (BMI) 33.0-33.9, adult: Secondary | ICD-10-CM

## 2023-02-20 DIAGNOSIS — E785 Hyperlipidemia, unspecified: Secondary | ICD-10-CM

## 2023-02-20 DIAGNOSIS — E119 Type 2 diabetes mellitus without complications: Secondary | ICD-10-CM

## 2023-02-20 MED ORDER — TIRZEPATIDE 5 MG/0.5ML ~~LOC~~ SOAJ: 5.0000 mg | SUBCUTANEOUS | 0 refills | Status: DC

## 2023-02-20 NOTE — Telephone Encounter (Signed)
Prescription Request  02/20/2023  LOV: Visit date not found  What is the name of the medication or equipment? tirzepatide [facility]) 5 MG/0.5ML Pen  Have you contacted your pharmacy to request a refill? No   Which pharmacy would you like this sent to?   [facility] [address], [place] - [address] [place] [address] Phone: [phone] Fax: [phone]  Patient notified that their request is being sent to the clinical staff for review and that they should receive a response within 2 business days.   Please advise at Mobile [phone] (mobile)  Patient would like for [place] to call her when refill has been sent.

## 2023-02-20 NOTE — Telephone Encounter (Signed)
Last OV 10/21/22 patient with you, Patient TOC with Bethanie Dicker okay to fill mounjaro until appt. Last A1c 5.7 on mounjaro. Have pended medication. Appt for TOC 02/27/23.

## 2023-02-22 ENCOUNTER — Encounter: Payer: Self-pay | Admitting: Nurse Practitioner

## 2023-02-24 MED ORDER — TIRZEPATIDE 2.5 MG/0.5ML ~~LOC~~ SOAJ: 2.5000 mg | SUBCUTANEOUS | 0 refills | Status: DC

## 2023-02-27 ENCOUNTER — Encounter: Payer: Self-pay | Admitting: Nurse Practitioner

## 2023-02-27 ENCOUNTER — Ambulatory Visit (INDEPENDENT_AMBULATORY_CARE_PROVIDER_SITE_OTHER): Payer: BC Managed Care – PPO | Admitting: Nurse Practitioner

## 2023-02-27 VITALS — BP 118/72 | HR 85 | Temp 98.6°F | Ht 59.0 in | Wt 158.6 lb

## 2023-02-27 DIAGNOSIS — K219 Gastro-esophageal reflux disease without esophagitis: Secondary | ICD-10-CM

## 2023-02-27 DIAGNOSIS — I10 Essential (primary) hypertension: Secondary | ICD-10-CM | POA: Diagnosis not present

## 2023-02-27 DIAGNOSIS — E119 Type 2 diabetes mellitus without complications: Secondary | ICD-10-CM | POA: Diagnosis not present

## 2023-02-27 DIAGNOSIS — E785 Hyperlipidemia, unspecified: Secondary | ICD-10-CM | POA: Diagnosis not present

## 2023-02-27 MED ORDER — LOSARTAN POTASSIUM-HCTZ 50-12.5 MG PO TABS: 1.0000 | ORAL_TABLET | Freq: Every day | ORAL | 3 refills | Status: DC

## 2023-02-27 NOTE — Assessment & Plan Note (Signed)
Chronic. Stable on Mounjaro 2.5 mg weekly. Continue. Patient is wanting to increase to 5 mg weekly but is unable to find it in stock anywhere. She will continue the 2.5 mg weekly until then. Encouraged healthy diet and exercise.

## 2023-02-27 NOTE — Assessment & Plan Note (Signed)
Chronic. Stable on Protonix 40 mg daily. Continue. Encouraged to monitor diet for triggers such as avoiding spicy and acidic foods.

## 2023-02-27 NOTE — Assessment & Plan Note (Signed)
Chronic. Stable on Losartan-HCTZ 50-12.5 daily. Continue. Refills sent.

## 2023-02-27 NOTE — Progress Notes (Signed)
Bethanie Dicker, NP-C Phone: [phone]  Angela Simon is a 57 y.o. female who presents today for transfer of care. She has no complaints or new concerns today. She is doing well on all of her medications.   HYPERTENSION Disease Monitoring: Blood pressure range- Not checking Chest pain- No      Dyspnea- No Medications: Compliance- Losartan/HCTZ   Lightheadedness- No   Edema- No  Lab Results  Component Value Date   NA 140 10/21/2022   K 4.1 10/21/2022   CO2 30 10/21/2022   GLUCOSE 76 10/21/2022   BUN 12 10/21/2022   CREATININE 0.83 10/21/2022   CALCIUM 9.7 10/21/2022   GFRNONAA 83 12/06/2019    DIABETES Disease Monitoring: Blood Sugar ranges- 120s Polyuria/phagia/dipsia- No      Optho- Yes Medications: Compliance- Mounjaro Hypoglycemic symptoms- No  Lab Results  Component Value Date   HGBA1C 5.7 10/21/2022     HYPERLIPIDEMIA Disease Monitoring: See symptoms for Hypertension Medications: Compliance- Pravastatin Right upper quadrant pain- No  Muscle aches- No  Lab Results  Component Value Date   CHOL 177 10/21/2022   HDL 49.20 10/21/2022   LDLCALC 97 10/21/2022   LDLDIRECT 97.0 02/06/2022   TRIG 155.0 (H) 10/21/2022   CHOLHDL 4 10/21/2022    GERD:   Reflux symptoms: None with medication   Abd pain: No   Blood in stool: No  Dysphagia: No   EGD: Yes- 2021  Medication: Protonix   Social History   Tobacco Use  Smoking Status Never  Smokeless Tobacco Never    Current Outpatient Medications on File Prior to Visit  Medication Sig Dispense Refill   bifidobacterium infantis (ALIGN) capsule Take by mouth.     cetirizine (ZYRTEC) 10 MG tablet Take 10 mg by mouth daily.     FLUoxetine (PROZAC) 20 MG capsule Take 1 capsule (20 mg total) by mouth daily. D/c tablet 90 capsule 3   levocetirizine (XYZAL ALLERGY 24HR) 5 MG tablet Take 1 tablet (5 mg total) by mouth at bedtime as needed for allergies. 90 tablet 3   Multiple Vitamin (MULTIVITAMIN) tablet Take 1  tablet by mouth daily.     Omega-3 1000 MG CAPS Take by mouth.     ondansetron (ZOFRAN) 4 MG tablet Take 1 tablet (4 mg total) by mouth every 8 (eight) hours as needed for nausea or vomiting. 40 tablet 2   pantoprazole (PROTONIX) 40 MG tablet Take 1 tablet (40 mg total) by mouth daily. 30 minutes before food 90 tablet 3   pravastatin (PRAVACHOL) 20 MG tablet Take 1 tablet (20 mg total) by mouth daily. After 6 pm 90 tablet 3   tirzepatide (MOUNJARO) 2.5 MG/0.5ML Pen Inject 2.5 mg into the skin once a week. 2 mL 0   tirzepatide (MOUNJARO) 5 MG/0.5ML Pen Inject 5 mg into the skin once a week. 2 mL 0   estradiol (VIVELLE-DOT) 0.0375 MG/24HR Place onto the skin.     No current facility-administered medications on file prior to visit.     ROS see history of present illness  Objective  Physical Exam Vitals:   02/27/23 1440  BP: 118/72  Pulse: 85  Temp: 98.6 F (37 C)  SpO2: 98%    BP Readings from Last 3 Encounters:  02/27/23 118/72  10/21/22 118/78  10/03/22 102/70   Wt Readings from Last 3 Encounters:  02/27/23 158 lb 9.6 oz (71.9 kg)  10/21/22 157 lb 3.2 oz (71.3 kg)  10/03/22 160 lb (72.6 kg)    Physical Exam  Constitutional:      General: She is not in acute distress.    Appearance: Normal appearance.  HENT:     Head: Normocephalic.  Cardiovascular:     Rate and Rhythm: Normal rate and regular rhythm.     Heart sounds: Normal heart sounds.  Pulmonary:     Effort: Pulmonary effort is normal.     Breath sounds: Normal breath sounds.  Skin:    General: Skin is warm and dry.  Neurological:     General: No focal deficit present.     Mental Status: She is alert.  Psychiatric:        Mood and Affect: Mood normal.        Behavior: Behavior normal.    Assessment/Plan: Please see individual problem list.  Essential hypertension Assessment & Plan: Chronic. Stable on Losartan-HCTZ 50-12.5 daily. Continue. Refills sent.   Orders: -     Losartan Potassium-HCTZ; Take  1 tablet by mouth daily.  Dispense: 90 tablet; Refill: 3  Hyperlipidemia, unspecified hyperlipidemia type Assessment & Plan: Chronic. Stable on Pravastatin 20 mg daily. Continue.    Type 2 diabetes mellitus without complication, without long-term current use of insulin (HCC) Assessment & Plan: Chronic. Stable on Mounjaro 2.5 mg weekly. Continue. Patient is wanting to increase to 5 mg weekly but is unable to find it in stock anywhere. She will continue the 2.5 mg weekly until then. Encouraged healthy diet and exercise.    Gastroesophageal reflux disease, unspecified whether esophagitis present Assessment & Plan: Chronic. Stable on Protonix 40 mg daily. Continue. Encouraged to monitor diet for triggers such as avoiding spicy and acidic foods.     Return in about 6 months (around 08/30/2023) for Follow up.   Bethanie Dicker, NP-C [facility] [employer]

## 2023-02-27 NOTE — Assessment & Plan Note (Signed)
Chronic. Stable on Pravastatin 20 mg daily. Continue.

## 2023-03-24 ENCOUNTER — Encounter: Payer: Self-pay | Admitting: Nurse Practitioner

## 2023-03-24 DIAGNOSIS — R110 Nausea: Secondary | ICD-10-CM

## 2023-03-24 MED ORDER — ONDANSETRON HCL 4 MG PO TABS: 4.0000 mg | ORAL_TABLET | Freq: Three times a day (TID) | ORAL | 2 refills | Status: DC | PRN

## 2023-03-27 ENCOUNTER — Other Ambulatory Visit: Payer: Self-pay | Admitting: Family

## 2023-03-27 DIAGNOSIS — E785 Hyperlipidemia, unspecified: Secondary | ICD-10-CM

## 2023-03-27 DIAGNOSIS — E1159 Type 2 diabetes mellitus with other circulatory complications: Secondary | ICD-10-CM

## 2023-03-27 DIAGNOSIS — E781 Pure hyperglyceridemia: Secondary | ICD-10-CM

## 2023-03-27 DIAGNOSIS — Z6833 Body mass index (BMI) 33.0-33.9, adult: Secondary | ICD-10-CM

## 2023-03-27 DIAGNOSIS — E119 Type 2 diabetes mellitus without complications: Secondary | ICD-10-CM

## 2023-03-27 DIAGNOSIS — I10 Essential (primary) hypertension: Secondary | ICD-10-CM

## 2023-04-21 ENCOUNTER — Other Ambulatory Visit: Payer: Self-pay | Admitting: Nurse Practitioner

## 2023-04-21 ENCOUNTER — Encounter: Payer: Self-pay | Admitting: Nurse Practitioner

## 2023-04-21 DIAGNOSIS — E119 Type 2 diabetes mellitus without complications: Secondary | ICD-10-CM

## 2023-04-21 DIAGNOSIS — Z6833 Body mass index (BMI) 33.0-33.9, adult: Secondary | ICD-10-CM

## 2023-04-21 DIAGNOSIS — E781 Pure hyperglyceridemia: Secondary | ICD-10-CM

## 2023-04-21 DIAGNOSIS — E785 Hyperlipidemia, unspecified: Secondary | ICD-10-CM

## 2023-04-21 DIAGNOSIS — E1159 Type 2 diabetes mellitus with other circulatory complications: Secondary | ICD-10-CM

## 2023-04-21 DIAGNOSIS — I10 Essential (primary) hypertension: Secondary | ICD-10-CM

## 2023-04-21 DIAGNOSIS — I152 Hypertension secondary to endocrine disorders: Secondary | ICD-10-CM

## 2023-04-22 MED ORDER — MOUNJARO 5 MG/0.5ML ~~LOC~~ SOAJ
SUBCUTANEOUS | 6 refills | Status: DC
Start: 2023-04-22 — End: 2023-05-18

## 2023-04-22 NOTE — Addendum Note (Signed)
Addended by: Donavan Foil on: 04/22/2023 09:20 AM   Modules accepted: Orders

## 2023-05-18 ENCOUNTER — Encounter: Payer: Self-pay | Admitting: Nurse Practitioner

## 2023-05-18 MED ORDER — TIRZEPATIDE 2.5 MG/0.5ML ~~LOC~~ SOAJ: 2.5000 mg | SUBCUTANEOUS | 1 refills | Status: DC

## 2023-06-11 ENCOUNTER — Telehealth (INDEPENDENT_AMBULATORY_CARE_PROVIDER_SITE_OTHER): Payer: BC Managed Care – PPO | Admitting: Family Medicine

## 2023-06-11 ENCOUNTER — Encounter: Payer: Self-pay | Admitting: Nurse Practitioner

## 2023-06-11 ENCOUNTER — Encounter: Payer: Self-pay | Admitting: Family Medicine

## 2023-06-11 VITALS — BP 104/71 | Temp 98.3°F | Ht 59.0 in | Wt 154.0 lb

## 2023-06-11 DIAGNOSIS — U071 COVID-19: Secondary | ICD-10-CM | POA: Diagnosis not present

## 2023-06-11 MED ORDER — NIRMATRELVIR/RITONAVIR (PAXLOVID)TABLET: 3.0000 | ORAL_TABLET | Freq: Two times a day (BID) | ORAL | 0 refills | Status: AC

## 2023-06-11 MED ORDER — BENZONATATE 100 MG PO CAPS: ORAL_CAPSULE | ORAL | 0 refills | Status: DC

## 2023-06-11 NOTE — Patient Instructions (Addendum)
HOME CARE TIPS:   -I sent the medication(s) we discussed to your pharmacy: Meds ordered this encounter  Medications   nirmatrelvir/ritonavir (PAXLOVID) 20 x 150 MG & 10 x 100MG  TABS    Sig: Take 3 tablets by mouth 2 (two) times daily for 5 days. (Take nirmatrelvir 150 mg two tablets twice daily for 5 days and ritonavir 100 mg one tablet twice daily for 5 days) Patient GFR is 80    Dispense:  30 tablet    Refill:  0   benzonatate (TESSALON PERLES) 100 MG capsule    Sig: 1-2 capsules up to twice daily as needed for cough.    Dispense:  30 capsule    Refill:  0     -I sent in the Covid19 treatment or referral you requested per our discussion. Please see the information provided below and discuss further with the pharmacist/treatment team.  -If taking Paxlovid, please review all medications, supplement and over the counter drugs with your pharmacist and ask them to check for any interactions. Please make the following changes to your regular medications while taking Paxlovid: *STOP your pravastatin while taking Paxlovid and restart 3 days after finishing Paxlovid.   -there is a chance of rebound illness with covid after improving. This can happen whether or not you take an antiviral treatment. If you become sick again with covid after getting better, please schedule a follow up virtual visit and isolate again.  -can use tylenol or aleve if needed for fevers, aches and pains per instructions  -nasal saline sinus rinses twice daily  -stay hydrated, drink plenty of fluids and eat small healthy meals - avoid dairy  -follow up with your doctor in 2-3 days unless improving and feeling better  -stay home while sick, except to seek medical care. If you have COVID19, you will likely be contagious for 7-10 days. Flu or Influenza is likely contagious for about 7 days. Other respiratory viral infections remain contagious for 5-10+ days depending on the virus and many other factors. Wear a good mask  that fits snugly (such as N95 or KN95) if around others to reduce the risk of transmission.  It was nice to meet you today, and I really hope you are feeling better soon. I help [facility] out with telemedicine visits on Tuesdays and Thursdays and am happy to help if you need a follow up virtual visit on those days. Otherwise, if you have any concerns or questions following this visit please schedule a follow up visit with your Primary Care doctor or seek care at a local urgent care clinic to avoid delays in care.    Seek in person care or schedule a follow up video visit promptly if your symptoms worsen, new concerns arise or you are not improving with treatment. Call 911 and/or seek emergency care if your symptoms are severe or life threatening.  Nirmatrelvir; Ritonavir Tablets What is this medication? NIRMATRELVIR; RITONAVIR (NIR ma TREL vir; ri TOE na veer) treats mild to moderate COVID-19. It may help people who are at high risk of developing severe illness. It works by limiting the spread of the virus in your body. This medicine may be used for other purposes; ask your health care provider or pharmacist if you have questions. COMMON BRAND NAME(S): PAXLOVID What should I tell my care team before I take this medication? They need to know if you have any of these conditions: Any allergies Any serious illness Kidney disease Liver disease An unusual or allergic reaction  to nirmatrelvir, ritonavir, other medications, foods, dyes, or preservatives Pregnant or trying to get pregnant Breast-feeding How should I use this medication? This product contains 2 different medications that are packaged together. For the standard dose, take 2 pink tablets of nirmatrelvir with 1 white tablet of ritonavir (3 tablets total) by mouth with water twice daily. Talk to your care team if you have kidney disease. You may need a different dose. Swallow the tablets whole. You can take it with or without food. If it  upsets your stomach, take it with food. Take all of this medication unless your care team tells you to stop it early. Keep taking it even if you think you are better. Talk to your care team about the use of this medication in children. While it may be prescribed for children as young as 12 years for selected conditions, precautions do apply. Overdosage: If you think you have taken too much of this medicine contact a poison control center or emergency room at once. NOTE: This medicine is only for you. Do not share this medicine with others. What if I miss a dose? If you miss a dose, take it as soon as you can unless it is more than 8 hours late. If it is more than 8 hours late, skip the missed dose. Take the next dose at the normal time. Do not take extra or 2 doses at the same time to make up for the missed dose. What may interact with this medication? Do not take this medication with any of the following: Alfuzosin Certain medications for anxiety or sleep, such as midazolam or triazolam Certain medications for cancer, such as apalutamide Certain medications for cholesterol, such as lovastatin or simvastatin Certain medications for irregular heartbeat, such as amiodarone, dronedarone, flecainide, propafenone, quinidine Certain medications for mental health conditions, such as lurasidone or pimozide Certain medications for seizures, such as carbamazepine, phenobarbital, phenytoin, primidone Colchicine Eletriptan Eplerenone Ergot alkaloids, such as dihydroergotamine, ergotamine, methylergonovine Finerenone Flibanserin Ivabradine Lomitapide Lumacaftor; ivacaftor Naloxegol Ranolazine Red Yeast Rice Rifampin Rifapentine Sildenafil Silodosin [facility] John's wort Tolvaptan Ubrogepant Voclosporin This medication may affect how other medications work, and other medications may affect the way this medication works. Talk with your care team about all of the medications you take. They may suggest  changes to your treatment plan to lower the risk of side effects and to make sure your medications work as intended. This list may not describe all possible interactions. Give your health care provider a list of all the medicines, herbs, non-prescription drugs, or dietary supplements you use. Also tell them if you smoke, drink alcohol, or use illegal drugs. Some items may interact with your medicine. What should I watch for while using this medication? Your condition will be monitored carefully while you are receiving this medication. Visit your care team for regular checkups. Tell your care team if your symptoms do not start to get better or if they get worse. If you have untreated HIV infection, this medication may lead to some HIV medications not working as well in the future. Estrogen and progestin hormones may not work as well while you are taking this medication. Your care team can help you find the contraceptive option that works for you. What side effects may I notice from receiving this medication? Side effects that you should report to your care team as soon as possible: Allergic reactions--skin rash, itching, hives, swelling of the face, lips, tongue, or throat Liver injury--right upper belly pain, loss of  appetite, nausea, light-colored stool, dark yellow or brown urine, yellowing skin or eyes, unusual weakness or fatigue Redness, blistering, peeling, or loosening of the skin, including inside the mouth Side effects that usually do not require medical attention (report these to your care team if they continue or are bothersome): Change in taste Diarrhea General discomfort and fatigue Increase in blood pressure Muscle pain Nausea Stomach pain This list may not describe all possible side effects. Call your doctor for medical advice about side effects. You may report side effects to FDA at 1-800-FDA-1088. Where should I keep my medication? Keep out of the reach of children and  pets. Store at room temperature between 20 and 25 degrees C (68 and 77 degrees F). Get rid of any unused medication after the expiration date. To get rid of medications that are no longer needed or have expired: Take the medication to a medication take-back program. Check with your pharmacy or law enforcement to find a location. If you cannot return the medication, check the label or package insert to see if the medication should be thrown out in the garbage or flushed down the toilet. If you are not sure, ask your care team. If it is safe to put it in the trash, take the medication out of the container. Mix the medication with cat litter, dirt, coffee grounds, or other unwanted substance. Seal the mixture in a bag or container. Put it in the trash. NOTE: This sheet is a summary. It may not cover all possible information. If you have questions about this medicine, talk to your doctor, pharmacist, or health care provider.  2024 Elsevier/Gold Standard (2022-11-24 00:00:00)

## 2023-06-11 NOTE — Progress Notes (Signed)
Virtual Visit via Video Note  I connected with Angela Simon  on 06/11/23 at 10:00 AM EDT by a video enabled telemedicine application and verified that I am speaking with the correct person using two identifiers.  Location patient: [place] Location provider:work or home office Persons participating in the virtual visit: patient, provider  I discussed the limitations and requested verbal permission for telemedicine visit. The patient expressed understanding and agreed to proceed.   HPI:  Acute telemedicine visit for Covid19: -Onset: last night, tested positive -husband sick as well and he is on Paxlovid -Symptoms include: low grade temp 99, body aches, headaches, nasal congestion, mild cough -Denies:CP, SOB, NVD -able to drink fluids -Has tried: ibuprofen -Pertinent past medical history: see below, GFR was 80 earlier this year, did have covid once before -Pertinent medication allergies: Allergies  Allergen Reactions   Metformin Other (See Comments)    Diarrhea Diarrhea Diarrhea    Metformin And Related     Diarrhea    Metoclopramide Other (See Comments)    Did not work fatigue, weight gain, increased anxiety   Ozempic (0.25 Or 0.5 Mg-Dose) [Semaglutide(0.25 Or 0.5mg -Dos)]     Gastroparesis like sx's   -COVID-19 vaccine status: has not had the covid shots Immunization History  Administered Date(s) Administered   Hep A / Hep B 11/04/2018, 12/08/2018, 05/10/2019   Influenza Inj Mdck Quad Pf 07/28/2017   Influenza,inj,Quad PF,6+ Mos 08/09/2018, 07/22/2019, 08/07/2021   Influenza-Unspecified 08/26/2013, 08/08/2014, 05/20/2020, 07/20/2022   Tdap 02/27/2021   Zoster Recombinant(Shingrix) 08/30/2021, 01/09/2022     ROS: See pertinent positives and negatives per HPI.  Past Medical History:  Diagnosis Date   Allergy    Carpal tunnel syndrome, bilateral    COVID-19    11/01/20   Episcleritis of right eye    08/22/20 lotemax 0.35 R eye tid x  1 week patty vision   Essential  hypertension    Fatty liver    Frozen shoulder syndrome    left 11/2020 Dr. [address] Filter GO   GERD (gastroesophageal reflux disease)    Gestational diabetes    Hyperlipidemia    IBS (irritable bowel syndrome)    Kidney stones    x 3 occurrences    Melanoma (HCC)    Obesity (BMI 30-39.9)    Renal disorder    Shingles     Past Surgical History:  Procedure Laterality Date   BREAST REDUCTION SURGERY     CARPAL TUNNEL RELEASE     09/17/21 right hand tbd left hand 09/2021 left Dr.Liu   CESAREAN SECTION     LITHOTRIPSY     x1   MELANOMA EXCISION     Right arm   NASAL SINUS SURGERY     TONSILLECTOMY       Current Outpatient Medications:    benzonatate (TESSALON PERLES) 100 MG capsule, 1-2 capsules up to twice daily as needed for cough., Disp: 30 capsule, Rfl: 0   bifidobacterium infantis (ALIGN) capsule, Take by mouth., Disp: , Rfl:    cetirizine (ZYRTEC) 10 MG tablet, Take 10 mg by mouth daily., Disp: , Rfl:    FLUoxetine (PROZAC) 20 MG capsule, Take 1 capsule (20 mg total) by mouth daily. D/c tablet, Disp: 90 capsule, Rfl: 3   levocetirizine (XYZAL ALLERGY 24HR) 5 MG tablet, Take 1 tablet (5 mg total) by mouth at bedtime as needed for allergies., Disp: 90 tablet, Rfl: 3   losartan-hydrochlorothiazide (HYZAAR) 50-12.5 MG tablet, Take 1 tablet by mouth daily., Disp: 90 tablet, Rfl: 3  Multiple Vitamin (MULTIVITAMIN) tablet, Take 1 tablet by mouth daily., Disp: , Rfl:    nirmatrelvir/ritonavir (PAXLOVID) 20 x 150 MG & 10 x 100MG  TABS, Take 3 tablets by mouth 2 (two) times daily for 5 days. (Take nirmatrelvir 150 mg two tablets twice daily for 5 days and ritonavir 100 mg one tablet twice daily for 5 days) Patient GFR is 80, Disp: 30 tablet, Rfl: 0   Omega-3 1000 MG CAPS, Take by mouth., Disp: , Rfl:    ondansetron (ZOFRAN) 4 MG tablet, Take 1 tablet (4 mg total) by mouth every 8 (eight) hours as needed for nausea or vomiting., Disp: 40 tablet, Rfl: 2   pantoprazole (PROTONIX) 40 MG  tablet, Take 1 tablet (40 mg total) by mouth daily. 30 minutes before food, Disp: 90 tablet, Rfl: 3   pravastatin (PRAVACHOL) 20 MG tablet, Take 1 tablet (20 mg total) by mouth daily. After 6 pm, Disp: 90 tablet, Rfl: 3   tirzepatide (MOUNJARO) 2.5 MG/0.5ML Pen, Inject 2.5 mg into the skin once a week., Disp: 2 mL, Rfl: 1   estradiol (VIVELLE-DOT) 0.0375 MG/24HR, Place onto the skin., Disp: , Rfl:   EXAM:  VITALS per patient if applicable: Vitals:   06/11/23 0926  BP: 104/71  Temp: 98.3 F (36.8 C)     GENERAL: alert, oriented, appears well and in no acute distress  HEENT: atraumatic, conjunttiva clear, no obvious abnormalities on inspection of external nose and ears  NECK: normal movements of the head and neck  LUNGS: on inspection no signs of respiratory distress, breathing rate appears normal, no obvious gross SOB, gasping or wheezing  CV: no obvious cyanosis  MS: moves all visible extremities without noticeable abnormality  PSYCH/NEURO: pleasant and cooperative, no obvious depression or anxiety, speech and thought processing grossly intact  ASSESSMENT AND PLAN:  Discussed the following assessment and plan:  COVID-19   Discussed treatment options, side effect and risk of drug interactions, ideal treatment window, potential complications, isolation and precautions for COVID-19.  Discussed possibility of rebound with or without antivirals. Checked for/reviewed last GFR - listed in HPI if available. After lengthy discussion, the patient opted for treatment with Paxlovid due to being higher risk for complications of covid or severe disease and other factors. Discussed risks/interactions/side effects vs possible benefits and precautions. This information was shared with patient during the visit and also was provided in patient instructions. The patient did want a prescription for cough, Tessalon Rx sent.  Other symptomatic care measures summarized in patient  instructions.  Work/School slipped offered: declined Advised to seek prompt virtual visit or in person care if worsening, new symptoms arise, or if is not improving with treatment as expected per our conversation of expected course. Discussed options for follow up care. Did let this patient know that I do telemedicine on Tuesdays and Thursdays for [facility] and those are the days I am logged into the system. Advised to schedule follow up visit with PCP, [place] virtual visits or [facility] if any further questions or concerns to avoid delays in care.   I discussed the assessment and treatment plan with the patient. The patient was provided an opportunity to ask questions and all were answered. The patient agreed with the plan and demonstrated an understanding of the instructions.     Terressa Koyanagi, DO

## 2023-06-12 ENCOUNTER — Telehealth: Payer: BC Managed Care – PPO | Admitting: Nurse Practitioner

## 2023-07-09 ENCOUNTER — Other Ambulatory Visit: Payer: Self-pay | Admitting: Nurse Practitioner

## 2023-07-09 ENCOUNTER — Encounter: Payer: Self-pay | Admitting: Nurse Practitioner

## 2023-07-10 MED ORDER — TIRZEPATIDE 2.5 MG/0.5ML ~~LOC~~ SOAJ: 2.5000 mg | SUBCUTANEOUS | 1 refills | Status: DC

## 2023-07-21 DIAGNOSIS — Z419 Encounter for procedure for purposes other than remedying health state, unspecified: Secondary | ICD-10-CM | POA: Diagnosis not present

## 2023-08-12 ENCOUNTER — Other Ambulatory Visit (HOSPITAL_COMMUNITY): Payer: Self-pay

## 2023-08-19 ENCOUNTER — Encounter: Payer: Self-pay | Admitting: Nurse Practitioner

## 2023-08-19 NOTE — Telephone Encounter (Signed)
PA for Greggory Keen is needed due to pt having a new insurance.

## 2023-08-20 ENCOUNTER — Other Ambulatory Visit (HOSPITAL_COMMUNITY): Payer: Self-pay

## 2023-08-20 ENCOUNTER — Telehealth: Payer: Self-pay

## 2023-08-20 NOTE — Telephone Encounter (Signed)
Pharmacy Patient Advocate Encounter   Received notification from Patient Advice Request messages that prior authorization for Mounjaro 2.5MG /0.5ML auto-injectors is required/requested.   Insurance verification completed.   The patient is insured through [facility] [place] .   Per test claim: PA required; PA submitted to above mentioned insurance via CoverMyMeds Key/confirmation #/EOC [id number] Status is pending

## 2023-08-21 ENCOUNTER — Other Ambulatory Visit: Payer: Self-pay | Admitting: Nurse Practitioner

## 2023-08-21 DIAGNOSIS — Z419 Encounter for procedure for purposes other than remedying health state, unspecified: Secondary | ICD-10-CM | POA: Diagnosis not present

## 2023-08-21 DIAGNOSIS — E119 Type 2 diabetes mellitus without complications: Secondary | ICD-10-CM

## 2023-08-21 NOTE — Telephone Encounter (Signed)
noted 

## 2023-08-21 NOTE — Telephone Encounter (Signed)
Pharmacy Patient Advocate Encounter  Received notification from [facility] Medicaid that Prior Authorization for [facility] 2.5MG /0.5ML auto-injectors has been DENIED.  Full denial letter will be uploaded to the media tab. See denial reason below.   PA #/Case ID/Reference #: [id number]   DENIAL REASON:  At least 2 preferred drugs must be tried before requesting this drug or tell us why the member cannot try any preferred alternatives. Please send [place] supporting chart notes and lab results. Here is list of preferred alternatives: Bydureon Pen, Byetta Pen, Trulicity Pen, Victoza Pen,  Ozempic Injection

## 2023-08-24 ENCOUNTER — Other Ambulatory Visit: Payer: Self-pay | Admitting: Nurse Practitioner

## 2023-08-24 DIAGNOSIS — E119 Type 2 diabetes mellitus without complications: Secondary | ICD-10-CM

## 2023-08-24 MED ORDER — TRULICITY 0.75 MG/0.5ML ~~LOC~~ SOAJ
0.7500 mg | SUBCUTANEOUS | 0 refills | Status: DC
Start: 2023-08-24 — End: 2023-09-10

## 2023-08-27 ENCOUNTER — Other Ambulatory Visit (HOSPITAL_COMMUNITY): Payer: Self-pay

## 2023-08-27 ENCOUNTER — Encounter: Payer: Self-pay | Admitting: Nurse Practitioner

## 2023-09-04 ENCOUNTER — Ambulatory Visit: Payer: BC Managed Care – PPO | Admitting: Nurse Practitioner

## 2023-09-04 ENCOUNTER — Telehealth: Payer: Self-pay | Admitting: Nurse Practitioner

## 2023-09-04 NOTE — Telephone Encounter (Signed)
Pt spouse called stating [employer] is sending an appeal form for the provider to fill out. Pt spouse stated the pt is out of her mounjaro and need the form to be done ASAP. I let the spouse know that we did receive the form and I will put it in the provider folder

## 2023-09-07 NOTE — Telephone Encounter (Signed)
error 

## 2023-09-08 NOTE — Telephone Encounter (Signed)
Husband called about the status of paper work, for denial. Husband was advised that paper work is waiting to be signed by provider and faxed. He was advised that the office will call him when the office hears from [employer], and it could take some time to hear from them.

## 2023-09-08 NOTE — Telephone Encounter (Signed)
Form has been faxed to the given fax number [phone] w/ a completed transmission log

## 2023-09-09 ENCOUNTER — Telehealth: Payer: Self-pay

## 2023-09-09 NOTE — Telephone Encounter (Signed)
Theador Hawthorne is calling from [employer] to state patient's appeal was submitted for [facility].  Theador Hawthorne states she is sending a Advertising account planner form via fax, which must be completed by patient and provider.  Theador Hawthorne states patient's husband asked Theador Hawthorne if we can load form to patient's MyChart so she can e-sign it.  Theador Hawthorne states we will need to fax the form to [phone], once it has been completed by both parties.

## 2023-09-10 ENCOUNTER — Encounter: Payer: Self-pay | Admitting: Nurse Practitioner

## 2023-09-10 ENCOUNTER — Telehealth: Payer: Medicaid Other | Admitting: Nurse Practitioner

## 2023-09-10 ENCOUNTER — Ambulatory Visit: Payer: Medicaid Other | Admitting: Nurse Practitioner

## 2023-09-10 VITALS — BP 120/80 | HR 86 | Temp 98.6°F | Ht 59.0 in | Wt 160.2 lb

## 2023-09-10 DIAGNOSIS — I152 Hypertension secondary to endocrine disorders: Secondary | ICD-10-CM | POA: Diagnosis not present

## 2023-09-10 DIAGNOSIS — R5383 Other fatigue: Secondary | ICD-10-CM | POA: Diagnosis not present

## 2023-09-10 DIAGNOSIS — Z7985 Long-term (current) use of injectable non-insulin antidiabetic drugs: Secondary | ICD-10-CM | POA: Diagnosis not present

## 2023-09-10 DIAGNOSIS — F32A Depression, unspecified: Secondary | ICD-10-CM | POA: Diagnosis not present

## 2023-09-10 DIAGNOSIS — F419 Anxiety disorder, unspecified: Secondary | ICD-10-CM | POA: Diagnosis not present

## 2023-09-10 DIAGNOSIS — E1159 Type 2 diabetes mellitus with other circulatory complications: Secondary | ICD-10-CM | POA: Diagnosis not present

## 2023-09-10 DIAGNOSIS — E785 Hyperlipidemia, unspecified: Secondary | ICD-10-CM | POA: Diagnosis not present

## 2023-09-10 DIAGNOSIS — E119 Type 2 diabetes mellitus without complications: Secondary | ICD-10-CM

## 2023-09-10 LAB — CBC WITH DIFFERENTIAL/PLATELET
Basophils Absolute: 0 10*3/uL (ref 0.0–0.1)
Basophils Relative: 0.6 % (ref 0.0–3.0)
Eosinophils Absolute: 0.1 10*3/uL (ref 0.0–0.7)
Eosinophils Relative: 1.5 % (ref 0.0–5.0)
HCT: 39 % (ref 36.0–46.0)
Hemoglobin: 12.9 g/dL (ref 12.0–15.0)
Lymphocytes Relative: 29.5 % (ref 12.0–46.0)
Lymphs Abs: 1.3 10*3/uL (ref 0.7–4.0)
MCHC: 33.1 g/dL (ref 30.0–36.0)
MCV: 92.3 fL (ref 78.0–100.0)
Monocytes Absolute: 0.2 10*3/uL (ref 0.1–1.0)
Monocytes Relative: 4.9 % (ref 3.0–12.0)
Neutro Abs: 2.7 10*3/uL (ref 1.4–7.7)
Neutrophils Relative %: 63.5 % (ref 43.0–77.0)
Platelets: 349 10*3/uL (ref 150.0–400.0)
RBC: 4.22 Mil/uL (ref 3.87–5.11)
RDW: 13.5 % (ref 11.5–15.5)
WBC: 4.3 10*3/uL (ref 4.0–10.5)

## 2023-09-10 LAB — COMPREHENSIVE METABOLIC PANEL
ALT: 21 U/L (ref 0–35)
AST: 19 U/L (ref 0–37)
Albumin: 4.2 g/dL (ref 3.5–5.2)
Alkaline Phosphatase: 41 U/L (ref 39–117)
BUN: 15 mg/dL (ref 6–23)
CO2: 30 meq/L (ref 19–32)
Calcium: 9.6 mg/dL (ref 8.4–10.5)
Chloride: 104 meq/L (ref 96–112)
Creatinine, Ser: 0.82 mg/dL (ref 0.40–1.20)
GFR: 80.77 mL/min (ref >60.00)
Glucose, Bld: 128 mg/dL — ABNORMAL HIGH (ref 70–99)
Potassium: 4.1 meq/L (ref 3.5–5.1)
Sodium: 141 meq/L (ref 135–145)
Total Bilirubin: 0.3 mg/dL (ref 0.2–1.2)
Total Protein: 6.5 g/dL (ref 6.0–8.3)

## 2023-09-10 LAB — LIPID PANEL
Cholesterol: 171 mg/dL (ref 0–200)
HDL: 51.7 mg/dL (ref >39.00)
LDL Cholesterol: 83 mg/dL (ref 0–99)
NonHDL: 119.37
Total CHOL/HDL Ratio: 3
Triglycerides: 184 mg/dL — ABNORMAL HIGH (ref 0.0–149.0)
VLDL: 36.8 mg/dL (ref 0.0–40.0)

## 2023-09-10 LAB — VITAMIN D 25 HYDROXY (VIT D DEFICIENCY, FRACTURES): VITD: 34.71 ng/mL (ref 30.00–100.00)

## 2023-09-10 LAB — VITAMIN B12: Vitamin B-12: 467 pg/mL (ref 211–911)

## 2023-09-10 LAB — TSH: TSH: 2.61 u[IU]/mL (ref 0.35–5.50)

## 2023-09-10 LAB — HEMOGLOBIN A1C: Hgb A1c MFr Bld: 6 % (ref 4.6–6.5)

## 2023-09-10 NOTE — Assessment & Plan Note (Signed)
She has no reported issues with her hyperlipidemia. We will continue Pravastatin 20 mg daily. The 10-year ASCVD risk score (Arnett DK, et al., 2019) is: 4%.

## 2023-09-10 NOTE — Telephone Encounter (Signed)
[employer] called, Angela Simon [phone].She would like to know if patient has tried taking any other mediation besides mounjaro.

## 2023-09-10 NOTE — Progress Notes (Signed)
Bethanie Dicker, NP-C Phone: [phone]  Angela Simon is a 57 y.o. female who presents today for follow up.   Discussed the use of AI scribe software for clinical note transcription with the patient, who gave verbal consent to proceed.  History of Present Illness   The patient, with a history of diabetes and hypertension, presents with concerns about fatigue and weight gain. She reports a recent increase in fatigue, describing it as a lack of energy rather than sleepiness. The patient has been managing her diabetes with diet and exercise, but has recently discontinued her medication due to a lapse in insurance coverage. She is currently in the appeal process with her new insurance for [facility]. She has been monitoring her blood sugar levels at home, with a recent reading of 128 mg/dL. She has glipizide available but expresses reluctance to use it due to previous experiences of hypoglycemia in the afternoons.  The patient also reports thinning hair and has been taking Nutrafol, a supplement, to address this. However, she discontinued the supplement due to concerns about potential effects on blood sugar levels. She also reports chronic neck pain, which she believes may be contributing to her fatigue and ear discomfort. She has received steroid injections for the neck pain in the past, but reports only temporary relief.  The patient's weight has increased slightly, which she attributes to dietary factors and discontinuation of her diabetes medication. She expresses a desire to lose weight and is trying to resume her medication pending insurance approval. The patient also reports long-term use of Prozac for mood stabilization, which she believes is effective and does not wish to change. She has recently experienced the loss of her mother, which has been emotionally challenging but has not led to a perceived need for adjustment of her Prozac dosage.  The patient is up-to-date on her mammogram and eye  examinations. She has not been experiencing any symptoms suggestive of cardiovascular disease, such as chest pain or shortness of breath. She is currently on losartan hydrochlorothiazide for hypertension and pravastatin for cholesterol management.      Social History   Tobacco Use  Smoking Status Never  Smokeless Tobacco Never    Current Outpatient Medications on File Prior to Visit  Medication Sig Dispense Refill   bifidobacterium infantis (ALIGN) capsule Take by mouth.     cetirizine (ZYRTEC) 10 MG tablet Take 10 mg by mouth daily.     estradiol (VIVELLE-DOT) 0.0375 MG/24HR Place onto the skin.     FLUoxetine (PROZAC) 20 MG capsule Take 1 capsule (20 mg total) by mouth daily. D/c tablet 90 capsule 3   levocetirizine (XYZAL ALLERGY 24HR) 5 MG tablet Take 1 tablet (5 mg total) by mouth at bedtime as needed for allergies. 90 tablet 3   losartan-hydrochlorothiazide (HYZAAR) 50-12.5 MG tablet Take 1 tablet by mouth daily. 90 tablet 3   Multiple Vitamin (MULTIVITAMIN) tablet Take 1 tablet by mouth daily.     Omega-3 1000 MG CAPS Take by mouth.     pantoprazole (PROTONIX) 40 MG tablet Take 1 tablet (40 mg total) by mouth daily. 30 minutes before food 90 tablet 3   pravastatin (PRAVACHOL) 20 MG tablet Take 1 tablet (20 mg total) by mouth daily. After 6 pm 90 tablet 3   No current facility-administered medications on file prior to visit.    ROS see history of present illness  Objective  Physical Exam Vitals:   09/10/23 0812  BP: 120/80  Pulse: 86  Temp: 98.6 F (37  C)  SpO2: 99%    BP Readings from Last 3 Encounters:  09/10/23 120/80  06/11/23 104/71  02/27/23 118/72   Wt Readings from Last 3 Encounters:  09/10/23 160 lb 3.2 oz (72.7 kg)  06/11/23 154 lb (69.9 kg)  02/27/23 158 lb 9.6 oz (71.9 kg)    Physical Exam Constitutional:      General: She is not in acute distress.    Appearance: Normal appearance.  HENT:     Head: Normocephalic.  Cardiovascular:     Rate  and Rhythm: Normal rate and regular rhythm.     Heart sounds: Normal heart sounds.  Pulmonary:     Effort: Pulmonary effort is normal.     Breath sounds: Normal breath sounds.  Skin:    General: Skin is warm and dry.  Neurological:     General: No focal deficit present.     Mental Status: She is alert.  Psychiatric:        Mood and Affect: Mood normal.        Behavior: Behavior normal.     Assessment/Plan: Please see individual problem list.  Type 2 diabetes mellitus without complication, without long-term current use of insulin (HCC) Assessment & Plan: She has transitioned off injectable medications due to insurance difficulty and currently has a fasting blood glucose of 128. Last A1c- 5.7. She has expressed a dislike for Glipizide due to hypoglycemia. We will resume Mounjaro 2.5 mg weekly pending insurance approval and check HbA1c.  Orders: -     Hemoglobin A1c  Hypertension associated with diabetes (HCC) Assessment & Plan: Her hypertension is well controlled on Losartan/Hydrochlorothiazide 50-12.5 mg daily. We will continue Losartan/Hydrochlorothiazide.   Orders: -     CBC with Differential/Platelet -     Comprehensive metabolic panel  Anxiety and depression Assessment & Plan: She has been stable on Prozac for many years. We will continue Prozac 20 mg daily.   Hyperlipidemia, unspecified hyperlipidemia type Assessment & Plan: She has no reported issues with her hyperlipidemia. We will continue Pravastatin 20 mg daily. The 10-year ASCVD risk score (Arnett DK, et al., 2019) is: 4%.   Orders: -     Lipid panel  Fatigue, unspecified type -     TSH -     VITAMIN D 25 Hydroxy (Vit-D Deficiency, Fractures) -     Vitamin B12   Return in about 6 months (around 03/09/2024) for Follow up.   Bethanie Dicker, NP-C [facility] - [employer]

## 2023-09-10 NOTE — Assessment & Plan Note (Signed)
Her hypertension is well controlled on Losartan/Hydrochlorothiazide 50-12.5 mg daily. We will continue Losartan/Hydrochlorothiazide.

## 2023-09-10 NOTE — Telephone Encounter (Signed)
Forms were completed and signed by pt at her appt and they have been faxed with a completed transmission log to the [phone]

## 2023-09-10 NOTE — Assessment & Plan Note (Signed)
She has been stable on Prozac for many years. We will continue Prozac 20 mg daily.

## 2023-09-10 NOTE — Telephone Encounter (Signed)
Called and they will be sending the infomration bak to the viewer   Reference # : [id number]

## 2023-09-10 NOTE — Assessment & Plan Note (Signed)
She has transitioned off injectable medications due to insurance difficulty and currently has a fasting blood glucose of 128. Last A1c- 5.7. She has expressed a dislike for Glipizide due to hypoglycemia. We will resume Mounjaro 2.5 mg weekly pending insurance approval and check HbA1c.

## 2023-09-14 NOTE — Telephone Encounter (Signed)
Called and spoke with pharmacy and they stated it has been approved:    Case number: [id number]

## 2023-09-14 NOTE — Telephone Encounter (Signed)
[employer] called stating they are working on an appeal and want to see if the provider has tried any alternatives for the pt. The medication the pt want is no part of their formulary.

## 2023-09-15 ENCOUNTER — Other Ambulatory Visit (HOSPITAL_COMMUNITY): Payer: Self-pay

## 2023-09-21 ENCOUNTER — Encounter: Payer: Self-pay | Admitting: Nurse Practitioner

## 2023-09-21 DIAGNOSIS — F32A Depression, unspecified: Secondary | ICD-10-CM

## 2023-09-21 MED ORDER — FLUOXETINE HCL 20 MG PO CAPS: 20.0000 mg | ORAL_CAPSULE | Freq: Every day | ORAL | 3 refills | Status: DC

## 2023-09-22 ENCOUNTER — Telehealth: Payer: Self-pay

## 2023-09-22 ENCOUNTER — Other Ambulatory Visit (HOSPITAL_COMMUNITY): Payer: Self-pay

## 2023-09-22 NOTE — Telephone Encounter (Signed)
Pharmacy Patient Advocate Encounter  Received notification from [facility] Medicaid that Appeal for Angela Simon has been APPROVED from 08/20/23 to 09/21/24. Ran test claim, Copay is $4. This test claim was processed through [facility]- copay amounts may vary at other pharmacies due to pharmacy/plan contracts, or as the patient moves through the different stages of their insurance plan.   PA #/Case ID/Reference #: [id number]

## 2023-09-23 ENCOUNTER — Telehealth: Payer: Self-pay

## 2023-09-23 NOTE — Telephone Encounter (Signed)
Pt husband would like to be called concerning the pt [phone]- thomas

## 2023-09-23 NOTE — Telephone Encounter (Signed)
Noted, pt already made aware

## 2023-09-23 NOTE — Telephone Encounter (Signed)
PA needed for Trulicity

## 2023-09-24 ENCOUNTER — Encounter: Payer: Self-pay | Admitting: Nurse Practitioner

## 2023-09-24 ENCOUNTER — Other Ambulatory Visit: Payer: Self-pay | Admitting: Nurse Practitioner

## 2023-09-24 DIAGNOSIS — E119 Type 2 diabetes mellitus without complications: Secondary | ICD-10-CM

## 2023-09-24 MED ORDER — TRULICITY 0.75 MG/0.5ML ~~LOC~~ SOAJ: 0.7500 mg | SUBCUTANEOUS | 0 refills | Status: DC

## 2023-09-24 MED ORDER — TRULICITY 1.5 MG/0.5ML ~~LOC~~ SOAJ: 1.5000 mg | SUBCUTANEOUS | 0 refills | Status: DC

## 2023-09-30 ENCOUNTER — Telehealth: Payer: Self-pay

## 2023-09-30 ENCOUNTER — Other Ambulatory Visit (HOSPITAL_COMMUNITY): Payer: Self-pay

## 2023-09-30 NOTE — Telephone Encounter (Signed)
Pharmacy Patient Advocate Encounter   Received notification from Pt Calls Messages that prior authorization for Trulicity is required/requested.   Insurance verification completed.   The patient is insured through [facility] MEDICAID .   Per test claim: PA is required for Trulicity, but pt has changed to [facility] which was recently approved

## 2023-09-30 NOTE — Telephone Encounter (Signed)
Angela Simon has been approved in a separate encounter, is PA for Trulicity still required? Thanks

## 2023-10-06 ENCOUNTER — Encounter: Payer: Self-pay | Admitting: Nurse Practitioner

## 2023-10-07 ENCOUNTER — Ambulatory Visit: Payer: Medicaid Other | Admitting: Nurse Practitioner

## 2023-10-07 ENCOUNTER — Ambulatory Visit (INDEPENDENT_AMBULATORY_CARE_PROVIDER_SITE_OTHER): Payer: Medicaid Other | Admitting: Nurse Practitioner

## 2023-10-07 VITALS — BP 100/68 | HR 85 | Temp 98.5°F | Ht 59.0 in | Wt 160.4 lb

## 2023-10-07 DIAGNOSIS — J0110 Acute frontal sinusitis, unspecified: Secondary | ICD-10-CM

## 2023-10-07 DIAGNOSIS — K120 Recurrent oral aphthae: Secondary | ICD-10-CM | POA: Diagnosis not present

## 2023-10-07 MED ORDER — AMOXICILLIN-POT CLAVULANATE 875-125 MG PO TABS
1.0000 | ORAL_TABLET | Freq: Two times a day (BID) | ORAL | 0 refills | Status: DC
Start: 2023-10-07 — End: 2024-02-15

## 2023-10-07 MED ORDER — PSEUDOEPH-BROMPHEN-DM 30-2-10 MG/5ML PO SYRP: 5.0000 mL | ORAL_SOLUTION | Freq: Four times a day (QID) | ORAL | 0 refills | Status: DC | PRN

## 2023-10-07 NOTE — Assessment & Plan Note (Addendum)
They exhibit symptoms of congestion, coughing, headache, and oral ulcers for about 4-5 days, without fever, body aches, or shortness of breath, and have had possible exposure to RSV. Symptoms have worsened, indicating a possible transition from viral to bacterial infection and are consistent with sinusitis. We will start Augmentin for 10 days, use Bromfed DM for symptom relief with a higher dose at bedtime, and continue Flonase as tolerated. Advised adequate fluid intake. They should check in after 3-4 days to assess the response to treatment. Return precautions given to patient.

## 2023-10-07 NOTE — Progress Notes (Signed)
Bethanie Dicker, NP-C Phone: [phone]  Angela Simon is a 57 y.o. female who presents today for congestion.   Discussed the use of AI scribe software for clinical note transcription with the patient, who gave verbal consent to proceed.  History of Present Illness   The patient presented with a chief complaint of oral ulcers, which started approximately four days prior to the consultation. The ulcers were located under the tongue, on the side of the tongue, and at the back of the throat. The patient initially attributed these symptoms to stress and hormonal changes, as they had recently run out of their estrogen patch. However, the patient also reported the onset of sneezing, a burning sensation in the head, and coughing two days prior to the consultation. The patient noted the presence of colored mucus upon blowing their nose, suggesting sinus congestion.  The patient reported a worsening of symptoms over the past four to five days, with increased discomfort at night due to coughing and postnasal drip. The patient denied experiencing any shortness of breath, fever, body aches, fatigue, nausea, or vomiting. The patient also denied any changes in taste or smell.  The patient reported recent stressful events, including a family member's hospitalization and a grandchild's diagnosis of Respiratory Syncytial Virus (RSV). The patient also mentioned a sibling who had similar symptoms, including oral ulcers and fever.  The patient has a history of sinus infections and reported a sensation of pressure in the head, particularly above the eyes. The patient also reported a history of neck issues, which they believe may contribute to the sensation of pressure and irritation in one ear.  The patient also reported a persistent issue with a blood blister on a finger, which had been present for several months. The patient had attempted to drain it multiple times, but it continued to refill. The patient denied  any pain associated with the blister at the time of the consultation.  The patient's symptoms, particularly the oral ulcers and sinus congestion, had not improved with the use of over-the-counter medications such as Nyquil and Flonase. The patient had been taking a probiotic and had been gargling with salt water to manage the oral ulcers.      Social History   Tobacco Use  Smoking Status Never  Smokeless Tobacco Never    Current Outpatient Medications on File Prior to Visit  Medication Sig Dispense Refill   bifidobacterium infantis (ALIGN) capsule Take by mouth.     cetirizine (ZYRTEC) 10 MG tablet Take 10 mg by mouth daily.     FLUoxetine (PROZAC) 20 MG capsule Take 1 capsule (20 mg total) by mouth daily. D/c tablet 90 capsule 3   levocetirizine (XYZAL ALLERGY 24HR) 5 MG tablet Take 1 tablet (5 mg total) by mouth at bedtime as needed for allergies. 90 tablet 3   losartan-hydrochlorothiazide (HYZAAR) 50-12.5 MG tablet Take 1 tablet by mouth daily. 90 tablet 3   Multiple Vitamin (MULTIVITAMIN) tablet Take 1 tablet by mouth daily.     Omega-3 1000 MG CAPS Take by mouth.     pantoprazole (PROTONIX) 40 MG tablet Take 1 tablet (40 mg total) by mouth daily. 30 minutes before food 90 tablet 3   pravastatin (PRAVACHOL) 20 MG tablet Take 1 tablet (20 mg total) by mouth daily. After 6 pm 90 tablet 3   estradiol (VIVELLE-DOT) 0.0375 MG/24HR Place onto the skin.     No current facility-administered medications on file prior to visit.     ROS see history of  present illness  Objective  Physical Exam Vitals:   10/07/23 1600  BP: 100/68  Pulse: 85  Temp: 98.5 F (36.9 C)  SpO2: 96%    BP Readings from Last 3 Encounters:  10/07/23 100/68  09/10/23 120/80  06/11/23 104/71   Wt Readings from Last 3 Encounters:  10/07/23 160 lb 6.4 oz (72.8 kg)  09/10/23 160 lb 3.2 oz (72.7 kg)  06/11/23 154 lb (69.9 kg)    Physical Exam Constitutional:      General: She is not in acute  distress.    Appearance: Normal appearance.  HENT:     Head: Normocephalic.     Right Ear: Tympanic membrane normal.     Left Ear: A middle ear effusion is present.     Nose: Congestion present.     Right Sinus: Frontal sinus tenderness present.     Left Sinus: Frontal sinus tenderness present.     Mouth/Throat:     Mouth: Mucous membranes are moist.     Pharynx: Oropharynx is clear.  Eyes:     Conjunctiva/sclera: Conjunctivae normal.     Pupils: Pupils are equal, round, and reactive to light.  Cardiovascular:     Rate and Rhythm: Normal rate and regular rhythm.     Heart sounds: Normal heart sounds.  Pulmonary:     Effort: Pulmonary effort is normal.     Breath sounds: Normal breath sounds.  Lymphadenopathy:     Cervical: No cervical adenopathy.  Skin:    General: Skin is warm and dry.  Neurological:     General: No focal deficit present.     Mental Status: She is alert.  Psychiatric:        Mood and Affect: Mood normal.        Behavior: Behavior normal.    Assessment/Plan: Please see individual problem list.  Acute non-recurrent frontal sinusitis Assessment & Plan: They exhibit symptoms of congestion, coughing, headache, and oral ulcers for about 4-5 days, without fever, body aches, or shortness of breath, and have had possible exposure to RSV. Symptoms have worsened, indicating a possible transition from viral to bacterial infection and are consistent with sinusitis. We will start Augmentin for 10 days, use Bromfed DM for symptom relief with a higher dose at bedtime, and continue Flonase as tolerated. Advised adequate fluid intake. They should check in after 3-4 days to assess the response to treatment. Return precautions given to patient.   Orders: -     Amoxicillin-Pot Clavulanate; Take 1 tablet by mouth 2 (two) times daily.  Dispense: 20 tablet; Refill: 0 -     Pseudoeph-Bromphen-DM; Take 5-10 mLs by mouth every 6 (six) hours as needed.  Dispense: 120 mL; Refill:  0  Oral aphthous ulcer Assessment & Plan: They have multiple oral ulcers for approximately 4 days, showing significant improvement with salt water gargles. We will continue salt water gargles and monitor for changes or worsening.     Return if symptoms worsen or fail to improve.   Bethanie Dicker, NP-C [facility] - [employer]

## 2023-10-07 NOTE — Assessment & Plan Note (Signed)
They have multiple oral ulcers for approximately 4 days, showing significant improvement with salt water gargles. We will continue salt water gargles and monitor for changes or worsening.

## 2023-10-09 ENCOUNTER — Encounter: Payer: Self-pay | Admitting: Nurse Practitioner

## 2023-10-15 ENCOUNTER — Encounter: Payer: Self-pay | Admitting: Nurse Practitioner

## 2023-10-15 DIAGNOSIS — E119 Type 2 diabetes mellitus without complications: Secondary | ICD-10-CM

## 2023-10-15 MED ORDER — TIRZEPATIDE 5 MG/0.5ML ~~LOC~~ SOAJ: 5.0000 mg | SUBCUTANEOUS | 0 refills | Status: DC

## 2023-10-27 ENCOUNTER — Encounter: Payer: Self-pay | Admitting: Nurse Practitioner

## 2023-10-27 DIAGNOSIS — E785 Hyperlipidemia, unspecified: Secondary | ICD-10-CM

## 2023-10-27 DIAGNOSIS — I152 Hypertension secondary to endocrine disorders: Secondary | ICD-10-CM

## 2023-10-27 MED ORDER — PRAVASTATIN SODIUM 20 MG PO TABS: 20.0000 mg | ORAL_TABLET | Freq: Every day | ORAL | 3 refills | Status: DC

## 2023-11-02 ENCOUNTER — Encounter: Payer: Self-pay | Admitting: Nurse Practitioner

## 2023-11-02 DIAGNOSIS — K219 Gastro-esophageal reflux disease without esophagitis: Secondary | ICD-10-CM

## 2023-11-02 MED ORDER — PANTOPRAZOLE SODIUM 40 MG PO TBEC
40.0000 mg | DELAYED_RELEASE_TABLET | Freq: Every day | ORAL | 3 refills | Status: DC
Start: 2023-11-02 — End: 2024-05-23

## 2023-11-11 ENCOUNTER — Other Ambulatory Visit (HOSPITAL_COMMUNITY): Payer: Self-pay

## 2023-11-11 ENCOUNTER — Telehealth: Payer: Self-pay

## 2023-11-11 NOTE — Telephone Encounter (Addendum)
*  Primary  Pharmacy Patient Advocate Encounter   Received notification from Fax that prior authorization for Mounjaro 5MG /0.5ML auto-injectors  is required/requested.  CMM Key: [employer] verification completed.   The patient is insured through [facility] .   Per test claim: PA required; However, NEW/RECENT labs/notes are needed to complete & submit PA request. Please see below.   Has the patient tried and failed or experienced an insufficient response to at least two preferred products, or does the patient have a clinical reason that preferred products cannot be tried? The preferred drug products include Byetta pen, Trulicity pen, brand Victoza pen, Ozempic injection.*   Has the patient improved while on the requested drug with documentation provided? NOTE: Physical documentation must be provided.*  Are individual clinical goals set by the provider being met?*

## 2023-11-12 ENCOUNTER — Other Ambulatory Visit: Payer: Self-pay | Admitting: Nurse Practitioner

## 2023-11-12 DIAGNOSIS — E119 Type 2 diabetes mellitus without complications: Secondary | ICD-10-CM

## 2023-11-12 NOTE — Telephone Encounter (Signed)
Provider is welcome to write letter to insurance to see if they will approve medication again for this year- there is not an official denial because we have not completed the request due to the patient not having tried a second alternative and no reason given that the patient cannot try any other alternatives. Provider may have to contact insurance to discuss, or just try to do the same thing that they did last year.

## 2023-11-13 MED ORDER — TIRZEPATIDE 5 MG/0.5ML ~~LOC~~ SOAJ: 5.0000 mg | SUBCUTANEOUS | 0 refills | Status: DC

## 2023-11-23 ENCOUNTER — Telehealth: Payer: Self-pay

## 2023-11-23 NOTE — Telephone Encounter (Signed)
Copied from CRM [id number]. Topic: Clinical - Prescription Issue >> Nov 23, 2023 11:29 AM Drema Balzarine wrote: Reason for CRM: Patient spouse called regarding Tirzepatide Greggory Keen) 5 MG/0.5ML Pen - says pharmacy reached out to our office twice for prior authorization and patient needs med for this upcoming Wednesday 11/25/23. Maisie Fus says the authorization went through [facility] before and as of 10/21/23 they go through [employer]. Please advise and call patient with update

## 2023-11-25 ENCOUNTER — Other Ambulatory Visit (HOSPITAL_COMMUNITY): Payer: Self-pay

## 2023-11-25 NOTE — Telephone Encounter (Signed)
 Called and informed pt that a message has been sent to the pharmacy team about her new updated insurance and to try a new PA and that she can miss this week for her next dose and hopefully we will hear from the pharmacy team by then.

## 2023-11-25 NOTE — Telephone Encounter (Signed)
 Copied from CRM [id number]. Topic: Clinical - Prescription Issue >> Nov 25, 2023  9:11 AM Adaysia C wrote: Reason for CRM: Please respond to patients insurance about the prior authorization for tirzepatide  (MOUNJARO ) 5 MG/0.5ML Pen, the insurance submitted the prior authorization to Cover My Meds to start the process and the providers can access the information for the prior auth on there as well. The patient is completely out of the medication and is due for an injection today. Please follow up with the patient after the status for the prior authorizatuion has been update [phone].

## 2023-11-25 NOTE — Telephone Encounter (Signed)
 See phone note on 11-23-23 message was routed to Prior Auth team to start a new PA as pt messaged and stated insurance changed and that she uploaded insurance.

## 2023-11-26 NOTE — Telephone Encounter (Signed)
 Please see note from 11/12/2023 in regards to PA,  Provider is welcome to write letter to insurance to see if they will approve medication again for this year- there is not an official denial because we have not completed the request due to the patient not having tried a second alternative and no reason given that the patient cannot try any other alternatives. Provider may have to contact insurance to discuss, or just try to do the same thing that they did last year.  Clinical rationale needs to be provided as to why pt is unable to trial other preferred medication (Trulicity ). If rationale/letter of medical necessity is provided, we can submit PA, without letter, PA will be denied and appeal will need to be started. Thanks.

## 2023-12-01 ENCOUNTER — Other Ambulatory Visit (HOSPITAL_COMMUNITY): Payer: Self-pay

## 2023-12-01 ENCOUNTER — Telehealth: Payer: Self-pay

## 2023-12-01 NOTE — Telephone Encounter (Signed)
Copied from CRM [id number]. Topic: Clinical - Prescription Issue >> Dec 01, 2023  2:39 PM Orinda Kenner C wrote: Reason for CRM: Linus Galas from [facility] pharmacy benefits manager [phone] or [phone], is trying to complete the PA on Mounjaro 5MG /0.5ML auto-injectors. Please call back.

## 2023-12-03 ENCOUNTER — Other Ambulatory Visit: Payer: Self-pay

## 2023-12-03 ENCOUNTER — Other Ambulatory Visit (HOSPITAL_COMMUNITY): Payer: Self-pay

## 2023-12-03 NOTE — Telephone Encounter (Signed)
Denial was received via fax. Due to the same reasons needing pt to try at least two of the listed meds. [phone] phone number was provided to set up a peer to peer review to get pt approved.    I called and provided my direct phone number to get this peer to peer set up. They may call within 48 hours to set up.

## 2023-12-03 NOTE — Telephone Encounter (Signed)
Called the [phone] and initiated a new PA. Ref #[id number]   Informed em we would receive a decision via fax or mail.

## 2023-12-03 NOTE — Telephone Encounter (Signed)
Please see previous encounters, medication has been denied. Did call numbers provided and rep stated that she didn't see a currently active PA for [facility]

## 2023-12-04 ENCOUNTER — Telehealth: Payer: Self-pay

## 2023-12-04 ENCOUNTER — Other Ambulatory Visit: Payer: Self-pay | Admitting: Nurse Practitioner

## 2023-12-04 DIAGNOSIS — E119 Type 2 diabetes mellitus without complications: Secondary | ICD-10-CM

## 2023-12-04 MED ORDER — TRULICITY 0.75 MG/0.5ML ~~LOC~~ SOAJ: 0.7500 mg | SUBCUTANEOUS | 0 refills | Status: DC

## 2023-12-04 NOTE — Telephone Encounter (Signed)
Called wife to update.

## 2023-12-04 NOTE — Telephone Encounter (Signed)
PA needed for Trulicity

## 2023-12-04 NOTE — Telephone Encounter (Signed)
Copied from CRM [id number]. Topic: Clinical - Prescription Issue >> Dec 04, 2023  9:37 AM Fuller Mandril wrote: Reason for CRM: Patient husband calling back to check status of PA for patient medication. Per notes PA denies. Awaiting peer to peer review. Maybe up to 48 hours per notes. Advised caller. Please let them know when update has been received. Thank You

## 2023-12-04 NOTE — Telephone Encounter (Signed)
Peer to Peer called back and she stated insurances are getting really strict on the GLP meds and the pt absolutely has to try Trulicity or one of the other medications first.    Angela Simon stated that if she tried Trulicity for a few weeks, has adverse reactions and have it documented a new PA can be done and it will be approved but even with Peer to peer they will not approve this medication until 2 have been tried.   Called pt and informed her and she stated she is willing to try the Trulicity.    She would like a mychart sent with what you advise.

## 2023-12-07 ENCOUNTER — Telehealth: Payer: Self-pay

## 2023-12-07 NOTE — Telephone Encounter (Signed)
Called and pt has been approved for the trulicity

## 2023-12-07 NOTE — Telephone Encounter (Signed)
Copied from CRM [id number]. Topic: Clinical - Prescription Issue >> Dec 07, 2023 12:07 PM Corin V wrote: Reason for CRM: Jasmine with [facility] calling to obtain additional information for th prior auth for the Trulicity. Reference number: [id number]

## 2023-12-08 ENCOUNTER — Other Ambulatory Visit (HOSPITAL_COMMUNITY): Payer: Self-pay

## 2023-12-09 ENCOUNTER — Telehealth: Payer: Self-pay

## 2023-12-09 ENCOUNTER — Other Ambulatory Visit (HOSPITAL_COMMUNITY): Payer: Self-pay

## 2023-12-09 NOTE — Telephone Encounter (Signed)
Pharmacy Patient Advocate Encounter   Received notification from Pt Calls Messages that prior authorization for TRULICITY is required/requested.   Insurance verification completed.   The patient is insured through [facility] .   Per test claim: Refill too soon. PA is not needed at this time. Medication was filled 12/09/23. Next eligible fill date is 12/30/23.

## 2023-12-09 NOTE — Telephone Encounter (Signed)
Per test claim: Refill too soon. PA is not needed at this time. Medication was filled 12/09/23. Next eligible fill date is 12/30/23.

## 2023-12-10 ENCOUNTER — Encounter: Payer: Self-pay | Admitting: Nurse Practitioner

## 2023-12-17 ENCOUNTER — Telehealth: Payer: Self-pay

## 2023-12-17 ENCOUNTER — Encounter: Payer: Self-pay | Admitting: Nurse Practitioner

## 2023-12-17 ENCOUNTER — Other Ambulatory Visit (HOSPITAL_COMMUNITY): Payer: Self-pay

## 2023-12-17 MED ORDER — TIRZEPATIDE 5 MG/0.5ML ~~LOC~~ SOAJ: 5.0000 mg | SUBCUTANEOUS | 1 refills | Status: DC

## 2023-12-17 MED ORDER — TIRZEPATIDE 2.5 MG/0.5ML ~~LOC~~ SOAJ: 2.5000 mg | SUBCUTANEOUS | 0 refills | Status: DC

## 2023-12-17 NOTE — Telephone Encounter (Signed)
 Pharmacy Patient Advocate Encounter   Received notification from Pt Calls Messages that prior authorization for Mounjaro 2.5MG /0.5ML auto-injectors is required/requested.   Insurance verification completed.   The patient is insured through [facility] .   Per test claim: PA required; PA submitted to above mentioned insurance via CoverMyMeds Key/confirmation #/EOC [id number] Status is pending

## 2023-12-17 NOTE — Telephone Encounter (Signed)
 PA request has been Submitted. New Encounter created for follow up. For additional info see Pharmacy Prior Auth telephone encounter from 12/17/23.

## 2023-12-17 NOTE — Telephone Encounter (Signed)
 PA needed for mounjaro   Pt has now tried [employer] and Trulicity and is not tolerating Trulicity that will be 2 of the preferred medications on insurance

## 2023-12-17 NOTE — Telephone Encounter (Signed)
 Pharmacy Patient Advocate Encounter  Received notification from [facility] that Prior Authorization for [facility] 2.5MG /0.5ML auto-injectors  has been APPROVED from 12/17/23 to 12/16/24   PA #/Case ID/Reference #: [id number]

## 2023-12-22 ENCOUNTER — Other Ambulatory Visit (HOSPITAL_COMMUNITY): Payer: Self-pay

## 2024-01-06 DIAGNOSIS — M4802 Spinal stenosis, cervical region: Secondary | ICD-10-CM | POA: Diagnosis not present

## 2024-01-18 ENCOUNTER — Other Ambulatory Visit: Payer: Self-pay | Admitting: Nurse Practitioner

## 2024-01-26 DIAGNOSIS — M47812 Spondylosis without myelopathy or radiculopathy, cervical region: Secondary | ICD-10-CM | POA: Diagnosis not present

## 2024-01-26 DIAGNOSIS — M4802 Spinal stenosis, cervical region: Secondary | ICD-10-CM | POA: Diagnosis not present

## 2024-02-03 ENCOUNTER — Other Ambulatory Visit: Payer: Self-pay | Admitting: Nurse Practitioner

## 2024-02-03 ENCOUNTER — Encounter: Payer: Self-pay | Admitting: Nurse Practitioner

## 2024-02-03 DIAGNOSIS — Z1283 Encounter for screening for malignant neoplasm of skin: Secondary | ICD-10-CM

## 2024-02-04 DIAGNOSIS — D2272 Melanocytic nevi of left lower limb, including hip: Secondary | ICD-10-CM | POA: Diagnosis not present

## 2024-02-04 DIAGNOSIS — D225 Melanocytic nevi of trunk: Secondary | ICD-10-CM | POA: Diagnosis not present

## 2024-02-04 DIAGNOSIS — D2262 Melanocytic nevi of left upper limb, including shoulder: Secondary | ICD-10-CM | POA: Diagnosis not present

## 2024-02-04 DIAGNOSIS — Z85820 Personal history of malignant melanoma of skin: Secondary | ICD-10-CM | POA: Diagnosis not present

## 2024-02-04 DIAGNOSIS — L821 Other seborrheic keratosis: Secondary | ICD-10-CM | POA: Diagnosis not present

## 2024-02-04 DIAGNOSIS — D2261 Melanocytic nevi of right upper limb, including shoulder: Secondary | ICD-10-CM | POA: Diagnosis not present

## 2024-02-12 ENCOUNTER — Telehealth: Payer: Self-pay | Admitting: Pharmacy Technician

## 2024-02-12 NOTE — Telephone Encounter (Signed)
    Pt just filled 01/19/24. Request came in cmm

## 2024-02-15 ENCOUNTER — Ambulatory Visit: Admitting: Family Medicine

## 2024-02-15 ENCOUNTER — Encounter: Payer: Self-pay | Admitting: Family Medicine

## 2024-02-15 VITALS — BP 110/70 | HR 99 | Temp 98.7°F | Resp 20 | Ht 59.0 in | Wt 162.2 lb

## 2024-02-15 DIAGNOSIS — J029 Acute pharyngitis, unspecified: Secondary | ICD-10-CM

## 2024-02-15 DIAGNOSIS — J309 Allergic rhinitis, unspecified: Secondary | ICD-10-CM | POA: Diagnosis not present

## 2024-02-15 DIAGNOSIS — J040 Acute laryngitis: Secondary | ICD-10-CM | POA: Diagnosis not present

## 2024-02-15 DIAGNOSIS — B9789 Other viral agents as the cause of diseases classified elsewhere: Secondary | ICD-10-CM | POA: Diagnosis not present

## 2024-02-15 LAB — POC COVID19 BINAXNOW: SARS Coronavirus 2 Ag: NEGATIVE

## 2024-02-15 MED ORDER — AZELASTINE HCL 0.1 % NA SOLN: 2.0000 | Freq: Two times a day (BID) | NASAL | 1 refills | Status: DC

## 2024-02-15 MED ORDER — HYDROCOD POLI-CHLORPHE POLI ER 10-8 MG/5ML PO SUER
5.0000 mL | Freq: Two times a day (BID) | ORAL | 0 refills | Status: DC | PRN
Start: 2024-02-15 — End: 2024-03-10

## 2024-02-15 MED ORDER — FLUTICASONE PROPIONATE 50 MCG/ACT NA SUSP: 2.0000 | Freq: Two times a day (BID) | NASAL | 1 refills | Status: DC

## 2024-02-15 NOTE — Progress Notes (Signed)
 SUBJECTIVE:   Chief Complaint  Patient presents with   Hoarse    Started Thursday    HPI Presents for acute visit  Discussed the use of AI scribe software for clinical note transcription with the patient, who gave verbal consent to proceed.  History of Present Illness Angela Simon is a 58 year old female who presents with loss of voice and upper respiratory symptoms.  She has been experiencing a loss of voice for four days, accompanied by a burning sensation and sneezing, followed by a scratchy throat. The scratchiness is more pronounced in her throat rather than her chest, with some pressure felt. Her voice has been hoarse, making it an effort to speak, and she has been coughing, which is painful when done forcefully.  She has not experienced a significant fever, with the highest recorded temperature being around 1F. No coughing up of sputum and she has been trying to avoid coughing. Her nasal discharge has been slightly discolored but not significantly so. She has been using cough drops frequently to manage her symptoms, which has led to her tongue feeling numb.  She has a history of sinus issues, having undergone sinus surgery for a deviated septum and sinus cleaning in the past. She experiences frequent pressure in her ears and head, which she attributes to congestion. She has not been around anyone sick recently and denies any recent exposure to allergens or irritants, although she notes that pollen levels have been high.  Her current medications include Mounjaro  5 mg, Hyzaar, levocetirizine, and Prozac . She recently restarted Mounjaro  after a brief discontinuation. She also takes levocetirizine at night for allergies. She is not currently using Flonase  or any nasal sprays.  In her social history, she keeps her granddaughter, who is almost two years old, and denies any smoking or exposure to smoke. She has a [employer] dog but reports no allergies to pets. She has a history of  ear infections as a child.    PERTINENT PMH / PSH: As above  OBJECTIVE:  BP 110/70   Pulse 99   Temp 98.7 F (37.1 C)   Resp 20   Ht 4\' 11"  (1.499 m)   Wt 162 lb 4 oz (73.6 kg)   SpO2 99%   BMI 32.77 kg/m    Physical Exam Vitals reviewed.  Constitutional:      General: She is not in acute distress.    Appearance: Normal appearance. She is normal weight. She is not ill-appearing, toxic-appearing or diaphoretic.  HENT:     Right Ear: Tympanic membrane, ear canal and external ear normal.     Left Ear: Tympanic membrane, ear canal and external ear normal.     Nose: Rhinorrhea present.     Right Turbinates: Pale.     Left Turbinates: Pale.     Right Sinus: No frontal sinus tenderness.     Left Sinus: No frontal sinus tenderness.     Mouth/Throat:     Mouth: Mucous membranes are moist.     Pharynx: Oropharynx is clear.  Eyes:     General:        Right eye: No discharge.        Left eye: No discharge.     Conjunctiva/sclera: Conjunctivae normal.  Cardiovascular:     Rate and Rhythm: Normal rate and regular rhythm.     Heart sounds: Normal heart sounds.  Pulmonary:     Effort: Pulmonary effort is normal.     Breath sounds: Normal  breath sounds.  Abdominal:     General: Bowel sounds are normal.  Musculoskeletal:        General: Normal range of motion.  Skin:    General: Skin is warm and dry.  Neurological:     General: No focal deficit present.     Mental Status: She is alert and oriented to person, place, and time. Mental status is at baseline.  Psychiatric:        Mood and Affect: Mood normal.        Behavior: Behavior normal.        Thought Content: Thought content normal.        Judgment: Judgment normal.           02/15/2024    3:58 PM 02/27/2023    2:43 PM 10/21/2022   11:41 AM 10/03/2022   12:20 PM 02/06/2022   10:41 AM  Depression screen PHQ 2/9  Decreased Interest 0 0 0 0 0  Down, Depressed, Hopeless 0 0 0 0 0  PHQ - 2 Score 0 0 0 0 0  Altered  sleeping 0 0     Tired, decreased energy 0 0     Change in appetite 0 0     Feeling bad or failure about yourself  0 0     Trouble concentrating 0 0     Moving slowly or fidgety/restless 0 0     Suicidal thoughts 0 0     PHQ-9 Score 0 0     Difficult doing work/chores Not difficult at all Not difficult at all         02/15/2024    3:59 PM 02/27/2023    2:43 PM 04/24/2020    9:14 AM 11/28/2016   10:05 AM  GAD 7 : Generalized Anxiety Score  Nervous, Anxious, on Edge 0 0 3 3  Control/stop worrying 0 0 3 3  Worry too much - different things 0 0 3 3  Trouble relaxing 0 0 3 1  Restless 0 0 2 1  Easily annoyed or irritable 0 0 3 3  Afraid - awful might happen 0 0 3 3  Total GAD 7 Score 0 0 20 17  Anxiety Difficulty Not difficult at all Not difficult at all Very difficult Not difficult at all    ASSESSMENT/PLAN:  Viral laryngitis Assessment & Plan: Symptoms suggest viral etiology. No bacterial infection. Discussed low-dose steroids if no improvement, but current improvement suggests waiting. Explained steroid risks, including hyperglycemia due to diabetes. - Advise regular voice use, avoid whispering. - Consider low-dose steroid if no improvement by Friday. Monitor blood sugar if initiated.  Orders: -     Hydrocod Poli-Chlorphe Poli ER; Take 5 mLs by mouth every 12 (twelve) hours as needed for cough.  Dispense: 50 mL; Refill: 0  Sore throat -     POC COVID-19 BinaxNow -     Hydrocod Poli-Chlorphe Poli ER; Take 5 mLs by mouth every 12 (twelve) hours as needed for cough.  Dispense: 50 mL; Refill: 0  Allergic rhinitis, unspecified seasonality, unspecified trigger Assessment & Plan: Postnasal drip causing throat irritation and cough. Discussed nasal spray use to alleviate symptoms. - Start Flonase  nasal spray, two sprays each nostril twice daily. - Start Astelin  nasal spray, two sprays each nostril twice daily. - Continue levocetirizine at night. - Use humidifier for  laryngitis.   Orders: -     Fluticasone  Propionate; Place 2 sprays into both nostrils in the morning and at bedtime.  Dispense: 16 g; Refill: 1 -     Azelastine  HCl; Place 2 sprays into both nostrils 2 (two) times daily. Use in each nostril as directed  Dispense: 30 mL; Refill: 1          PDMP reviewed  Return if symptoms worsen or fail to improve, for PCP.  Valli Gaw, MD

## 2024-02-15 NOTE — Patient Instructions (Addendum)
 It was a pleasure meeting you today. Thank you for allowing me to take part in your health care.  Our goals for today as we discussed include:  Flonase  2 sprays 2 times a day Astelin 2 sprays 2 times a day Continue Xyzal  5 mg at night  If no improvement in 2-3 days notify MD  You can take Tylenol and/or Ibuprofen as needed for fever reduction and pain relief.   For cough: honey 1/2 to 1 teaspoon (you can dilute the honey in water or another fluid).  You can also use guaifenesin and dextromethorphan for cough. You can use a humidifier for chest congestion and cough.  If you don't have a humidifier, you can sit in the bathroom with the hot shower running.      For sore throat: try warm salt water gargles, cepacol lozenges, throat spray, warm tea or water with lemon/honey, popsicles or ice, or OTC cold relief medicine for throat discomfort.   For congestion: take a daily anti-histamine like Zyrtec, Claritin, and a oral decongestant, such as pseudoephedrine.  You can also use Flonase  1-2 sprays in each nostril daily.   It is important to stay hydrated: drink plenty of fluids (water, gatorade/powerade/pedialyte, juices, or teas) to keep your throat moisturized and help further relieve irritation/discomfort.    This is a list of the screening recommended for you and due dates:  Health Maintenance  Topic Date Due   COVID-19 Vaccine (1) Never done   HIV Screening  Never done   Pneumococcal Vaccination (1 of 2 - PCV) Never done   Eye exam for diabetics  11/29/2022   Complete foot exam   02/07/2023   Yearly kidney health urinalysis for diabetes  10/22/2023   Hemoglobin A1C  03/09/2024   Flu Shot  05/20/2024   Mammogram  05/23/2024   Yearly kidney function blood test for diabetes  09/09/2024   Colon Cancer Screening  12/06/2024   Pap with HPV screening  10/25/2027   DTaP/Tdap/Td vaccine (2 - Td or Tdap) 02/28/2031   Hepatitis C Screening  Completed   Zoster (Shingles) Vaccine  Completed    HPV Vaccine  Aged Out   Meningitis B Vaccine  Aged Out     If you have any questions or concerns, please do not hesitate to call the office at [phone].  I look forward to our next visit and until then take care and stay safe.  Regards,   Valli Gaw, MD   [facility]

## 2024-02-17 ENCOUNTER — Encounter: Payer: Self-pay | Admitting: Family Medicine

## 2024-02-18 ENCOUNTER — Other Ambulatory Visit: Payer: Self-pay | Admitting: Nurse Practitioner

## 2024-02-18 DIAGNOSIS — J029 Acute pharyngitis, unspecified: Secondary | ICD-10-CM

## 2024-02-18 MED ORDER — METHYLPREDNISOLONE 4 MG PO TBPK: ORAL_TABLET | ORAL | 0 refills | Status: DC

## 2024-02-21 ENCOUNTER — Encounter: Payer: Self-pay | Admitting: Family Medicine

## 2024-02-21 DIAGNOSIS — B9789 Other viral agents as the cause of diseases classified elsewhere: Secondary | ICD-10-CM | POA: Insufficient documentation

## 2024-02-21 NOTE — Assessment & Plan Note (Signed)
 Symptoms suggest viral etiology. No bacterial infection. Discussed low-dose steroids if no improvement, but current improvement suggests waiting. Explained steroid risks, including hyperglycemia due to diabetes. - Advise regular voice use, avoid whispering. - Consider low-dose steroid if no improvement by Friday. Monitor blood sugar if initiated.

## 2024-02-21 NOTE — Assessment & Plan Note (Signed)
 Postnasal drip causing throat irritation and cough. Discussed nasal spray use to alleviate symptoms. - Start Flonase  nasal spray, two sprays each nostril twice daily. - Start Astelin  nasal spray, two sprays each nostril twice daily. - Continue levocetirizine at night. - Use humidifier for laryngitis.

## 2024-02-23 ENCOUNTER — Other Ambulatory Visit: Payer: Self-pay | Admitting: Family

## 2024-02-23 DIAGNOSIS — J309 Allergic rhinitis, unspecified: Secondary | ICD-10-CM

## 2024-03-10 ENCOUNTER — Ambulatory Visit (INDEPENDENT_AMBULATORY_CARE_PROVIDER_SITE_OTHER): Payer: Medicaid Other | Admitting: Nurse Practitioner

## 2024-03-10 VITALS — BP 100/68 | HR 85 | Temp 98.3°F | Ht 59.0 in | Wt 163.8 lb

## 2024-03-10 DIAGNOSIS — F419 Anxiety disorder, unspecified: Secondary | ICD-10-CM | POA: Diagnosis not present

## 2024-03-10 DIAGNOSIS — I152 Hypertension secondary to endocrine disorders: Secondary | ICD-10-CM | POA: Diagnosis not present

## 2024-03-10 DIAGNOSIS — Z7985 Long-term (current) use of injectable non-insulin antidiabetic drugs: Secondary | ICD-10-CM

## 2024-03-10 DIAGNOSIS — E119 Type 2 diabetes mellitus without complications: Secondary | ICD-10-CM

## 2024-03-10 DIAGNOSIS — E1159 Type 2 diabetes mellitus with other circulatory complications: Secondary | ICD-10-CM

## 2024-03-10 DIAGNOSIS — F32A Depression, unspecified: Secondary | ICD-10-CM

## 2024-03-10 DIAGNOSIS — M4802 Spinal stenosis, cervical region: Secondary | ICD-10-CM

## 2024-03-10 DIAGNOSIS — E785 Hyperlipidemia, unspecified: Secondary | ICD-10-CM | POA: Diagnosis not present

## 2024-03-10 LAB — COMPREHENSIVE METABOLIC PANEL WITH GFR
ALT: 23 U/L (ref 0–35)
AST: 19 U/L (ref 0–37)
Albumin: 4.4 g/dL (ref 3.5–5.2)
Alkaline Phosphatase: 48 U/L (ref 39–117)
BUN: 11 mg/dL (ref 6–23)
CO2: 32 meq/L (ref 19–32)
Calcium: 9.3 mg/dL (ref 8.4–10.5)
Chloride: 102 meq/L (ref 96–112)
Creatinine, Ser: 0.88 mg/dL (ref 0.40–1.20)
GFR: 73.95 mL/min (ref >60.00)
Glucose, Bld: 115 mg/dL — ABNORMAL HIGH (ref 70–99)
Potassium: 3.9 meq/L (ref 3.5–5.1)
Sodium: 141 meq/L (ref 135–145)
Total Bilirubin: 0.4 mg/dL (ref 0.2–1.2)
Total Protein: 7 g/dL (ref 6.0–8.3)

## 2024-03-10 LAB — LIPID PANEL
Cholesterol: 178 mg/dL (ref 0–200)
HDL: 50.1 mg/dL (ref >39.00)
LDL Cholesterol: 85 mg/dL (ref 0–99)
NonHDL: 127.57
Total CHOL/HDL Ratio: 4
Triglycerides: 214 mg/dL — ABNORMAL HIGH (ref 0.0–149.0)
VLDL: 42.8 mg/dL — ABNORMAL HIGH (ref 0.0–40.0)

## 2024-03-10 LAB — MICROALBUMIN / CREATININE URINE RATIO
Creatinine,U: 199.1 mg/dL
Microalb Creat Ratio: 5.8 mg/g (ref 0.0–30.0)
Microalb, Ur: 1.1 mg/dL (ref 0.0–1.9)

## 2024-03-10 LAB — HEMOGLOBIN A1C: Hgb A1c MFr Bld: 6.1 % (ref 4.6–6.5)

## 2024-03-10 MED ORDER — LORAZEPAM 0.5 MG PO TABS: 0.5000 mg | ORAL_TABLET | Freq: Two times a day (BID) | ORAL | 1 refills | Status: DC | PRN

## 2024-03-10 NOTE — Progress Notes (Unsigned)
 Angela Burkitt, NP-C Phone: [phone]  Angela Simon is a 58 y.o. female who presents today for follow up.   Discussed the use of AI scribe software for clinical note transcription with the patient, who gave verbal consent to proceed.  History of Present Illness   Angela Simon January is a 58 year old female with anxiety and hypertension who presents for a six-month follow-up visit.  She has been experiencing significant stress and anxiety due to multiple family losses over the past two years, including the deaths of her father, mother, stepfather, and aunt. She feels overwhelmed and describes it as 'like I'm losing my mind.' She is currently seeing a counselor for support and is scheduled to visit after this appointment.  She is taking Prozac  for anxiety and finds it moderately effective. She occasionally uses Xanax, provided by her sister, for acute anxiety episodes, but it does not provide significant relief. She has previously taken Ativan  and is familiar with its effects.  She is on losartan  and hydrochlorothiazide  for hypertension and reports her blood pressure is on the low end. She experiences occasional dizziness, which she attributes to a neck issue involving degenerative disc disease and bulging discs. She is awaiting a consultation for potential neck surgery.  She has a history of gastroparesis and IBS, which she manages with a nightly cleanse pill. She experienced significant relief from gastrointestinal symptoms while on a recent course of prednisone. She is cautious about adjusting her Mounjaro  dosage due to concerns about exacerbating her gastroparesis.  She monitors her blood sugar levels, which are generally stable, with occasional readings in the 80s and 90s. She has not experienced low blood sugar symptoms.  She reports a persistent issue with her voice following a recent illness, which improved with prednisone but has not fully resolved. She continues to use cough  drops to manage symptoms.      Social History   Tobacco Use  Smoking Status Never  Smokeless Tobacco Never    Current Outpatient Medications on File Prior to Visit  Medication Sig Dispense Refill   bifidobacterium infantis (ALIGN) capsule Take by mouth.     estradiol (VIVELLE-DOT) 0.0375 MG/24HR Place onto the skin.     FLUoxetine  (PROZAC ) 20 MG capsule Take 1 capsule (20 mg total) by mouth daily. D/c tablet 90 capsule 3   fluticasone  (FLONASE ) 50 MCG/ACT nasal spray Place 2 sprays into both nostrils in the morning and at bedtime. 16 g 1   levocetirizine (XYZAL ) 5 MG tablet TAKE 1 TABLET BY MOUTH AT BEDTIME AS NEEDED FOR ALLERGIES 30 tablet 11   losartan -hydrochlorothiazide  (HYZAAR) 50-12.5 MG tablet Take 1 tablet by mouth daily. 90 tablet 3   Multiple Vitamin (MULTIVITAMIN) tablet Take 1 tablet by mouth daily.     Multiple Vitamins-Minerals (MULTIVITAMIN WOMEN 50+ PO) Take 1 tablet by mouth daily.     Omega-3 1000 MG CAPS Take by mouth.     pantoprazole  (PROTONIX ) 40 MG tablet Take 1 tablet (40 mg total) by mouth daily. 30 minutes before food 90 tablet 3   pravastatin  (PRAVACHOL ) 20 MG tablet Take 1 tablet (20 mg total) by mouth daily. After 6 pm 90 tablet 3   tirzepatide  (MOUNJARO ) 5 MG/0.5ML Pen Inject 5 mg into the skin once a week. 2 mL 1   No current facility-administered medications on file prior to visit.     ROS see history of present illness  Objective  Physical Exam Vitals:   03/10/24 0857  BP: 100/68  Pulse: 85  Temp: 98.3 F (36.8 C)  SpO2: 94%    BP Readings from Last 3 Encounters:  03/10/24 100/68  02/15/24 110/70  10/07/23 100/68   Wt Readings from Last 3 Encounters:  03/10/24 163 lb 12.8 oz (74.3 kg)  02/15/24 162 lb 4 oz (73.6 kg)  10/07/23 160 lb 6.4 oz (72.8 kg)    Physical Exam Constitutional:      General: She is not in acute distress.    Appearance: Normal appearance.  HENT:     Head: Normocephalic.  Cardiovascular:     Rate and  Rhythm: Normal rate and regular rhythm.     Heart sounds: Normal heart sounds.  Pulmonary:     Effort: Pulmonary effort is normal.     Breath sounds: Normal breath sounds.  Skin:    General: Skin is warm and dry.  Neurological:     General: No focal deficit present.     Mental Status: She is alert.  Psychiatric:        Mood and Affect: Mood normal.        Behavior: Behavior normal.     Assessment/Plan: Please see individual problem list.  Anxiety and depression Assessment & Plan: Stressors have exacerbated her anxiety. She is currently on Prozac  20 mg daily. Ativan  is preferred for acute episodes and short term use. Prescribe Ativan  for acute anxiety episodes and continue Prozac  as prescribed. PDMP reviewed. Counseled on common side effects. Continue regular visits with therapist. We will continue to monitor.   Orders: -     LORazepam ; Take 1 tablet (0.5 mg total) by mouth 2 (two) times daily as needed for anxiety.  Dispense: 30 tablet; Refill: 1  Cervical spinal stenosis Assessment & Plan: MRI shows significant progression with neck pain and potential vagus nerve involvement. A surgical consultation is scheduled for June 4th. Follow up with Neurosurgery as scheduled.    Type 2 diabetes mellitus without complication, without long-term current use of insulin (HCC) Assessment & Plan: Diabetes is well controlled with Mounjaro  5 mg weekly. Continue. We will check A1c today and delay further increase in dosage until after pending neck surgery. Encourage healthy diet and regular exercise.   Orders: -     Microalbumin / creatinine urine ratio -     Hemoglobin A1c  Hyperlipidemia, unspecified hyperlipidemia type Assessment & Plan: Her hyperlipidemia is managed with Pravastatin . Continue Pravastatin  20 mg daily. Check lipid panel today.   Orders: -     Lipid panel  Hypertension associated with diabetes (HCC) Assessment & Plan: Her hypertension is well-controlled with  losartan -hydrochlorothiazide . She has no symptoms of hypotension. Continue losartan -hydrochlorothiazide  50-12.5 and monitor for hypotension symptoms. Check CMP today.  Orders: -     Comprehensive metabolic panel with GFR     Return in about 6 months (around 09/10/2024) for Follow up.   Angela Burkitt, NP-C [facility]

## 2024-03-11 ENCOUNTER — Ambulatory Visit: Payer: Self-pay | Admitting: Nurse Practitioner

## 2024-03-17 ENCOUNTER — Encounter: Payer: Self-pay | Admitting: Nurse Practitioner

## 2024-03-17 DIAGNOSIS — M4802 Spinal stenosis, cervical region: Secondary | ICD-10-CM | POA: Insufficient documentation

## 2024-03-17 NOTE — Assessment & Plan Note (Addendum)
 Her hypertension is well-controlled with losartan -hydrochlorothiazide . She has no symptoms of hypotension. Continue losartan -hydrochlorothiazide  50-12.5 and monitor for hypotension symptoms. Check CMP today.

## 2024-03-17 NOTE — Assessment & Plan Note (Signed)
 Her hyperlipidemia is managed with Pravastatin . Continue Pravastatin  20 mg daily. Check lipid panel today.

## 2024-03-17 NOTE — Assessment & Plan Note (Signed)
 MRI shows significant progression with neck pain and potential vagus nerve involvement. A surgical consultation is scheduled for June 4th. Follow up with Neurosurgery as scheduled.

## 2024-03-17 NOTE — Assessment & Plan Note (Signed)
 Diabetes is well controlled with Mounjaro  5 mg weekly. Continue. We will check A1c today and delay further increase in dosage until after pending neck surgery. Encourage healthy diet and regular exercise.

## 2024-03-17 NOTE — Assessment & Plan Note (Signed)
 Stressors have exacerbated her anxiety. She is currently on Prozac  20 mg daily. Ativan  is preferred for acute episodes and short term use. Prescribe Ativan  for acute anxiety episodes and continue Prozac  as prescribed. PDMP reviewed. Counseled on common side effects. Continue regular visits with therapist. We will continue to monitor.

## 2024-03-21 ENCOUNTER — Encounter: Payer: Self-pay | Admitting: Nurse Practitioner

## 2024-03-21 DIAGNOSIS — I10 Essential (primary) hypertension: Secondary | ICD-10-CM

## 2024-03-21 MED ORDER — LOSARTAN POTASSIUM-HCTZ 50-12.5 MG PO TABS: 1.0000 | ORAL_TABLET | Freq: Every day | ORAL | 3 refills | Status: AC

## 2024-03-23 DIAGNOSIS — M2578 Osteophyte, vertebrae: Secondary | ICD-10-CM | POA: Diagnosis not present

## 2024-03-23 DIAGNOSIS — I10 Essential (primary) hypertension: Secondary | ICD-10-CM | POA: Diagnosis not present

## 2024-03-23 DIAGNOSIS — M5412 Radiculopathy, cervical region: Secondary | ICD-10-CM | POA: Diagnosis not present

## 2024-03-23 DIAGNOSIS — M47812 Spondylosis without myelopathy or radiculopathy, cervical region: Secondary | ICD-10-CM | POA: Diagnosis not present

## 2024-03-23 DIAGNOSIS — M79602 Pain in left arm: Secondary | ICD-10-CM | POA: Diagnosis not present

## 2024-03-23 DIAGNOSIS — M4312 Spondylolisthesis, cervical region: Secondary | ICD-10-CM | POA: Diagnosis not present

## 2024-03-23 DIAGNOSIS — M79601 Pain in right arm: Secondary | ICD-10-CM | POA: Diagnosis not present

## 2024-03-23 DIAGNOSIS — M542 Cervicalgia: Secondary | ICD-10-CM | POA: Diagnosis not present

## 2024-03-29 ENCOUNTER — Encounter: Payer: Self-pay | Admitting: Nurse Practitioner

## 2024-03-30 ENCOUNTER — Other Ambulatory Visit: Payer: Self-pay | Admitting: Nurse Practitioner

## 2024-03-30 DIAGNOSIS — M4722 Other spondylosis with radiculopathy, cervical region: Secondary | ICD-10-CM | POA: Diagnosis not present

## 2024-03-30 DIAGNOSIS — E119 Type 2 diabetes mellitus without complications: Secondary | ICD-10-CM

## 2024-03-30 MED ORDER — TIRZEPATIDE 7.5 MG/0.5ML ~~LOC~~ SOAJ: 7.5000 mg | SUBCUTANEOUS | 2 refills | Status: DC

## 2024-04-13 ENCOUNTER — Encounter: Payer: Self-pay | Admitting: Nurse Practitioner

## 2024-04-13 DIAGNOSIS — M4722 Other spondylosis with radiculopathy, cervical region: Secondary | ICD-10-CM | POA: Diagnosis not present

## 2024-04-21 DIAGNOSIS — M4722 Other spondylosis with radiculopathy, cervical region: Secondary | ICD-10-CM | POA: Diagnosis not present

## 2024-04-26 DIAGNOSIS — M4722 Other spondylosis with radiculopathy, cervical region: Secondary | ICD-10-CM | POA: Diagnosis not present

## 2024-05-04 DIAGNOSIS — M4722 Other spondylosis with radiculopathy, cervical region: Secondary | ICD-10-CM | POA: Diagnosis not present

## 2024-05-09 DIAGNOSIS — M4722 Other spondylosis with radiculopathy, cervical region: Secondary | ICD-10-CM | POA: Diagnosis not present

## 2024-05-20 ENCOUNTER — Encounter: Payer: Self-pay | Admitting: Nurse Practitioner

## 2024-05-23 ENCOUNTER — Other Ambulatory Visit: Payer: Self-pay | Admitting: Nurse Practitioner

## 2024-05-23 DIAGNOSIS — K219 Gastro-esophageal reflux disease without esophagitis: Secondary | ICD-10-CM

## 2024-05-24 DIAGNOSIS — M4312 Spondylolisthesis, cervical region: Secondary | ICD-10-CM | POA: Diagnosis not present

## 2024-05-24 DIAGNOSIS — M4802 Spinal stenosis, cervical region: Secondary | ICD-10-CM | POA: Diagnosis not present

## 2024-05-24 DIAGNOSIS — Z981 Arthrodesis status: Secondary | ICD-10-CM | POA: Diagnosis not present

## 2024-05-24 DIAGNOSIS — M5021 Other cervical disc displacement,  high cervical region: Secondary | ICD-10-CM | POA: Diagnosis not present

## 2024-05-24 DIAGNOSIS — M5001 Cervical disc disorder with myelopathy,  high cervical region: Secondary | ICD-10-CM | POA: Diagnosis not present

## 2024-05-24 DIAGNOSIS — M50221 Other cervical disc displacement at C4-C5 level: Secondary | ICD-10-CM | POA: Diagnosis not present

## 2024-05-24 DIAGNOSIS — M50321 Other cervical disc degeneration at C4-C5 level: Secondary | ICD-10-CM | POA: Diagnosis not present

## 2024-05-24 DIAGNOSIS — M5030 Other cervical disc degeneration, unspecified cervical region: Secondary | ICD-10-CM | POA: Diagnosis not present

## 2024-05-24 DIAGNOSIS — M50322 Other cervical disc degeneration at C5-C6 level: Secondary | ICD-10-CM | POA: Diagnosis not present

## 2024-05-24 DIAGNOSIS — M50222 Other cervical disc displacement at C5-C6 level: Secondary | ICD-10-CM | POA: Diagnosis not present

## 2024-05-24 DIAGNOSIS — M2578 Osteophyte, vertebrae: Secondary | ICD-10-CM | POA: Diagnosis not present

## 2024-05-31 ENCOUNTER — Telehealth: Payer: Self-pay

## 2024-05-31 MED ORDER — TIRZEPATIDE 5 MG/0.5ML ~~LOC~~ SOAJ: 5.0000 mg | SUBCUTANEOUS | 5 refills | Status: DC

## 2024-05-31 NOTE — Telephone Encounter (Signed)
 Copied from CRM [id number]. Topic: Clinical - Medication Question >> May 31, 2024 11:14 AM Macario HERO wrote: Reason for CRM: Patient spouse called to ask if provider can lower her tirzepatide  (MOUNJARO ) 7.5 MG/0.5ML Pen [[id number]] dosage from 7.5 to 5.0mg ?

## 2024-05-31 NOTE — Addendum Note (Signed)
 Addended by: ORLANDO KINGDOM on: 05/31/2024 01:22 PM   Modules accepted: Orders

## 2024-06-07 ENCOUNTER — Encounter: Payer: Self-pay | Admitting: Nurse Practitioner

## 2024-06-07 DIAGNOSIS — Z30432 Encounter for removal of intrauterine contraceptive device: Secondary | ICD-10-CM | POA: Diagnosis not present

## 2024-06-07 DIAGNOSIS — N9089 Other specified noninflammatory disorders of vulva and perineum: Secondary | ICD-10-CM | POA: Diagnosis not present

## 2024-06-07 DIAGNOSIS — R102 Pelvic and perineal pain: Secondary | ICD-10-CM | POA: Diagnosis not present

## 2024-06-14 DIAGNOSIS — Z1231 Encounter for screening mammogram for malignant neoplasm of breast: Secondary | ICD-10-CM | POA: Diagnosis not present

## 2024-06-17 ENCOUNTER — Encounter: Payer: Self-pay | Admitting: Nurse Practitioner

## 2024-06-17 ENCOUNTER — Ambulatory Visit: Admitting: Nurse Practitioner

## 2024-06-17 VITALS — BP 124/76 | HR 74 | Temp 97.8°F | Ht 59.0 in | Wt 162.2 lb

## 2024-06-17 DIAGNOSIS — E119 Type 2 diabetes mellitus without complications: Secondary | ICD-10-CM

## 2024-06-17 DIAGNOSIS — F419 Anxiety disorder, unspecified: Secondary | ICD-10-CM | POA: Diagnosis not present

## 2024-06-17 DIAGNOSIS — F32A Depression, unspecified: Secondary | ICD-10-CM | POA: Diagnosis not present

## 2024-06-17 DIAGNOSIS — M4802 Spinal stenosis, cervical region: Secondary | ICD-10-CM

## 2024-06-17 DIAGNOSIS — Z79890 Hormone replacement therapy (postmenopausal): Secondary | ICD-10-CM | POA: Diagnosis not present

## 2024-06-17 DIAGNOSIS — K5909 Other constipation: Secondary | ICD-10-CM | POA: Diagnosis not present

## 2024-06-17 NOTE — Progress Notes (Signed)
 Leron Glance, NP-C Phone: [phone]  Angela Simon is a 58 y.o. female who presents today for lower abdominal pain.   Discussed the use of AI scribe software for clinical note transcription with the patient, who gave verbal consent to proceed.  History of Present Illness   Angela Simon is a 58 year old female who presents with gastrointestinal issues and post-surgical follow-up.  She has been experiencing ongoing gastrointestinal issues, including constipation and cramping, exacerbated by the use of a colon cleanser for over a year. Bowel movements were 'pure liquid' when using the cleanser, and more formed since discontinuing it. She attributes lower abdominal cramping and difficulty with bowel movements to the use of Mounjaro  for blood sugar control. She has a history of IBS and has been tested for gastroparesis. She reports that she did have some spotting after IUD removal but has not had any further spotting or significant pelvic symptoms as described by her gynecologist.  She underwent neck surgery three weeks ago for spinal stenosis, which involved the removal of bone spurs and the insertion of a titanium plate with screws. Post-surgery, she feels less tingling and pain but experiences some swallowing difficulties and soreness. There is an improvement in gastrointestinal symptoms post-surgery.  She is currently on Mounjaro  for blood sugar management, having resumed it after a break for surgery. Initial constipation with Mounjaro  was noted, but there is improvement in blood sugar levels. She is also on estradiol and progesterone as part of hormone therapy, following the removal of her IUD.  She has a history of anxiety, particularly related to health concerns, and is currently using Ativan  as needed. She reports a high heart rate historically, attributed to anxiety, but notes a normal heart rate during this visit.  She has a family history of breast cancer, with her sister  having undergone multiple surgeries for the condition. She recently had a mammogram which returned normal results.      Social History   Tobacco Use  Smoking Status Never  Smokeless Tobacco Never    Current Outpatient Medications on File Prior to Visit  Medication Sig Dispense Refill   bifidobacterium infantis (ALIGN) capsule Take by mouth.     estradiol (VIVELLE-DOT) 0.0375 MG/24HR Place onto the skin.     FLUoxetine  (PROZAC ) 20 MG capsule Take 1 capsule (20 mg total) by mouth daily. D/c tablet 90 capsule 3   levocetirizine (XYZAL ) 5 MG tablet TAKE 1 TABLET BY MOUTH AT BEDTIME AS NEEDED FOR ALLERGIES 30 tablet 11   LORazepam  (ATIVAN ) 0.5 MG tablet Take 1 tablet (0.5 mg total) by mouth 2 (two) times daily as needed for anxiety. 30 tablet 1   losartan -hydrochlorothiazide  (HYZAAR) 50-12.5 MG tablet Take 1 tablet by mouth daily. 90 tablet 3   Multiple Vitamin (MULTIVITAMIN) tablet Take 1 tablet by mouth daily.     Multiple Vitamins-Minerals (MULTIVITAMIN WOMEN 50+ PO) Take 1 tablet by mouth daily.     Omega-3 1000 MG CAPS Take by mouth.     pantoprazole  (PROTONIX ) 40 MG tablet TAKE 1 TABLET BY MOUTH ONCE DAILY (TAKE  30  MINUTES  BEFORE  FOOD) 90 tablet 0   pravastatin  (PRAVACHOL ) 20 MG tablet Take 1 tablet (20 mg total) by mouth daily. After 6 pm 90 tablet 3   tirzepatide  (MOUNJARO ) 5 MG/0.5ML Pen Inject 5 mg into the skin once a week. 2 mL 5   No current facility-administered medications on file prior to visit.     ROS see history of present  illness  Objective  Physical Exam Vitals:   06/17/24 1038  BP: 124/76  Pulse: 74  Temp: 97.8 F (36.6 C)  SpO2: 94%    BP Readings from Last 3 Encounters:  06/17/24 124/76  03/10/24 100/68  02/15/24 110/70   Wt Readings from Last 3 Encounters:  06/17/24 162 lb 3.2 oz (73.6 kg)  03/10/24 163 lb 12.8 oz (74.3 kg)  02/15/24 162 lb 4 oz (73.6 kg)    Physical Exam Constitutional:      General: She is not in acute distress.     Appearance: Normal appearance.  HENT:     Head: Normocephalic.  Cardiovascular:     Rate and Rhythm: Normal rate and regular rhythm.     Heart sounds: Normal heart sounds.  Pulmonary:     Effort: Pulmonary effort is normal.     Breath sounds: Normal breath sounds.  Skin:    General: Skin is warm and dry.  Neurological:     General: No focal deficit present.     Mental Status: She is alert.  Psychiatric:        Mood and Affect: Mood normal.        Behavior: Behavior normal.      Assessment/Plan: Please see individual problem list.  Type 2 diabetes mellitus without complication, without long-term current use of insulin (HCC) Assessment & Plan: Blood glucose has improved with Mounjaro  5 mg weekly. She prefers to maintain the current dose due to gastrointestinal concerns and recent surgery. Continue Mounjaro  at 5 mg weekly. Consider increasing to 7.5 mg if needed. We will continue to monitor.    Cervical spinal stenosis Assessment & Plan: Post-operative recovery is ongoing with some swallowing issues and soreness, but overall symptom improvement is noted. Follow up with the surgeon at the end of September.   Anxiety and depression Assessment & Plan: Anxiety is related to health and personal stressors. Ativan  is effective, and she is attending counseling. Anxiety is exacerbated by recent family losses and changes. Continue Ativan  as needed and counseling sessions. Depression is managed with fluoxetine . Her mood is not well-managed due to situational stress. Continue fluoxetine  as prescribed. Encouraged to contact if worsening symptoms, unusual behavior changes or suicidal thoughts occur.    Chronic constipation Assessment & Plan: Constipation is worsened by Mounjaro . Previous colon cleanser use led to symptoms, but discontinuation improved bowel movements. She reports cramping and difficulty. Consider starting Miralax or Colace. Evaluate the need for imaging if symptoms persist or  worsen.   Postmenopausal HRT (hormone replacement therapy) Assessment & Plan: She is transitioning off the estradiol patch and progesterone. Currently using an estradiol patch due to a pharmacy mix-up. Spotting occurred post-IUD removal, and biopsy is deferred due to anxiety. Continue the current estradiol patch at 0.05 mg until the pharmacy issue is resolved. Plan to taper estradiol as per previous instructions. Follow up with OBGYN for vulvar lesion biopsy.       Return for as scheduled.   Leron Glance, NP-C [facility]

## 2024-06-28 DIAGNOSIS — N901 Moderate vulvar dysplasia: Secondary | ICD-10-CM | POA: Diagnosis not present

## 2024-06-28 DIAGNOSIS — N9089 Other specified noninflammatory disorders of vulva and perineum: Secondary | ICD-10-CM | POA: Diagnosis not present

## 2024-07-01 ENCOUNTER — Encounter: Payer: Self-pay | Admitting: Nurse Practitioner

## 2024-07-01 DIAGNOSIS — Z79890 Hormone replacement therapy (postmenopausal): Secondary | ICD-10-CM | POA: Insufficient documentation

## 2024-07-01 DIAGNOSIS — K5909 Other constipation: Secondary | ICD-10-CM | POA: Insufficient documentation

## 2024-07-01 NOTE — Assessment & Plan Note (Signed)
 Post-operative recovery is ongoing with some swallowing issues and soreness, but overall symptom improvement is noted. Follow up with the surgeon at the end of September.

## 2024-07-01 NOTE — Assessment & Plan Note (Addendum)
 Blood glucose has improved with Mounjaro  5 mg weekly. She prefers to maintain the current dose due to gastrointestinal concerns and recent surgery. Continue Mounjaro  at 5 mg weekly. Consider increasing to 7.5 mg if needed. We will continue to monitor.

## 2024-07-01 NOTE — Assessment & Plan Note (Signed)
 Constipation is worsened by Mounjaro . Previous colon cleanser use led to symptoms, but discontinuation improved bowel movements. She reports cramping and difficulty. Consider starting Miralax or Colace. Evaluate the need for imaging if symptoms persist or worsen.

## 2024-07-01 NOTE — Assessment & Plan Note (Signed)
 Anxiety is related to health and personal stressors. Ativan  is effective, and she is attending counseling. Anxiety is exacerbated by recent family losses and changes. Continue Ativan  as needed and counseling sessions. Depression is managed with fluoxetine . Her mood is not well-managed due to situational stress. Continue fluoxetine  as prescribed. Encouraged to contact if worsening symptoms, unusual behavior changes or suicidal thoughts occur.

## 2024-07-01 NOTE — Assessment & Plan Note (Signed)
 She is transitioning off the estradiol patch and progesterone. Currently using an estradiol patch due to a pharmacy mix-up. Spotting occurred post-IUD removal, and biopsy is deferred due to anxiety. Continue the current estradiol patch at 0.05 mg until the pharmacy issue is resolved. Plan to taper estradiol as per previous instructions. Follow up with OBGYN for vulvar lesion biopsy.

## 2024-07-04 DIAGNOSIS — C519 Malignant neoplasm of vulva, unspecified: Secondary | ICD-10-CM | POA: Diagnosis not present

## 2024-07-08 DIAGNOSIS — M4802 Spinal stenosis, cervical region: Secondary | ICD-10-CM | POA: Diagnosis not present

## 2024-07-08 DIAGNOSIS — Z9889 Other specified postprocedural states: Secondary | ICD-10-CM | POA: Diagnosis not present

## 2024-07-08 DIAGNOSIS — Z981 Arthrodesis status: Secondary | ICD-10-CM | POA: Diagnosis not present

## 2024-07-30 ENCOUNTER — Other Ambulatory Visit: Payer: Self-pay | Admitting: Nurse Practitioner

## 2024-07-30 DIAGNOSIS — K219 Gastro-esophageal reflux disease without esophagitis: Secondary | ICD-10-CM

## 2024-08-09 ENCOUNTER — Encounter: Payer: Self-pay | Admitting: Nurse Practitioner

## 2024-08-15 ENCOUNTER — Encounter: Payer: Self-pay | Admitting: Nurse Practitioner

## 2024-08-15 DIAGNOSIS — K219 Gastro-esophageal reflux disease without esophagitis: Secondary | ICD-10-CM

## 2024-08-15 MED ORDER — PANTOPRAZOLE SODIUM 40 MG PO TBEC: 40.0000 mg | DELAYED_RELEASE_TABLET | Freq: Every day | ORAL | 3 refills | Status: DC

## 2024-08-22 DIAGNOSIS — D071 Carcinoma in situ of vulva: Secondary | ICD-10-CM | POA: Diagnosis not present

## 2024-09-01 DIAGNOSIS — D225 Melanocytic nevi of trunk: Secondary | ICD-10-CM | POA: Diagnosis not present

## 2024-09-01 DIAGNOSIS — D2261 Melanocytic nevi of right upper limb, including shoulder: Secondary | ICD-10-CM | POA: Diagnosis not present

## 2024-09-01 DIAGNOSIS — Z86006 Personal history of melanoma in-situ: Secondary | ICD-10-CM | POA: Diagnosis not present

## 2024-09-01 DIAGNOSIS — D2262 Melanocytic nevi of left upper limb, including shoulder: Secondary | ICD-10-CM | POA: Diagnosis not present

## 2024-09-01 DIAGNOSIS — D2272 Melanocytic nevi of left lower limb, including hip: Secondary | ICD-10-CM | POA: Diagnosis not present

## 2024-09-01 DIAGNOSIS — Z85820 Personal history of malignant melanoma of skin: Secondary | ICD-10-CM | POA: Diagnosis not present

## 2024-09-07 ENCOUNTER — Other Ambulatory Visit (HOSPITAL_COMMUNITY)
Admission: RE | Admit: 2024-09-07 | Discharge: 2024-09-07 | Disposition: A | Discharge status: Home / Self Care | Source: Ambulatory Visit | Attending: Nurse Practitioner | Admitting: Nurse Practitioner

## 2024-09-07 ENCOUNTER — Encounter: Payer: Self-pay | Admitting: Nurse Practitioner

## 2024-09-07 ENCOUNTER — Ambulatory Visit: Admitting: Nurse Practitioner

## 2024-09-07 VITALS — BP 116/78 | HR 81 | Temp 98.1°F | Ht 59.0 in | Wt 159.4 lb

## 2024-09-07 DIAGNOSIS — E1159 Type 2 diabetes mellitus with other circulatory complications: Secondary | ICD-10-CM | POA: Diagnosis not present

## 2024-09-07 DIAGNOSIS — F419 Anxiety disorder, unspecified: Secondary | ICD-10-CM | POA: Diagnosis not present

## 2024-09-07 DIAGNOSIS — Z23 Encounter for immunization: Secondary | ICD-10-CM | POA: Diagnosis not present

## 2024-09-07 DIAGNOSIS — K5909 Other constipation: Secondary | ICD-10-CM

## 2024-09-07 DIAGNOSIS — N898 Other specified noninflammatory disorders of vagina: Secondary | ICD-10-CM

## 2024-09-07 DIAGNOSIS — F32A Depression, unspecified: Secondary | ICD-10-CM

## 2024-09-07 DIAGNOSIS — E119 Type 2 diabetes mellitus without complications: Secondary | ICD-10-CM

## 2024-09-07 DIAGNOSIS — I152 Hypertension secondary to endocrine disorders: Secondary | ICD-10-CM | POA: Diagnosis not present

## 2024-09-07 DIAGNOSIS — K589 Irritable bowel syndrome without diarrhea: Secondary | ICD-10-CM

## 2024-09-07 DIAGNOSIS — Z7985 Long-term (current) use of injectable non-insulin antidiabetic drugs: Secondary | ICD-10-CM

## 2024-09-07 DIAGNOSIS — Z79890 Hormone replacement therapy (postmenopausal): Secondary | ICD-10-CM | POA: Diagnosis not present

## 2024-09-07 LAB — URINALYSIS, ROUTINE W REFLEX MICROSCOPIC
Bilirubin Urine: NEGATIVE
Hgb urine dipstick: NEGATIVE
Ketones, ur: NEGATIVE
Leukocytes,Ua: NEGATIVE
Nitrite: NEGATIVE
Specific Gravity, Urine: 1.015 (ref 1.000–1.030)
Total Protein, Urine: NEGATIVE
Urine Glucose: NEGATIVE
Urobilinogen, UA: 0.2 (ref 0.0–1.0)
pH: 7.5 (ref 5.0–8.0)

## 2024-09-07 LAB — COMPREHENSIVE METABOLIC PANEL WITH GFR
ALT: 21 U/L (ref 0–35)
AST: 19 U/L (ref 0–37)
Albumin: 4.6 g/dL (ref 3.5–5.2)
Alkaline Phosphatase: 49 U/L (ref 39–117)
BUN: 13 mg/dL (ref 6–23)
CO2: 33 meq/L — ABNORMAL HIGH (ref 19–32)
Calcium: 9.8 mg/dL (ref 8.4–10.5)
Chloride: 99 meq/L (ref 96–112)
Creatinine, Ser: 0.74 mg/dL (ref 0.40–1.20)
GFR: 90.72 mL/min (ref >60.00)
Glucose, Bld: 96 mg/dL (ref 70–99)
Potassium: 4.2 meq/L (ref 3.5–5.1)
Sodium: 138 meq/L (ref 135–145)
Total Bilirubin: 0.5 mg/dL (ref 0.2–1.2)
Total Protein: 7.1 g/dL (ref 6.0–8.3)

## 2024-09-07 LAB — HEMOGLOBIN A1C: Hgb A1c MFr Bld: 5.9 % (ref 4.6–6.5)

## 2024-09-07 LAB — TESTOSTERONE: Testosterone: 33.12 ng/dL (ref 15.00–40.00)

## 2024-09-07 MED ORDER — LORAZEPAM 0.5 MG PO TABS: 0.5000 mg | ORAL_TABLET | Freq: Two times a day (BID) | ORAL | 2 refills | Status: AC | PRN

## 2024-09-07 MED ORDER — DICYCLOMINE HCL 10 MG PO CAPS: 10.0000 mg | ORAL_CAPSULE | Freq: Three times a day (TID) | ORAL | 2 refills | Status: AC

## 2024-09-07 NOTE — Progress Notes (Unsigned)
 Leron Glance, NP-C Phone: [phone]  Angela Simon is a 58 y.o. female who presents today for follow up.   Discussed the use of AI scribe software for clinical note transcription with the patient, who gave verbal consent to proceed.  History of Present Illness   Angela Simon is a 58 year old female who presents for follow-up and management of multiple health concerns.  She experiences ongoing neck pain following recent neck surgery, described as a different kind of hurt with muscle tightness. Muscle relaxers provide limited relief and may worsen her gastroparesis symptoms. She avoids ibuprofen due to concerns about its effects on her surgical recovery.  She has a history of gastroparesis with recent exacerbation of symptoms, including bloating and gas after consuming certain foods like noodles. She manages her symptoms with daily Miralax and occasionally uses expired dicyclomine  for cramping. She has previously tried Reglan with adverse effects.  She underwent a gynecological procedure following the removal of her overdue IUD. A biopsy revealed precancerous cells, leading to surgical intervention to remove the affected area. She reports post-surgical itching and burning, suspecting a possible yeast infection, and describes the incision site as slightly red and irritated.  She is on hormone replacement therapy with an estrogen patch (0.05 mg) and has experienced cramping and sleep disturbances. She wants to have her hormone levels checked, particularly testosterone , due to symptoms like fatigue and decreased libido.  She experiences sleep disturbances, often taking a long time to fall asleep and sometimes using Ativan  or chamomile tea to aid sleep. She reports waking up sweating at night when her estrogen dose was reduced. She is exploring over-the-counter options like melatonin and Z-Quil to improve her sleep quality.  She is currently on Mounjaro  5 mg for blood sugar management  and reports variable morning blood sugar levels, sometimes as high as 140 mg/dL.  She takes losartan  and hydrochlorothiazide  for blood pressure management and reports no issues with chest pain or shortness of breath.      Social History   Tobacco Use  Smoking Status Never  Smokeless Tobacco Never    Current Outpatient Medications on File Prior to Visit  Medication Sig Dispense Refill   estradiol (VIVELLE-DOT) 0.05 MG/24HR patch 1 patch 2 (two) times a week.     levocetirizine (XYZAL ) 5 MG tablet TAKE 1 TABLET BY MOUTH AT BEDTIME AS NEEDED FOR ALLERGIES 30 tablet 11   losartan -hydrochlorothiazide  (HYZAAR) 50-12.5 MG tablet Take 1 tablet by mouth daily. 90 tablet 3   Multiple Vitamin (MULTIVITAMIN) tablet Take 1 tablet by mouth daily.     Multiple Vitamins-Minerals (MULTIVITAMIN WOMEN 50+ PO) Take 1 tablet by mouth daily.     Omega-3 1000 MG CAPS Take by mouth.     pantoprazole  (PROTONIX ) 40 MG tablet Take 1 tablet (40 mg total) by mouth daily. 90 tablet 3   pravastatin  (PRAVACHOL ) 20 MG tablet Take 1 tablet (20 mg total) by mouth daily. After 6 pm 90 tablet 3   tirzepatide  (MOUNJARO ) 5 MG/0.5ML Pen Inject 5 mg into the skin once a week. 2 mL 5   No current facility-administered medications on file prior to visit.     ROS see history of present illness  Objective  Physical Exam Vitals:   09/07/24 0854  BP: 116/78  Pulse: 81  Temp: 98.1 F (36.7 C)  SpO2: 98%    BP Readings from Last 3 Encounters:  09/07/24 116/78  06/17/24 124/76  03/10/24 100/68   Wt Readings from Last 3 Encounters:  09/07/24 159 lb 6.4 oz (72.3 kg)  06/17/24 162 lb 3.2 oz (73.6 kg)  03/10/24 163 lb 12.8 oz (74.3 kg)    Physical Exam Constitutional:      General: She is not in acute distress.    Appearance: Normal appearance.  HENT:     Head: Normocephalic.  Cardiovascular:     Rate and Rhythm: Normal rate and regular rhythm.     Heart sounds: Normal heart sounds.  Pulmonary:     Effort:  Pulmonary effort is normal.     Breath sounds: Normal breath sounds.  Skin:    General: Skin is warm and dry.  Neurological:     General: No focal deficit present.     Mental Status: She is alert.  Psychiatric:        Mood and Affect: Mood normal.        Behavior: Behavior normal.      Assessment/Plan: Please see individual problem list.  Type 2 diabetes mellitus without complication, without long-term current use of insulin (HCC) Assessment & Plan: Blood glucose levels range from 120 to 140 mg/dL, higher in the morning. Current treatment with Mounjaro  5 mg weekly is maintained. Consider increasing to 7.5 mg for better regulation and potential weight loss. Side effects, including fatigue post-injection, were discussed. Continue Mounjaro  5 mg until the current supply is finished, then increase to 7.5 mg. Regularly monitor blood glucose levels. Check A1c. Continue healthy diet and regular exercise.   Orders: -     Hemoglobin A1c  Vaginal itching Assessment & Plan: Recent excision of a vulvar precancerous lesion with clear margins. Possible vulvovaginal candidiasis indicated by itching and burning. A swab was performed to check for yeast infection. UA and culture pending. Further work up pending results.   Orders: -     Urinalysis, Routine w reflex microscopic -     Cervicovaginal ancillary only -     Urine Culture  Irritable bowel syndrome, unspecified type Assessment & Plan: She experiences gas and bloating, possibly due to diet. Miralax is effective, but Reglan is not tolerated. Bentyl  was discussed for cramping and gas relief. Continue Miralax daily and prescribe Bentyl . Consider dietary modifications to reduce gas and bloating. We will continue to monitor.   Orders: -     Dicyclomine  HCl; Take 1 capsule (10 mg total) by mouth 4 (four) times daily -  before meals and at bedtime.  Dispense: 120 capsule; Refill: 2  Anxiety and depression Assessment & Plan: Anxiety is related  to health and personal stressors. Ativan  is effective, and she is attending counseling. Anxiety is exacerbated by recent family losses and changes. Increase Ativan  prescription to twice daily as needed for anxiety. She has difficulty falling asleep, anxiety may contribute. Start melatonin, to be taken two hours before bedtime. Depression is managed with fluoxetine . Continue fluoxetine  as prescribed. Encouraged to contact if worsening symptoms, unusual behavior changes or suicidal thoughts occur.   Orders: -     LORazepam ; Take 1 tablet (0.5 mg total) by mouth 2 (two) times daily as needed for anxiety or sleep.  Dispense: 60 tablet; Refill: 2  Hypertension associated with diabetes (HCC) Assessment & Plan: Her hypertension is well-controlled with losartan -hydrochlorothiazide . She has no symptoms of hypotension. Continue losartan -hydrochlorothiazide  50-12.5 and monitor for hypotension symptoms. Check CMP today.  Orders: -     Comprehensive metabolic panel with GFR  Postmenopausal HRT (hormone replacement therapy) Assessment & Plan: She is on an estrogen patch 0.05 mg. Previous reduction attempts increased symptoms.  Updated hormone replacement therapy guidelines were discussed. Continue estrogen patch 0.05 mg. Checked testosterone  levels to assess for potential hormone replacement needs.  Orders: -     Testosterone   Need for influenza vaccination -     Flu vaccine trivalent PF, 6mos and older(Flulaval,Afluria,Fluarix,Fluzone)      Return in about 6 months (around 03/07/2025) for Follow up.   Leron Glance, NP-C [facility] - [facility]

## 2024-09-08 LAB — URINE CULTURE
MICRO NUMBER:: 17256771
Result:: NO GROWTH
SPECIMEN QUALITY:: ADEQUATE

## 2024-09-12 ENCOUNTER — Ambulatory Visit: Payer: Self-pay | Admitting: Nurse Practitioner

## 2024-09-12 LAB — CERVICOVAGINAL ANCILLARY ONLY
Bacterial Vaginitis (gardnerella): NEGATIVE
Candida Glabrata: NEGATIVE
Candida Vaginitis: NEGATIVE
Comment: NEGATIVE
Comment: NEGATIVE
Comment: NEGATIVE

## 2024-09-19 ENCOUNTER — Encounter: Payer: Self-pay | Admitting: Nurse Practitioner

## 2024-09-19 DIAGNOSIS — F32A Depression, unspecified: Secondary | ICD-10-CM

## 2024-09-19 MED ORDER — FLUOXETINE HCL 20 MG PO CAPS: 20.0000 mg | ORAL_CAPSULE | Freq: Every day | ORAL | 3 refills | Status: AC

## 2024-09-21 ENCOUNTER — Encounter: Payer: Self-pay | Admitting: Nurse Practitioner

## 2024-09-21 DIAGNOSIS — N898 Other specified noninflammatory disorders of vagina: Secondary | ICD-10-CM | POA: Insufficient documentation

## 2024-09-21 NOTE — Assessment & Plan Note (Addendum)
 She experiences gas and bloating, possibly due to diet. Miralax is effective, but Reglan is not tolerated. Bentyl  was discussed for cramping and gas relief. Continue Miralax daily and prescribe Bentyl . Consider dietary modifications to reduce gas and bloating. We will continue to monitor.

## 2024-09-21 NOTE — Assessment & Plan Note (Signed)
 Anxiety is related to health and personal stressors. Ativan  is effective, and she is attending counseling. Anxiety is exacerbated by recent family losses and changes. Increase Ativan  prescription to twice daily as needed for anxiety. She has difficulty falling asleep, anxiety may contribute. Start melatonin, to be taken two hours before bedtime. Depression is managed with fluoxetine . Continue fluoxetine  as prescribed. Encouraged to contact if worsening symptoms, unusual behavior changes or suicidal thoughts occur.

## 2024-09-21 NOTE — Assessment & Plan Note (Signed)
 She is on an estrogen patch 0.05 mg. Previous reduction attempts increased symptoms. Updated hormone replacement therapy guidelines were discussed. Continue estrogen patch 0.05 mg. Checked testosterone  levels to assess for potential hormone replacement needs.

## 2024-09-21 NOTE — Assessment & Plan Note (Signed)
 Blood glucose levels range from 120 to 140 mg/dL, higher in the morning. Current treatment with Mounjaro  5 mg weekly is maintained. Consider increasing to 7.5 mg for better regulation and potential weight loss. Side effects, including fatigue post-injection, were discussed. Continue Mounjaro  5 mg until the current supply is finished, then increase to 7.5 mg. Regularly monitor blood glucose levels. Check A1c. Continue healthy diet and regular exercise.

## 2024-09-21 NOTE — Assessment & Plan Note (Signed)
 Her hypertension is well-controlled with losartan -hydrochlorothiazide . She has no symptoms of hypotension. Continue losartan -hydrochlorothiazide  50-12.5 and monitor for hypotension symptoms. Check CMP today.

## 2024-09-21 NOTE — Assessment & Plan Note (Signed)
 Recent excision of a vulvar precancerous lesion with clear margins. Possible vulvovaginal candidiasis indicated by itching and burning. A swab was performed to check for yeast infection. UA and culture pending. Further work up pending results.

## 2024-10-11 ENCOUNTER — Other Ambulatory Visit: Payer: Self-pay | Admitting: Nurse Practitioner

## 2024-10-11 DIAGNOSIS — R110 Nausea: Secondary | ICD-10-CM

## 2024-10-11 MED ORDER — ONDANSETRON HCL 4 MG PO TABS: 4.0000 mg | ORAL_TABLET | Freq: Three times a day (TID) | ORAL | 5 refills | Status: AC | PRN

## 2024-10-24 ENCOUNTER — Encounter: Payer: Self-pay | Admitting: Nurse Practitioner

## 2024-10-24 DIAGNOSIS — E785 Hyperlipidemia, unspecified: Secondary | ICD-10-CM

## 2024-10-24 DIAGNOSIS — E119 Type 2 diabetes mellitus without complications: Secondary | ICD-10-CM

## 2024-10-24 DIAGNOSIS — I152 Hypertension secondary to endocrine disorders: Secondary | ICD-10-CM

## 2024-10-24 DIAGNOSIS — Z1211 Encounter for screening for malignant neoplasm of colon: Secondary | ICD-10-CM

## 2024-10-24 DIAGNOSIS — K219 Gastro-esophageal reflux disease without esophagitis: Secondary | ICD-10-CM

## 2024-10-24 MED ORDER — PANTOPRAZOLE SODIUM 40 MG PO TBEC: 40.0000 mg | DELAYED_RELEASE_TABLET | Freq: Two times a day (BID) | ORAL | 3 refills | Status: AC

## 2024-10-24 MED ORDER — PRAVASTATIN SODIUM 20 MG PO TABS: 20.0000 mg | ORAL_TABLET | Freq: Every day | ORAL | 3 refills | Status: AC

## 2024-10-24 NOTE — Telephone Encounter (Signed)
 Also colonoscopy needing to be placed dont see it last one was 2 years ago

## 2024-11-02 ENCOUNTER — Other Ambulatory Visit: Payer: Self-pay | Admitting: Nurse Practitioner

## 2024-11-02 DIAGNOSIS — N62 Hypertrophy of breast: Secondary | ICD-10-CM

## 2024-11-03 ENCOUNTER — Other Ambulatory Visit: Payer: Self-pay

## 2024-11-03 ENCOUNTER — Telehealth: Payer: Self-pay

## 2024-11-03 DIAGNOSIS — Z1211 Encounter for screening for malignant neoplasm of colon: Secondary | ICD-10-CM

## 2024-11-03 MED ORDER — PEG 3350-KCL-NA BICARB-NACL 420 G PO SOLR: 4000.0000 mL | Freq: Once | ORAL | 0 refills | Status: AC

## 2024-11-03 NOTE — Telephone Encounter (Signed)
 Gastroenterology Pre-Procedure Review  Request Date: 11/25/24 Requesting Physician: Dr. Theophilus  PATIENT REVIEW QUESTIONS: The patient responded to the following health history questions as indicated:    1. Are you having any GI issues? yes (GERD.  An office visit has been scheduled to see Dr. Theophilus 11/22/24 ) 2. Do you have a personal history of Polyps? no 3. Do you have a family history of Colon Cancer or Polyps? no 4. Diabetes Mellitus? yes (mounjaro  injections has been advised to stop 7 days prior) 5. Joint replacements in the past 12 months?no 6. Major health problems in the past 3 months?no 7. Any artificial heart valves, MVP, or defibrillator?no    MEDICATIONS & ALLERGIES:    Patient reports the following regarding taking any anticoagulation/antiplatelet therapy:   Plavix, Coumadin, Eliquis, Xarelto, Lovenox, Pradaxa, Brilinta, or Effient? no Aspirin? no  Patient confirms/reports the following medications:  Current Outpatient Medications  Medication Sig Dispense Refill   dicyclomine  (BENTYL ) 10 MG capsule Take 1 capsule (10 mg total) by mouth 4 (four) times daily -  before meals and at bedtime. 120 capsule 2   estradiol (VIVELLE-DOT) 0.05 MG/24HR patch 1 patch 2 (two) times a week.     FLUoxetine  (PROZAC ) 20 MG capsule Take 1 capsule (20 mg total) by mouth daily. D/c tablet 90 capsule 3   levocetirizine (XYZAL ) 5 MG tablet TAKE 1 TABLET BY MOUTH AT BEDTIME AS NEEDED FOR ALLERGIES 30 tablet 11   LORazepam  (ATIVAN ) 0.5 MG tablet Take 1 tablet (0.5 mg total) by mouth 2 (two) times daily as needed for anxiety or sleep. 60 tablet 2   losartan -hydrochlorothiazide  (HYZAAR) 50-12.5 MG tablet Take 1 tablet by mouth daily. 90 tablet 3   Multiple Vitamin (MULTIVITAMIN) tablet Take 1 tablet by mouth daily.     Multiple Vitamins-Minerals (MULTIVITAMIN WOMEN 50+ PO) Take 1 tablet by mouth daily.     Omega-3 1000 MG CAPS Take by mouth.     ondansetron  (ZOFRAN ) 4 MG tablet Take 1 tablet  (4 mg total) by mouth every 8 (eight) hours as needed for nausea or vomiting. 30 tablet 5   pantoprazole  (PROTONIX ) 40 MG tablet Take 1 tablet (40 mg total) by mouth 2 (two) times daily. 180 tablet 3   pravastatin  (PRAVACHOL ) 20 MG tablet Take 1 tablet (20 mg total) by mouth daily. After 6 pm 90 tablet 3   tirzepatide  (MOUNJARO ) 5 MG/0.5ML Pen Inject 5 mg into the skin once a week. 2 mL 5   No current facility-administered medications for this visit.    Patient confirms/reports the following allergies:  Allergies[1]  No orders of the defined types were placed in this encounter.   AUTHORIZATION INFORMATION Primary Insurance: 1D#: Group #:  Secondary Insurance: 1D#: Group #:  SCHEDULE INFORMATION: Date: 11/25/24 Time: Location: [facility]    [1]  Allergies Allergen Reactions   Metformin  Other (See Comments)    Diarrhea Diarrhea Diarrhea    Metformin  And Related     Diarrhea    Metoclopramide Other (See Comments)    Did not work fatigue, weight gain, increased anxiety   Ozempic  (0.25 Or 0.5 Mg-Dose) [Semaglutide (0.25 Or 0.5mg -Dos)]     Gastroparesis like sx's

## 2024-11-09 NOTE — Addendum Note (Signed)
 Addended by: ORLANDO KINGDOM on: 11/09/2024 03:27 PM   Modules accepted: Orders

## 2024-11-10 MED ORDER — TIRZEPATIDE 7.5 MG/0.5ML ~~LOC~~ SOAJ: 7.5000 mg | SUBCUTANEOUS | 1 refills | Status: AC

## 2024-11-10 NOTE — Addendum Note (Signed)
 Addended by: GRETEL APP on: 11/10/2024 02:34 PM   Modules accepted: Orders

## 2024-11-16 ENCOUNTER — Telehealth: Payer: Self-pay | Admitting: *Deleted

## 2024-11-16 DIAGNOSIS — E1159 Type 2 diabetes mellitus with other circulatory complications: Secondary | ICD-10-CM

## 2024-11-16 DIAGNOSIS — E785 Hyperlipidemia, unspecified: Secondary | ICD-10-CM

## 2024-11-16 DIAGNOSIS — E119 Type 2 diabetes mellitus without complications: Secondary | ICD-10-CM

## 2024-11-16 NOTE — Progress Notes (Signed)
 Complex Care Management Note  Care Guide Note 11/16/2024 Name: Kallyn Demarcus MRN: [id number] DOB: [birth year]  Cathline Dowen is a 59 y.o. year old female who sees Gretel App, NP for primary care. I reached out to Eleanor Kate Search by phone today to offer complex care management services.  Ms. Clarkston was given information about Complex Care Management services today including:   The Complex Care Management services include support from the care team which includes your Nurse Care Manager, Clinical Social Worker, or Pharmacist.  The Complex Care Management team is here to help remove barriers to the health concerns and goals most important to you. Complex Care Management services are voluntary, and the patient may decline or stop services at any time by request to their care team member.   Complex Care Management Consent Status: Patient agreed to services and verbal consent obtained.   Follow up plan:  Telephone appointment with complex care management team member scheduled for:  12/26/24  Encounter Outcome:  Patient Scheduled  Harlene Satterfield  [facility]  [facility], [facility] Guide  Direct Dial: [phone]  Fax [phone]

## 2024-11-22 ENCOUNTER — Ambulatory Visit

## 2024-11-22 ENCOUNTER — Telehealth: Payer: Self-pay

## 2024-11-22 NOTE — Telephone Encounter (Signed)
 The patients husband called to confirm that his wifes appointment was canceled. He requested to reschedule her colonoscopy for March, as they will be out of town this month and recent weather conditions have caused schedule changes. A message was sent to [facility] to contact the patient for rescheduling

## 2024-12-26 ENCOUNTER — Telehealth

## 2025-01-02 ENCOUNTER — Ambulatory Visit

## 2025-01-06 ENCOUNTER — Ambulatory Visit: Admission: RE | Admit: 2025-01-06 | Source: Home / Self Care

## 2025-01-06 ENCOUNTER — Encounter: Admission: RE | Payer: Self-pay | Source: Home / Self Care

## 2025-01-13 ENCOUNTER — Institutional Professional Consult (permissible substitution): Admitting: Plastic Surgery

## 2025-03-08 ENCOUNTER — Ambulatory Visit: Admitting: Nurse Practitioner
